# Patient Record
Sex: Female | Born: 1950
Health system: Southern US, Community
[De-identification: ages and names within clinical notes are randomized; demographics above are authoritative.]

## PROBLEM LIST (undated history)

## (undated) DIAGNOSIS — G5600 Carpal tunnel syndrome, unspecified upper limb: Secondary | ICD-10-CM

## (undated) DIAGNOSIS — M899 Disorder of bone, unspecified: Secondary | ICD-10-CM

## (undated) DIAGNOSIS — B009 Herpesviral infection, unspecified: Secondary | ICD-10-CM

## (undated) DIAGNOSIS — C880 Waldenstrom macroglobulinemia: Secondary | ICD-10-CM

## (undated) DIAGNOSIS — R05 Cough: Secondary | ICD-10-CM

## (undated) DIAGNOSIS — M949 Disorder of cartilage, unspecified: Secondary | ICD-10-CM

## (undated) DIAGNOSIS — L989 Disorder of the skin and subcutaneous tissue, unspecified: Secondary | ICD-10-CM

## (undated) DIAGNOSIS — R209 Unspecified disturbances of skin sensation: Secondary | ICD-10-CM

## (undated) HISTORY — DX: Waldenstrom macroglobulinemia: C88.0

## (undated) HISTORY — DX: Disorder of bone, unspecified: M89.9

## (undated) HISTORY — DX: Unspecified disturbances of skin sensation: R20.9

## (undated) HISTORY — DX: Carpal tunnel syndrome, unspecified upper limb: G56.00

## (undated) HISTORY — DX: Cough: R05

## (undated) HISTORY — DX: Disorder of cartilage, unspecified: M94.9

## (undated) HISTORY — DX: Herpesviral infection, unspecified: B00.9

## (undated) HISTORY — DX: Disorder of the skin and subcutaneous tissue, unspecified: L98.9

---
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
## 1986-12-30 HISTORY — PX: ORIF TIBIA & FIBULA FRACTURES: SHX2131

## 2001-10-07 ENCOUNTER — Encounter: Payer: Self-pay | Admitting: Internal Medicine

## 2003-03-10 ENCOUNTER — Other Ambulatory Visit: Admission: RE | Admit: 2003-03-10 | Discharge: 2003-03-10 | Payer: Self-pay | Admitting: *Deleted

## 2003-12-20 ENCOUNTER — Encounter: Payer: Self-pay | Admitting: Internal Medicine

## 2004-04-26 ENCOUNTER — Encounter: Payer: Self-pay | Admitting: Internal Medicine

## 2004-04-26 ENCOUNTER — Other Ambulatory Visit: Admission: RE | Admit: 2004-04-26 | Discharge: 2004-04-26 | Payer: Self-pay | Admitting: *Deleted

## 2005-06-03 ENCOUNTER — Other Ambulatory Visit: Admission: RE | Admit: 2005-06-03 | Discharge: 2005-06-03 | Payer: Self-pay | Admitting: Obstetrics and Gynecology

## 2006-02-18 ENCOUNTER — Ambulatory Visit: Payer: Self-pay | Admitting: Internal Medicine

## 2006-11-27 ENCOUNTER — Ambulatory Visit: Payer: Self-pay | Admitting: Internal Medicine

## 2006-12-16 ENCOUNTER — Encounter (INDEPENDENT_AMBULATORY_CARE_PROVIDER_SITE_OTHER): Payer: Self-pay | Admitting: *Deleted

## 2006-12-16 ENCOUNTER — Ambulatory Visit: Payer: Self-pay | Admitting: Gastroenterology

## 2006-12-16 ENCOUNTER — Encounter: Payer: Self-pay | Admitting: Internal Medicine

## 2007-01-06 ENCOUNTER — Emergency Department (HOSPITAL_COMMUNITY): Admission: EM | Admit: 2007-01-06 | Discharge: 2007-01-06 | Payer: Self-pay | Admitting: Emergency Medicine

## 2007-06-03 ENCOUNTER — Ambulatory Visit: Payer: Self-pay | Admitting: Internal Medicine

## 2008-10-27 ENCOUNTER — Encounter: Payer: Self-pay | Admitting: Internal Medicine

## 2008-11-03 ENCOUNTER — Encounter: Payer: Self-pay | Admitting: Internal Medicine

## 2008-11-09 ENCOUNTER — Encounter: Payer: Self-pay | Admitting: Internal Medicine

## 2008-12-05 ENCOUNTER — Ambulatory Visit: Payer: Self-pay | Admitting: Internal Medicine

## 2008-12-05 DIAGNOSIS — M899 Disorder of bone, unspecified: Secondary | ICD-10-CM

## 2008-12-05 DIAGNOSIS — G5600 Carpal tunnel syndrome, unspecified upper limb: Secondary | ICD-10-CM

## 2008-12-05 DIAGNOSIS — M949 Disorder of cartilage, unspecified: Secondary | ICD-10-CM

## 2008-12-05 HISTORY — DX: Carpal tunnel syndrome, unspecified upper limb: G56.00

## 2008-12-05 HISTORY — DX: Disorder of bone, unspecified: M89.9

## 2008-12-05 LAB — CONVERTED CEMR LAB
Albumin: 4.1 g/dL (ref 3.5–5.2)
Basophils Absolute: 0 10*3/uL (ref 0.0–0.1)
Bilirubin, Direct: 0.1 mg/dL (ref 0.0–0.3)
CO2: 28 meq/L (ref 19–32)
Calcium: 9.6 mg/dL (ref 8.4–10.5)
Cholesterol: 180 mg/dL (ref 0–200)
Eosinophils Absolute: 0 10*3/uL (ref 0.0–0.7)
Glucose, Bld: 80 mg/dL (ref 70–99)
HCT: 38 % (ref 36.0–46.0)
Hemoglobin: 12.9 g/dL (ref 12.0–15.0)
LDL Cholesterol: 102 mg/dL — ABNORMAL HIGH (ref 0–99)
Lymphocytes Relative: 36.5 % (ref 12.0–46.0)
Monocytes Relative: 10.2 % (ref 3.0–12.0)
Potassium: 4 meq/L (ref 3.5–5.1)
RDW: 13.2 % (ref 11.5–14.6)
Sodium: 142 meq/L (ref 135–145)
Total Bilirubin: 0.7 mg/dL (ref 0.3–1.2)
Total CHOL/HDL Ratio: 2.5
VLDL: 6 mg/dL (ref 0–40)
Vitamin B-12: 354 pg/mL (ref 211–911)

## 2009-02-24 ENCOUNTER — Telehealth: Payer: Self-pay | Admitting: Internal Medicine

## 2009-03-27 ENCOUNTER — Ambulatory Visit: Payer: Self-pay | Admitting: Internal Medicine

## 2009-08-16 ENCOUNTER — Ambulatory Visit: Payer: Self-pay | Admitting: Internal Medicine

## 2009-08-21 ENCOUNTER — Telehealth: Payer: Self-pay | Admitting: Internal Medicine

## 2009-08-24 LAB — CONVERTED CEMR LAB: Vit D, 25-Hydroxy: 26 ng/mL — ABNORMAL LOW (ref 30–89)

## 2009-09-01 ENCOUNTER — Ambulatory Visit: Payer: Self-pay | Admitting: Internal Medicine

## 2009-09-01 DIAGNOSIS — R209 Unspecified disturbances of skin sensation: Secondary | ICD-10-CM

## 2009-09-01 HISTORY — DX: Unspecified disturbances of skin sensation: R20.9

## 2009-09-01 LAB — CONVERTED CEMR LAB: Vit D, 25-Hydroxy: 36 ng/mL (ref 30–89)

## 2009-09-06 LAB — CONVERTED CEMR LAB
Basophils Absolute: 0 10*3/uL (ref 0.0–0.1)
Folate: 14 ng/mL
Lymphocytes Relative: 37.8 % (ref 12.0–46.0)
MCHC: 33.9 g/dL (ref 30.0–36.0)
Neutro Abs: 2.6 10*3/uL (ref 1.4–7.7)
Platelets: 280 10*3/uL (ref 150.0–400.0)
RDW: 13.5 % (ref 11.5–14.6)
TSH: 0.81 microintl units/mL (ref 0.35–5.50)

## 2009-12-01 ENCOUNTER — Ambulatory Visit: Payer: Self-pay | Admitting: Family Medicine

## 2009-12-01 DIAGNOSIS — J019 Acute sinusitis, unspecified: Secondary | ICD-10-CM | POA: Insufficient documentation

## 2009-12-01 LAB — CONVERTED CEMR LAB: Rapid Strep: NEGATIVE

## 2009-12-04 ENCOUNTER — Telehealth: Payer: Self-pay | Admitting: Family Medicine

## 2010-02-14 ENCOUNTER — Telehealth: Payer: Self-pay | Admitting: Internal Medicine

## 2010-03-01 ENCOUNTER — Ambulatory Visit: Payer: Self-pay | Admitting: Internal Medicine

## 2010-03-01 DIAGNOSIS — B009 Herpesviral infection, unspecified: Secondary | ICD-10-CM

## 2010-03-01 HISTORY — DX: Herpesviral infection, unspecified: B00.9

## 2010-04-11 ENCOUNTER — Ambulatory Visit: Payer: Self-pay | Admitting: Internal Medicine

## 2010-04-11 DIAGNOSIS — J309 Allergic rhinitis, unspecified: Secondary | ICD-10-CM | POA: Insufficient documentation

## 2010-11-09 ENCOUNTER — Telehealth: Payer: Self-pay | Admitting: Internal Medicine

## 2011-01-20 ENCOUNTER — Encounter: Payer: Self-pay | Admitting: Obstetrics and Gynecology

## 2011-01-29 NOTE — Progress Notes (Signed)
Summary: rash on buttocks- rx please  Phone Note Call from Patient   Caller: Patient Call For: Birdie Sons MD Reason for Call: Talk to Nurse Summary of Call: called taken from pt - has rash on buttocks that she has had before - recurrent . Dr. Cato Mulligan has rx'd cream or pill  in the past - ?fungal Thinks she got it when she was in a sauna.  [facility] call pt back at [phone] Initial call taken by: Duard Brady LPN,  November 09, 2010 3:17 PM  Follow-up for Phone Call        I don't know what cream she is talking about., with new medication. Otherwise office visit. Follow-up by: Birdie Sons MD,  November 11, 2010 7:01 PM  Additional Follow-up for Phone Call Additional follow up Details #1::        l/m on pts cell phone with directions Additional Follow-up by: Alfred Levins, CMA,  November 12, 2010 11:40 AM

## 2011-01-29 NOTE — Assessment & Plan Note (Signed)
Summary: ear block/pressure/cjr   Vital Signs:  Patient profile:   60 year old female Weight:      154 pounds Temp:     97.7 degrees F oral BP sitting:   122 / 80  (right arm) Cuff size:   regular  Vitals Entered By: Duard Brady LPN (April 11, 2010 10:46 AM) CC: c/o both ears 'blocked' , congestion , dry cough  Is Patient Diabetic? No   CC:  c/o both ears 'blocked' , congestion , and dry cough .  History of Present Illness:  60 year old patient with a one-week history fullness and pressure in the ears associated with some diminished auditory acuity.  There is a minimal rhinorrhea, chest congestion, and nonproductive cough.  There's been no fever.  She states that she often has this symptom complex.  After visiting grandchildren.  Symptoms are aggravated by business flights. she has a history of osteopenia, otherwise, her past medical history is fairly unremarkable  Allergies: 1)  ! Ciprofloxacin Hcl (Ciprofloxacin Hcl)  Past History:  Past Medical History: Reviewed history from 03/01/2010 and no changes required.  Osteopenia recurrent herpes simplex  Review of Systems       The patient complains of decreased hearing.  The patient denies anorexia, fever, weight loss, weight gain, vision loss, hoarseness, chest pain, syncope, dyspnea on exertion, peripheral edema, prolonged cough, headaches, hemoptysis, abdominal pain, melena, hematochezia, severe indigestion/heartburn, hematuria, incontinence, genital sores, muscle weakness, suspicious skin lesions, transient blindness, difficulty walking, depression, unusual weight change, abnormal bleeding, enlarged lymph nodes, angioedema, and breast masses.    Physical Exam  General:  Well-developed,well-nourished,in no acute distress; alert,appropriate and cooperative throughout examination Head:  Normocephalic and atraumatic without obvious abnormalities. No apparent alopecia or balding. Eyes:  No corneal or conjunctival  inflammation noted. EOMI. Perrla. Funduscopic exam benign, without hemorrhages, exudates or papilledema. Vision grossly normal. Ears:  minimal wax in the canals Nose:  External nasal examination shows no deformity or inflammation. Nasal mucosa are pink and moist without lesions or exudates. Mouth:  Oral mucosa and oropharynx without lesions or exudates.  Teeth in good repair. Neck:  No deformities, masses, or tenderness noted. Lungs:  Normal respiratory effort, chest expands symmetrically. Lungs are clear to auscultation, no crackles or wheezes.   Impression & Recommendations:  Problem # 1:  RHINITIS (ICD-477.9)  Her updated medication list for this problem includes:    Fluticasone Propionate 50 Mcg/act Susp (Fluticasone propionate) ..... Used intranasally daily  Her updated medication list for this problem includes:    Fluticasone Propionate 50 Mcg/act Susp (Fluticasone propionate) ..... Used intranasally daily  Problem # 2:  OSTEOPENIA (ICD-733.90)  Complete Medication List: 1)  Centrum Silver Tabs (Multiple vitamins-minerals) .... Once daily 2)  [facility] Cal[facility]-vitamin D 500-200 Mg-unit Tabs (Calcium carbonate-vitamin d) .... Once daily 3)  Smz-tmp Ds 800-160 Mg Tabs (Sulfamethoxazole-trimethoprim) .... One twice daily 4)  Prednisone 20 Mg Tabs (Prednisone) .... One twice daily 5)  Fluticasone Propionate 50 Mcg/act Susp (Fluticasone propionate) .... Used intranasally daily 6)  Fexofenadine-pseudoephedrine 60-120 Mg Xr12h-tab (Fexofenadine-pseudoephedrine) .... One twice daily 7)  Sulfacetamide Sodium 10 % Soln (Sulfacetamide sodium) .... 2 gtts four times daily  Patient Instructions: 1)  Please schedule a follow-up appointment as needed. 2)  Take calcium +Vitamin D daily. Prescriptions: SULFACETAMIDE SODIUM 10 % SOLN (SULFACETAMIDE SODIUM) 2 gtts four times daily  #10 cc x 0   Entered and Authorized by:   Gordy Savers  MD   Signed by:   Gordy Savers  MD on 04/11/2010    Method used:   Print then Give to Patient   RxID:   [id number] FEXOFENADINE-PSEUDOEPHEDRINE 60-120 MG XR12H-TAB (FEXOFENADINE-PSEUDOEPHEDRINE) one twice daily  #20 x 4   Entered and Authorized by:   Gordy Savers  MD   Signed by:   Gordy Savers  MD on 04/11/2010   Method used:   [phone]       [address]       [place], [place]  [address]       Ph: [phone] or [phone]       Fax: [phone]   RxID:   [id number] FLUTICASONE PROPIONATE 50 MCG/ACT SUSP (FLUTICASONE PROPIONATE) used intranasally daily  #1 x 4   Entered and Authorized by:   Gordy Savers  MD   Signed by:   Gordy Savers  MD on 04/11/2010   Method used:   [facility]        [facility] [phone] ([facility])       [address] [place], [place]  [address]       Ph: [phone] or [phone]       Fax: [phone]   RxID:   [id number] PREDNISONE 20 MG TABS (PREDNISONE) one twice daily  #14 x 0   Entered and Authorized by:   Gordy Savers  MD   Signed by:   Gordy Savers  MD on 04/11/2010   Method used:   [facility]        [phone][facility] ([facility]ail[facility]       [address] [place], [place]  [address]       Ph: [phone] or [phone]       Fax: [phone]   RxID:   [id number]  And

## 2011-01-29 NOTE — Progress Notes (Signed)
Summary: tetanus question  Phone Note Call from Patient Call back at [phone]   Caller: Patient---live call Summary of Call: wants the nurse to call her back with her tetanus info. has ? Initial call taken by: Warnell Forester,  February 14, 2010 1:08 PM  Follow-up for Phone Call        Left message on machine. Pt to call back. Gladis Riffle, RN  February 14, 2010 2:16 PM   Checked paper chart and EMR and she is due tetanus according to those records.  Unable to reach pt and had left message to call back earlier today. Follow-up by: Gladis Riffle, RN,  February 14, 2010 4:22 PM  Additional Follow-up for Phone Call Additional follow up Details #1::        Told pt she is due tetanus at next visit unless she needs it sooner due to injury via voice mail message. Additional Follow-up by: Gladis Riffle, RN,  February 15, 2010 2:31 PM

## 2011-01-29 NOTE — Assessment & Plan Note (Signed)
Summary: rash/njr   Vital Signs:  Patient profile:   60 year old female Weight:      158 pounds Temp:     98.2 degrees F oral BP sitting:   110 / 70  (right arm) Cuff size:   regular  Vitals Entered By: Duard Brady LPN (March 02, 2951 12:54 PM) CC: travelling  out of country - immunizations? , rash on buttocks , cold sore on lip , would like abx to travel with   CC:  travelling  out of country - immunizations? , rash on buttocks , cold sore on lip , and would like abx to travel with.  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  She requires an immunization update.  she is unclear of her immunization history, which may be in the hands of her gynecologist.  Her EMR, and paper chart.  Reviewed  Allergies: 1)  ! Ciprofloxacin Hcl (Ciprofloxacin Hcl)  Past History:  Past Medical History:  Osteopenia recurrent herpes simplex  Physical Exam  General:  Well-developed,well-nourished,in no acute distress; alert,appropriate and cooperative throughout examination; normal blood pressure Skin:  resolving herpetic lesion, left buttock area   Impression & Recommendations:  Problem # 1:  PREVENTIVE HEALTH CARE (ICD-V70.0)  Problem # 2:  HSV (ICD-054.9)  Complete Medication List: 1)  Centrum Silver Tabs (Multiple vitamins-minerals) .... Once daily 2)  [facility]vitamin [facility] 500-200 Mg-unit Tabs (Calcium carbonate-vitamin d) .... Once daily 3)  Smz-tmp Ds 800-160 Mg Tabs (Sulfamethoxazole-trimethoprim) .... One twice daily  Other Orders: Hepatitis B Vaccine >6yrs (84132) Admin 1st Vaccine (44010)  Patient Instructions: 1)  Please schedule a follow-up appointment as needed. Prescriptions: SMZ-TMP DS 800-160 MG TABS (SULFAMETHOXAZOLE-TRIMETHOPRIM) one twice daily  #20 x 0   Entered and Authorized by:   Gordy Savers  MD   Signed by:   Gordy Savers  MD on 03/01/2010   Method used:   Print  then Give to Patient   RxID:   [id number]   Prevention & Chronic Care Immunizations   Influenza vaccine: Not documented    Tetanus booster: Not documented    Pneumococcal vaccine: Not documented    H. zoster vaccine: Not documented  Colorectal Screening   Hemoccult: given  (12/20/2003)   Hemoccult due: 12/19/2004    Colonoscopy: polyp  (12/16/2006)   Colonoscopy due: 12/17/2011  Other Screening   Pap smear: normal-pt's report  (10/30/2008)   Pap smear due: 10/30/2009    Mammogram: normal  (11/09/2008)   Mammogram due: 11/09/2009    DXA bone density scan: osteopenia (-1.1)  (11/03/2008)   DXA scan due: 10/2010    Smoking status: never  (12/05/2008)  Lipids   Total Cholesterol: 180  (12/05/2008)   LDL: 102  (12/05/2008)   LDL Direct: Not documented   HDL: 72.1  (12/05/2008)   Triglycerides: 28  (12/05/2008)   Immunizations Administered:  Hepatitis B Vaccine # 3:    Vaccine Type: HepB Adult    Site: left deltoid    Mfr: GlaxoSmithKline    Dose: 1.0 ml    Route: IM    Given by: Duard Brady LPN    Exp. Date: 03/02/2011    Lot #: [id number]    VIS given: 07/16/06 version given March 01, 2010.    Physician counseled: yes

## 2011-10-04 ENCOUNTER — Ambulatory Visit (INDEPENDENT_AMBULATORY_CARE_PROVIDER_SITE_OTHER): Payer: Managed Care, Other (non HMO) | Admitting: Internal Medicine

## 2011-10-04 ENCOUNTER — Encounter: Payer: Self-pay | Admitting: Internal Medicine

## 2011-10-04 ENCOUNTER — Ambulatory Visit (INDEPENDENT_AMBULATORY_CARE_PROVIDER_SITE_OTHER)
Admission: RE | Admit: 2011-10-04 | Discharge: 2011-10-04 | Disposition: A | Discharge status: Home / Self Care | Payer: Managed Care, Other (non HMO) | Source: Ambulatory Visit | Attending: Internal Medicine | Admitting: Internal Medicine

## 2011-10-04 ENCOUNTER — Other Ambulatory Visit: Payer: Self-pay | Admitting: Internal Medicine

## 2011-10-04 VITALS — BP 118/80 | Temp 99.2°F | Wt 149.0 lb

## 2011-10-04 DIAGNOSIS — J069 Acute upper respiratory infection, unspecified: Secondary | ICD-10-CM

## 2011-10-04 IMAGING — CR DG CHEST 2V
2 series · 2 of 2 positions shown · non-contrast
Comparison: None.

CLINICAL DATA: Cough, congestion

CHEST - 2 VIEW

[view not recorded (1 of 2)]
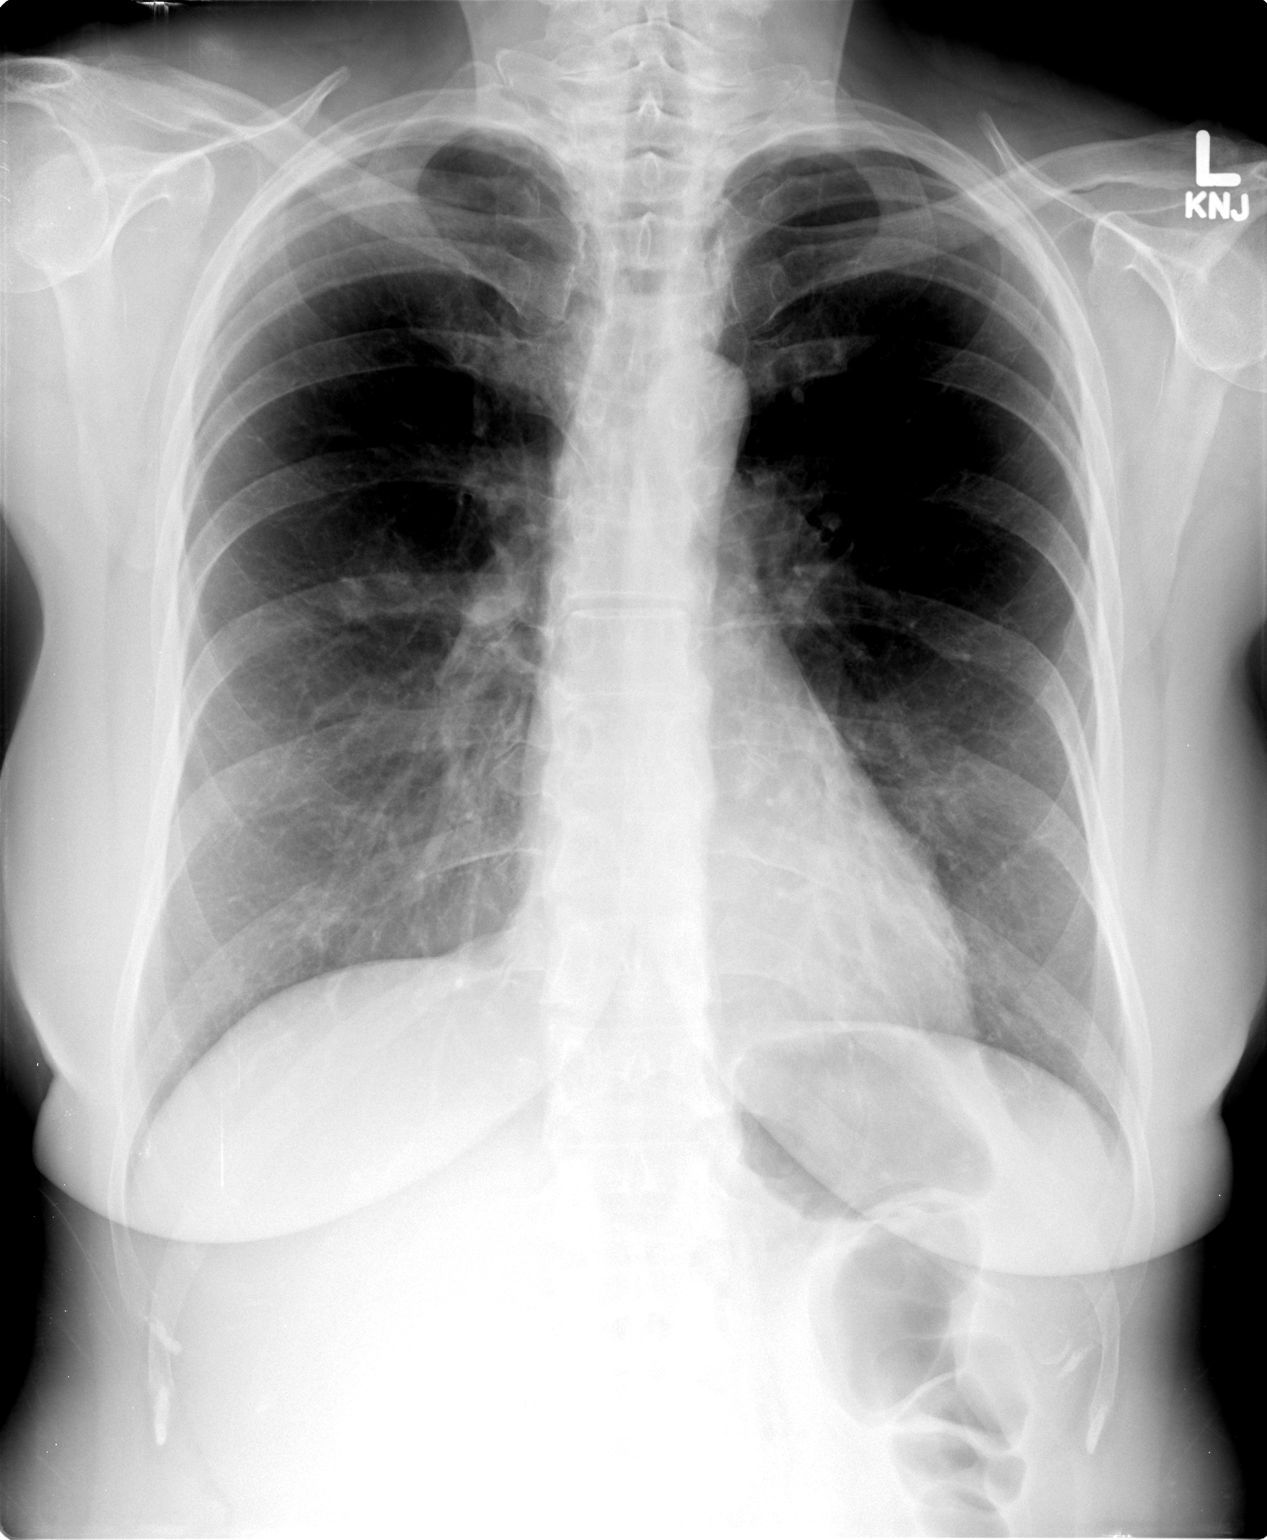

[view not recorded (2 of 2)]
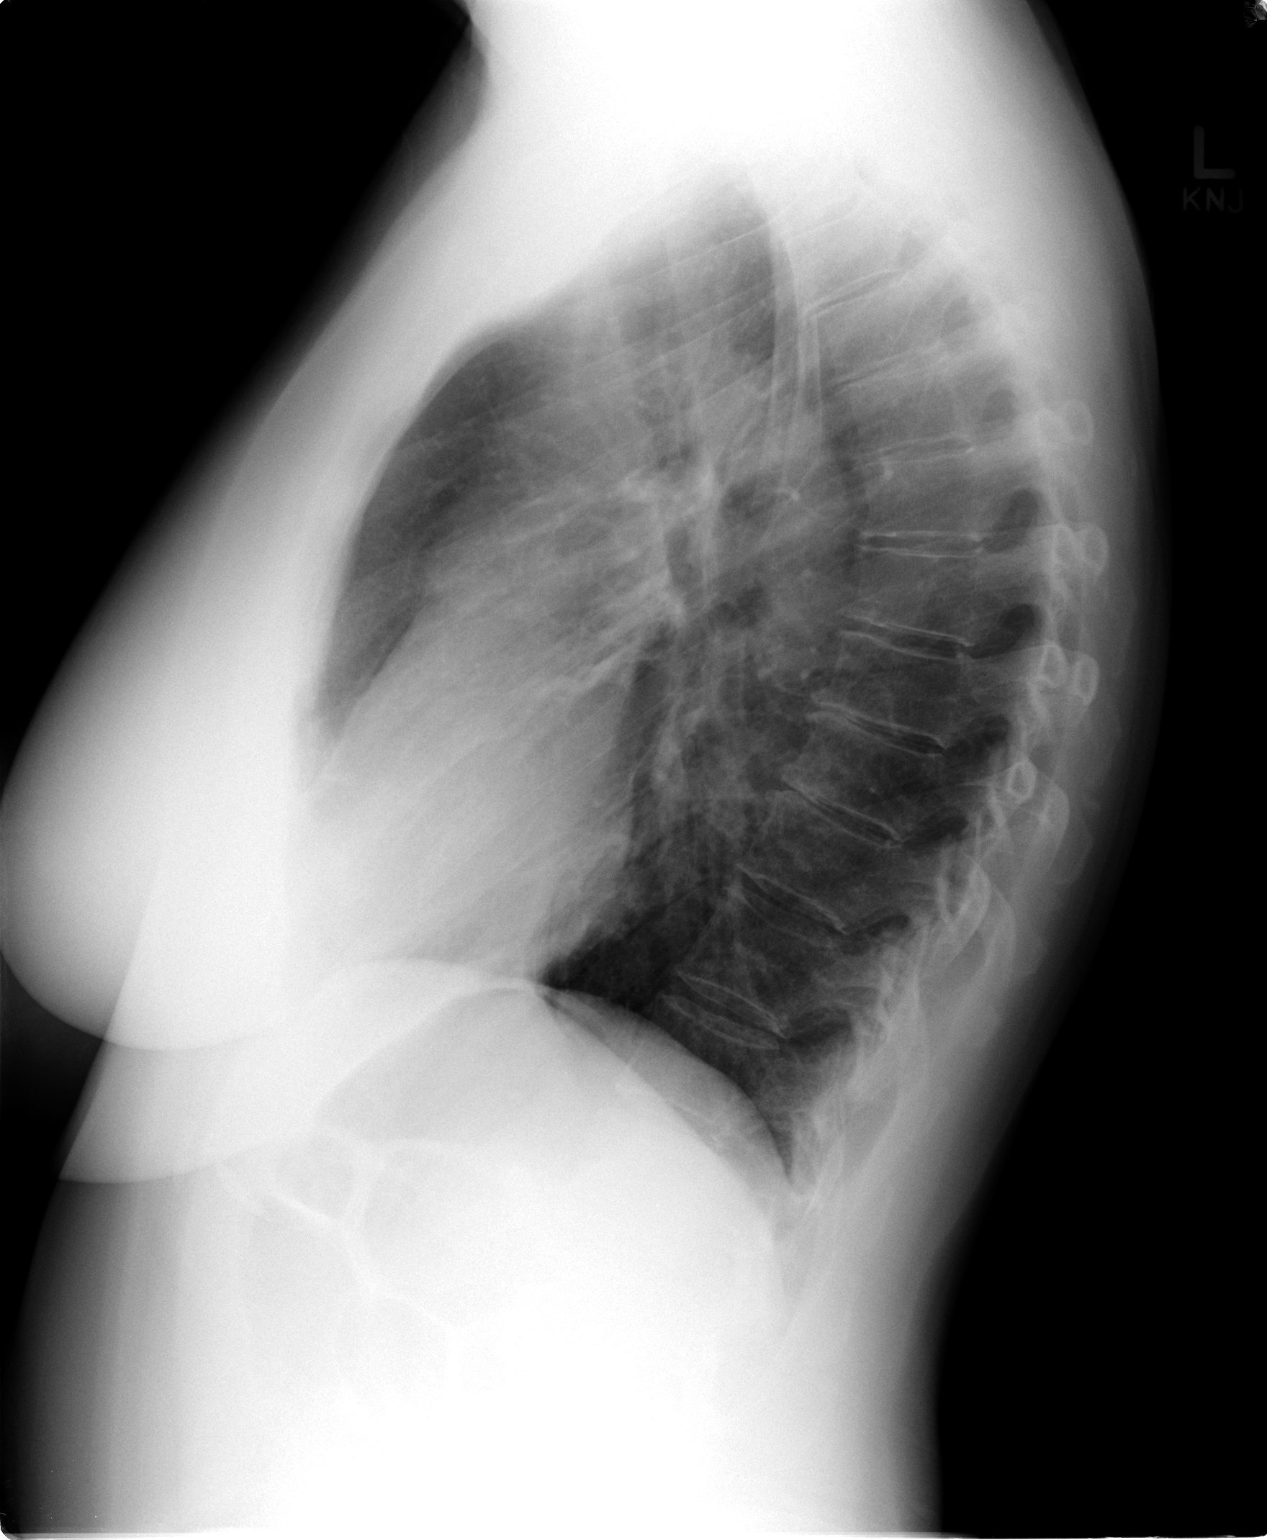

[2 of 2 positions shown; findings below may reference images not displayed]

FINDINGS: No definite pneumonia is seen, but there are minimally
prominent markings in the mid right lung field.  Patchy pneumonia
is difficult to exclude and follow up chest x-ray is recommended.
No effusion is seen.  Mediastinal contours are normal.  The heart
is within normal limits in size.  No acute bony abnormality is
seen.
IMPRESSION: Minimally prominent markings in the right mid lung field. Recommend
follow-up chest x-ray to exclude pneumonia.

## 2011-10-04 MED ORDER — HYDROCODONE-HOMATROPINE 5-1.5 MG/5ML PO SYRP: 5.0000 mL | ORAL_SOLUTION | Freq: Four times a day (QID) | ORAL | Status: AC | PRN

## 2011-10-04 NOTE — Progress Notes (Signed)
  Subjective:    Patient ID: Paula [name], female    DOB: 1951-07-29, 60 y.o.   MRN: [id number]  HPI  60 year old patient who is seen today with a six-day history of largely dry nonproductive cough she has had some fatigue and rare chills. She is very much concerned about recurrent upper spikes for tract infections. She states that she has episodes that occur once or twice per year. She does work and she often stays with several employees. At the present time her husband has a similar illness. Denies any chest pain shortness of breath wheezing or purulent sputum production. She is requesting a chest x-ray to rule out pneumonia. She is a lifelong nonsmoker   Review of Systems  Constitutional: Positive for fever, chills and fatigue.  HENT: Positive for congestion. Negative for hearing loss, sore throat, rhinorrhea, dental problem, sinus pressure and tinnitus.   Eyes: Negative for pain, discharge and visual disturbance.  Respiratory: Positive for cough. Negative for shortness of breath.   Cardiovascular: Negative for chest pain, palpitations and leg swelling.  Gastrointestinal: Negative for nausea, vomiting, abdominal pain, diarrhea, constipation, blood in stool and abdominal distention.  Genitourinary: Negative for dysuria, urgency, frequency, hematuria, flank pain, vaginal bleeding, vaginal discharge, difficulty urinating, vaginal pain and pelvic pain.  Musculoskeletal: Negative for joint swelling, arthralgias and gait problem.  Skin: Negative for rash.  Neurological: Negative for dizziness, syncope, speech difficulty, weakness, numbness and headaches.  Hematological: Negative for adenopathy.  Psychiatric/Behavioral: Negative for behavioral problems, dysphoric mood and agitation. The patient is not nervous/anxious.        Objective:   Physical Exam  Constitutional: She is oriented to person, place, and time. She appears well-developed and well-nourished. No distress.       Appears unwell  temperature 99.2. No acute distress  HENT:  Head: Normocephalic.  Right Ear: External ear normal.  Left Ear: External ear normal.  Mouth/Throat: Oropharynx is clear and moist.  Eyes: Conjunctivae and EOM are normal. Pupils are equal, round, and reactive to light.  Neck: Normal range of motion. Neck supple. No thyromegaly present.  Cardiovascular: Normal rate, regular rhythm, normal heart sounds and intact distal pulses.   Pulmonary/Chest: Effort normal and breath sounds normal.       O2 saturation 97% Pulse rate 78  Abdominal: Soft. Bowel sounds are normal. She exhibits no mass. There is no tenderness.  Musculoskeletal: Normal range of motion.  Lymphadenopathy:    She has no cervical adenopathy.  Neurological: She is alert and oriented to person, place, and time.  Skin: Skin is warm and dry. No rash noted.  Psychiatric: She has a normal mood and affect. Her behavior is normal.          Assessment & Plan:    Viral URI. Will treat symptomatically. We'll obtain a chest x-ray per patient request for reassurance and to rule out an unlikely pneumonia

## 2011-10-04 NOTE — Patient Instructions (Signed)
Get plenty of rest, Drink lots of  clear liquids, and use Tylenol or ibuprofen for fever and discomfort.    Call or return to clinic prn if these symptoms worsen or fail to improve as anticipated.  

## 2011-10-07 ENCOUNTER — Telehealth: Payer: Self-pay | Admitting: Internal Medicine

## 2011-10-07 NOTE — Telephone Encounter (Signed)
Pt  Requesting results of chest x-ray- pt request you to leave detailed message if she does not answer

## 2011-10-07 NOTE — Telephone Encounter (Signed)
Please advise 

## 2011-10-07 NOTE — Telephone Encounter (Signed)
i left message.

## 2011-10-07 NOTE — Telephone Encounter (Signed)
Pt called back to check on status of Chest Xray results. Pt was informed that nurse left a message, but pt said that no vm was lft on cell. Pt req that nurse call on work # [phone].

## 2011-10-08 ENCOUNTER — Ambulatory Visit (INDEPENDENT_AMBULATORY_CARE_PROVIDER_SITE_OTHER): Payer: Managed Care, Other (non HMO) | Admitting: Internal Medicine

## 2011-10-08 ENCOUNTER — Encounter: Payer: Self-pay | Admitting: Internal Medicine

## 2011-10-08 VITALS — BP 118/70 | Temp 98.0°F | Wt 150.0 lb

## 2011-10-08 DIAGNOSIS — J069 Acute upper respiratory infection, unspecified: Secondary | ICD-10-CM

## 2011-10-08 NOTE — Progress Notes (Signed)
  Subjective:    Patient ID: Paula [name], female    DOB: 1951/10/31, 60 y.o.   MRN: [id number]  HPI   60 year old patient who is seen today for followup. She was seen recently for a viral URI and has been treated symptomatically. At her request a chest x-ray was performed last week revealed some bronchitic changes only. She states that she still feels poorly she has some headache fatigue sore throat and mild nonproductive cough. She is a nonsmoker no fever or chills shortness of breath wheezing or chest pain    Review of Systems  Constitutional: Negative.   HENT: Positive for congestion and voice change. Negative for hearing loss, sore throat, rhinorrhea, dental problem, sinus pressure and tinnitus.   Eyes: Negative for pain, discharge and visual disturbance.  Respiratory: Positive for cough. Negative for shortness of breath.   Cardiovascular: Negative for chest pain, palpitations and leg swelling.  Gastrointestinal: Negative for nausea, vomiting, abdominal pain, diarrhea, constipation, blood in stool and abdominal distention.  Genitourinary: Negative for dysuria, urgency, frequency, hematuria, flank pain, vaginal bleeding, vaginal discharge, difficulty urinating, vaginal pain and pelvic pain.  Musculoskeletal: Negative for joint swelling, arthralgias and gait problem.  Skin: Negative for rash.  Neurological: Negative for dizziness, syncope, speech difficulty, weakness, numbness and headaches.  Hematological: Negative for adenopathy.  Psychiatric/Behavioral: Negative for behavioral problems, dysphoric mood and agitation. The patient is not nervous/anxious.        Objective:   Physical Exam  Constitutional: She is oriented to person, place, and time. She appears well-developed and well-nourished.  HENT:  Head: Normocephalic.  Right Ear: External ear normal.  Left Ear: External ear normal.  Mouth/Throat: Oropharynx is clear and moist.  Eyes: Conjunctivae and EOM are normal. Pupils are  equal, round, and reactive to light.  Neck: Normal range of motion. Neck supple. No thyromegaly present.  Cardiovascular: Normal rate, regular rhythm, normal heart sounds and intact distal pulses.   Pulmonary/Chest: Effort normal and breath sounds normal. No respiratory distress. She has no wheezes. She has no rales.       O2 saturation 90%. Pulse rate 63  Abdominal: Soft. Bowel sounds are normal. She exhibits no mass. There is no tenderness.  Musculoskeletal: Normal range of motion.  Lymphadenopathy:    She has no cervical adenopathy.  Neurological: She is alert and oriented to person, place, and time.  Skin: Skin is warm and dry. No rash noted.  Psychiatric: She has a normal mood and affect. Her behavior is normal.          Assessment & Plan:   Viral URI. Will continue symptomatic treatment;  the natural history and prognosis discussed at length. Her chest x-ray report was discussed. All questions were answered.

## 2011-10-08 NOTE — Patient Instructions (Signed)
Get plenty of rest, Drink lots of  clear liquids, and use Tylenol or ibuprofen for fever and discomfort.    Call or return to clinic prn if these symptoms worsen or fail to improve as anticipated.  

## 2011-12-17 ENCOUNTER — Encounter: Payer: Self-pay | Admitting: Gastroenterology

## 2012-02-03 ENCOUNTER — Telehealth: Payer: Self-pay | Admitting: *Deleted

## 2012-02-03 NOTE — Telephone Encounter (Signed)
Pt calls c/o rash on left buttock that she has had in the past- requesting antibioitc like she has had int he past- topical ointments dont help--[facility]

## 2012-02-04 NOTE — Telephone Encounter (Signed)
Scheduled appt.

## 2012-02-04 NOTE — Telephone Encounter (Signed)
Left message to call back for appt

## 2012-02-04 NOTE — Telephone Encounter (Signed)
OV with available provider

## 2012-02-05 ENCOUNTER — Encounter: Payer: Self-pay | Admitting: Family

## 2012-02-05 ENCOUNTER — Ambulatory Visit (INDEPENDENT_AMBULATORY_CARE_PROVIDER_SITE_OTHER): Payer: BC Managed Care – PPO | Admitting: Family Medicine

## 2012-02-05 ENCOUNTER — Ambulatory Visit (INDEPENDENT_AMBULATORY_CARE_PROVIDER_SITE_OTHER): Payer: BC Managed Care – PPO | Admitting: Family

## 2012-02-05 VITALS — BP 118/70 | Temp 98.2°F | Wt 153.0 lb

## 2012-02-05 DIAGNOSIS — L739 Follicular disorder, unspecified: Secondary | ICD-10-CM

## 2012-02-05 DIAGNOSIS — L738 Other specified follicular disorders: Secondary | ICD-10-CM

## 2012-02-05 DIAGNOSIS — L299 Pruritus, unspecified: Secondary | ICD-10-CM

## 2012-02-05 MED ORDER — DOXYCYCLINE HYCLATE 100 MG PO TABS: 100.0000 mg | ORAL_TABLET | Freq: Two times a day (BID) | ORAL | Status: AC

## 2012-02-05 NOTE — Patient Instructions (Addendum)
Folliculitis      Folliculitis is an infection and inflammation of the hair follicles. Hair follicles become red and irritated. This inflammation is usually caused by bacteria. The bacteria thrive in warm, moist environments. This condition can be seen anywhere on the body.   CAUSES  The most common cause of folliculitis is an infection by germs (bacteria). Fungal and viral infections can also cause the condition. Viral infections may be more common in people whose bodies are unable to fight disease well (weakened immune systems). Examples include people with:  · AIDS.   · An organ transplant.   · Cancer.   People with depressed immune systems, diabetes, or obesity, have a greater risk of getting folliculitis than the general population. Certain chemicals, especially oils and tars, also can cause folliculitis.  SYMPTOMS  · An early sign of folliculitis is a small, white or yellow pus-filled, itchy lesion (pustule). These lesions appear on a red, inflamed follicle. They are usually less than 5 mm (.20 inches).   · The most likely starting points are the scalp, thighs, legs, back and buttocks. Folliculitis is also frequently found in areas of repeated shaving.   · When an infection of the follicle goes deeper, it becomes a boil or furuncle. A group of closely packed boils create a larger lesion (a carbuncle). These sores (lesions) tend to occur in hairy, sweaty areas of the body.   TREATMENT   · A doctor who specializes in skin problems (dermatologists) treats mild cases of folliculitis with antiseptic washes.   · They also use a skin application which kills germs (topical antibiotics). Tea tree oil is a good topical antiseptic as well. It can be found at a health food store. A small percentage of individuals may develop an allergy to the tea tree oil.   · Mild to moderate boils respond well to warm water compresses applied three times daily.   · In some cases, oral antibiotics should be taken with the skin treatment.    · If lesions contain large quantities of pus or fluid, your caregiver may drain them. This allows the topical antibiotics to get to the affected areas better.   · Stubborn cases of folliculitis may respond to laser hair removal. This process uses a high intensity light beam (a laser) to destroy the follicle and reduces the scarring from folliculitis. After laser hair removal, hair will no longer grow in the laser treated area.   Patients with long-lasting folliculitis need to find out where the infection is coming from. Germs can live in the nostrils of the patient. This can trigger an outbreak now and then. Sometimes the bacteria live in the nostrils of a family member. This person does not develop the disorder but they repeatedly re-expose others to the germ. To break the cycle of recurrence in the patient, the family member must also undergo treatment.  PREVENTION   · Individuals who are predisposed to folliculitis should be extremely careful about personal hygiene.   · Application of antiseptic washes may help prevent recurrences.   · A topical antibiotic cream, mupirocin (Bactroban®), has been effective at reducing bacteria in the nostrils. It is applied inside the nose with your little finger. This is done twice daily for a week. Then it is repeated every 6 months.   · Because follicle disorders tend to come back, patients must receive follow-up care. Your caregiver may be able to recognize a recurrence before it becomes severe.   SEEK IMMEDIATE MEDICAL CARE   IF:   · You develop redness, swelling, or increasing pain in the area.   · You have a fever.   · You are not improving with treatment or are getting worse.   · You have any other questions or concerns.   Document Released: 02/24/2002 Document Revised: 08/28/2011 Document Reviewed: 12/21/2008  [employer]® Patient Information ©2012 [employer].

## 2012-02-05 NOTE — Progress Notes (Signed)
  Subjective:    Patient ID: Paula [name], female    DOB: 11/17/1951, 61 y.o.   MRN: [id number]  Rash This is a new (Patient reports having this rash about a year and a half ago from the tanning bed.) problem. The current episode started in the past 7 days. The problem is unchanged. The affected locations include the left buttock. The rash is characterized by itchiness and redness. Associated with: Tanning bed. Pertinent negatives include no fatigue, fever, joint pain or shortness of breath. Past treatments include topical steroids. The treatment provided no relief.      Review of Systems  Constitutional: Negative.  Negative for fever and fatigue.  Respiratory: Negative.  Negative for shortness of breath.   Cardiovascular: Negative.   Musculoskeletal: Negative for joint pain.  Skin: Positive for rash.       Rare rash on her left buttocks  Neurological: Negative.   Hematological: Negative.   Psychiatric/Behavioral: Negative.        Objective:   Physical Exam  Constitutional: She is oriented to person, place, and time. She appears well-developed and well-nourished.  Neck: Normal range of motion. Neck supple.  Cardiovascular: Normal rate, regular rhythm and normal heart sounds.   Pulmonary/Chest: Effort normal and breath sounds normal.  Neurological: She is alert and oriented to person, place, and time.  Skin: Skin is warm and dry. Rash noted.       Branch, excoriated rash noted to the left buttocks. No drainage or discharge noted. Minimally tender to touch.  Psychiatric: She has a normal mood and affect.          Assessment & Plan:  Assessment: Folliculitis, pruritus  Plan: Doxycycline 100 mg twice daily x10 days. Patient to call the office if symptoms worsen or persist, recheck as scheduled and when necessary.

## 2012-06-02 ENCOUNTER — Encounter: Payer: Self-pay | Admitting: Gastroenterology

## 2012-06-25 ENCOUNTER — Encounter: Payer: Self-pay | Admitting: Gastroenterology

## 2012-06-25 ENCOUNTER — Ambulatory Visit (AMBULATORY_SURGERY_CENTER): Payer: BC Managed Care – PPO | Admitting: *Deleted

## 2012-06-25 VITALS — Ht 65.0 in | Wt 153.3 lb

## 2012-06-25 DIAGNOSIS — Z1211 Encounter for screening for malignant neoplasm of colon: Secondary | ICD-10-CM

## 2012-06-25 MED ORDER — MOVIPREP 100 G PO SOLR: ORAL | Status: DC

## 2012-06-25 NOTE — Progress Notes (Signed)
No allergies to eggs or soybeans 

## 2012-07-09 ENCOUNTER — Encounter: Payer: Self-pay | Admitting: Gastroenterology

## 2012-07-09 ENCOUNTER — Ambulatory Visit (AMBULATORY_SURGERY_CENTER): Payer: BC Managed Care – PPO | Admitting: Gastroenterology

## 2012-07-09 VITALS — BP 133/66 | HR 78 | Temp 95.5°F | Resp 20 | Ht 65.0 in | Wt 153.0 lb

## 2012-07-09 DIAGNOSIS — Z860100 Personal history of colon polyps, unspecified: Secondary | ICD-10-CM

## 2012-07-09 DIAGNOSIS — Z1211 Encounter for screening for malignant neoplasm of colon: Secondary | ICD-10-CM

## 2012-07-09 MED ORDER — SODIUM CHLORIDE 0.9 % IV SOLN: 500.0000 mL | INTRAVENOUS | Status: DC

## 2012-07-09 NOTE — Progress Notes (Signed)
Patient did not experience any of the following events: a burn prior to discharge; a fall within the facility; wrong site/side/patient/procedure/implant event; or a hospital transfer or hospital admission upon discharge from the facility. (G8907) Patient did not have preoperative order for IV antibiotic SSI prophylaxis. (G8918)  

## 2012-07-09 NOTE — Patient Instructions (Addendum)
Normal Colonoscopy - Repeat colonoscopy in 10 years.  YOU HAD AN ENDOSCOPIC PROCEDURE TODAY AT THE [facility]: Refer to the procedure report that was given to you for any specific questions about what was found during the examination.  If the procedure report does not answer your questions, please call your gastroenterologist to clarify.  If you requested that your care partner not be given the details of your procedure findings, then the procedure report has been included in a sealed envelope for you to review at your convenience later.  YOU SHOULD EXPECT: Some feelings of bloating in the abdomen. Passage of more gas than usual.  Walking can help get rid of the air that was put into your GI tract during the procedure and reduce the bloating. If you had a lower endoscopy (such as a colonoscopy or flexible sigmoidoscopy) you may notice spotting of blood in your stool or on the toilet paper. If you underwent a bowel prep for your procedure, then you may not have a normal bowel movement for a few days.  DIET: Your first meal following the procedure should be a light meal and then it is ok to progress to your normal diet.  A half-sandwich or bowl of soup is an example of a good first meal.  Heavy or fried foods are harder to digest and may make you feel nauseous or bloated.  Likewise meals heavy in dairy and vegetables can cause extra gas to form and this can also increase the bloating.  Drink plenty of fluids but you should avoid alcoholic beverages for 24 hours.  ACTIVITY: Your care partner should take you home directly after the procedure.  You should plan to take it easy, moving slowly for the rest of the day.  You can resume normal activity the day after the procedure however you should NOT DRIVE or use heavy machinery for 24 hours (because of the sedation medicines used during the test).    SYMPTOMS TO REPORT IMMEDIATELY: A gastroenterologist can be reached at any hour.  During normal  business hours, 8:30 AM to 5:00 PM Monday through Friday, call [phone].  After hours and on weekends, please call the GI answering service at [phone] who will take a message and have the physician on call contact you.   Following lower endoscopy (colonoscopy or flexible sigmoidoscopy):  Excessive amounts of blood in the stool  Significant tenderness or worsening of abdominal pains  Swelling of the abdomen that is new, acute  Fever of 100F or higher  Following upper endoscopy (EGD)  Vomiting of blood or coffee ground material  New chest pain or pain under the shoulder blades  Painful or persistently difficult swallowing  New shortness of breath  Fever of 100F or higher  Black, tarry-looking stools  FOLLOW UP: If any biopsies were taken you will be contacted by phone or by letter within the next 1-3 weeks.  Call your gastroenterologist if you have not heard about the biopsies in 3 weeks.  Our staff will call the home number listed on your records the next business day following your procedure to check on you and address any questions or concerns that you may have at that time regarding the information given to you following your procedure. This is a courtesy call and so if there is no answer at the home number and we have not heard from you through the emergency physician on call, we will assume that you have returned to your regular daily  activities without incident.  SIGNATURES/CONFIDENTIALITY: You and/or your care partner have signed paperwork which will be entered into your electronic medical record.  These signatures attest to the fact that that the information above on your After Visit Summary has been reviewed and is understood.  Full responsibility of the confidentiality of this discharge information lies with you and/or your care-partner.   Please follow all discharge instructions given to you by the recovery room nurse. If you have any questions or problems after  discharge please call one of the numbers listed above. You will receive a phone call in the am to see how you are doing and answer any questions you may have. Thank you for choosing [facility] for your health care needs.

## 2012-07-09 NOTE — Op Note (Signed)
[facility] [address] [place], [place]  [address]  COLONOSCOPY PROCEDURE REPORT  PATIENT:  Paula, [name]  MR#:  [id number] BIRTHDATE:  26-Sep-1951, 60 yrs. old  GENDER:  female ENDOSCOPIST:  Barbette Hair. Arlyce Dice, MD REF. BY: PROCEDURE DATE:  07/09/2012 PROCEDURE:  Colonoscopy 62[address] ASA CLASS:  Class II INDICATIONS:  Screening, history of pre-cancerous (adenomatous) colon polyps Index polypectomy 2007 MEDICATIONS:   MAC sedation, administered by CRNA propofol 450mg IV  DESCRIPTION OF PROCEDURE:   After the risks benefits and alternatives of the procedure were thoroughly explained, informed consent was obtained.  Digital rectal exam was performed and revealed no abnormalities.   The LB [id number] endoscope was introduced through the anus and advanced to the cecum, which was identified by both the appendix and ileocecal valve, without limitations.  The quality of the prep was excellent, using MiraLax.  The instrument was then slowly withdrawn as the colon was fully examined. <<PROCEDUREIMAGES>>  FINDINGS:  A normal appearing cecum, ileocecal valve, and appendiceal orifice were identified. The ascending, hepatic flexure, transverse, splenic flexure, descending, sigmoid colon, and rectum appeared unremarkable (see image3, image4, and image5). Retroflexed views in the rectum revealed no abnormalities.    The time to cecum =  1) 7.25  minutes. The scope was then withdrawn in 1) 7.50  minutes from the cecum and the procedure completed. COMPLICATIONS:  None ENDOSCOPIC IMPRESSION:  RECOMMENDATIONS: 1) Colonoscopy 10 years REPEAT EXAM:  In 10 year(s) for Colonoscopy.  ______________________________ Barbette Hair. Arlyce Dice, MD  CC:  Lindley Magnus, MD  n. Rosalie DoctorBarbette Hair. Deundra Furber at 07/09/2012 11:19 AM  Tamera Stands, [phone]

## 2012-07-10 ENCOUNTER — Telehealth: Payer: Self-pay

## 2012-07-10 NOTE — Telephone Encounter (Signed)
  Follow up Call-  Call back number 07/09/2012  Post procedure Call Back phone  # [phone]  Permission to leave phone message Yes     Patient questions:  Do you have a fever, pain , or abdominal swelling? no Pain Score  0 *  Have you tolerated food without any problems? yes  Have you been able to return to your normal activities? yes  Do you have any questions about your discharge instructions: Diet   no Medications  no Follow up visit  no  Do you have questions or concerns about your Care? no  Actions: * If pain score is 4 or above: No action needed, pain <4.

## 2013-01-13 ENCOUNTER — Telehealth: Payer: Self-pay | Admitting: Internal Medicine

## 2013-01-13 NOTE — Telephone Encounter (Signed)
Patient Information:  Caller Name: Ericka  Phone: [phone]  Patient: Paula [name]  Gender: Female  DOB: 1951/12/17  Age: 62 Years  PCP: Birdie Sons (Adults only)  Office Follow Up:  Does the office need to follow up with this patient?: Yes  Instructions For The Office: would like rx for recurring area to left buttocks; last flareup 02/05/12  RN Note:  uses [facility]  Symptoms  Reason For Call & Symptoms: has been having a recurring rash to left buttocks that has occurred for many years; feels that it is related to a tanning bed; last outbreak 02/05/12 and seen in the office; says it is the size of a fifty cent piece; area is open and sticks to her underwear;  says topical ointments do not help  Reviewed Health History In EMR: Yes  Reviewed Medications In EMR: Yes  Reviewed Allergies In EMR: Yes  Reviewed Surgeries / Procedures: Yes  Date of Onset of Symptoms: 01/12/2013  Guideline(s) Used:  Rash or Redness - Localized  Disposition Per Guideline:   Home Care  Reason For Disposition Reached:   Mild localized rash  Advice Given:  Call Back If:  You become worse.

## 2013-01-14 NOTE — Telephone Encounter (Signed)
Patient states that she will call back to schedule.

## 2013-01-14 NOTE — Telephone Encounter (Signed)
Office visit with any provider if she desires.

## 2013-01-18 ENCOUNTER — Encounter: Payer: BC Managed Care – PPO | Admitting: Family Medicine

## 2013-01-18 NOTE — Progress Notes (Deleted)
No chief complaint on file.   HPI: Acute visit for Rash on buttocks: -ongoing for many years -pcp told her to see any provider ROS: See pertinent positives and negatives per HPI.  Past Medical History  Diagnosis Date  . CARPAL TUNNEL SYNDROME, MILD 12/05/2008  . HSV 03/01/2010  . OSTEOPENIA 12/05/2008  . PARESTHESIA 09/01/2009    Family History  Problem Relation Age of Onset  . Hypertension Mother   . Hypertension Brother   . Cancer Neg Hx     lung ca - mother's side  . Arthritis Neg Hx     osteo - mother's side  . Colon cancer Father 3  . Hypertension Son     History   Social History  . Marital Status: Single    Spouse Name: N/A    Number of Children: N/A  . Years of Education: N/A   Social History Main Topics  . Smoking status: Never Smoker   . Smokeless tobacco: Never Used  . Alcohol Use: 3.6 oz/week    6 Glasses of wine per week  . Drug Use: No  . Sexually Active: Not on file   Other Topics Concern  . Not on file   Social History Narrative  . No narrative on file    No current outpatient prescriptions on file.  EXAM:  There were no vitals filed for this visit.  There is no height or weight on file to calculate BMI.  GENERAL: vitals reviewed and listed above, alert, oriented, appears well hydrated and in no acute distress  HEENT: atraumatic, conjunttiva clear, no obvious abnormalities on inspection of external nose and ears  NECK: no obvious masses on inspection  LUNGS: clear to auscultation bilaterally, no wheezes, rales or rhonchi, good air movement  CV: HRRR, no peripheral edema  MS: moves all extremities without noticeable abnormality  PSYCH: pleasant and cooperative, no obvious depression or anxiety  ASSESSMENT AND PLAN:  Discussed the following assessment and plan:  No diagnosis found.  -Patient advised to return or notify a doctor immediately if symptoms worsen or persist or new concerns arise.  There are no Patient Instructions  on file for this visit.   Kriste Basque R.

## 2013-01-19 NOTE — Progress Notes (Signed)
This encounter was created in error - please disregard.

## 2013-01-22 ENCOUNTER — Ambulatory Visit: Payer: BC Managed Care – PPO | Admitting: Family Medicine

## 2013-01-22 ENCOUNTER — Encounter: Payer: BC Managed Care – PPO | Admitting: Family Medicine

## 2013-01-22 NOTE — Progress Notes (Signed)
NO SHOW. This encounter was created in error - please disregard.

## 2013-08-04 ENCOUNTER — Encounter: Payer: Self-pay | Admitting: Family Medicine

## 2013-08-04 ENCOUNTER — Ambulatory Visit (INDEPENDENT_AMBULATORY_CARE_PROVIDER_SITE_OTHER): Payer: Self-pay | Admitting: Family Medicine

## 2013-08-04 VITALS — BP 128/74 | HR 61 | Temp 97.8°F | Wt 151.0 lb

## 2013-08-04 DIAGNOSIS — M549 Dorsalgia, unspecified: Secondary | ICD-10-CM

## 2013-08-04 DIAGNOSIS — M546 Pain in thoracic spine: Secondary | ICD-10-CM

## 2013-08-04 MED ORDER — METAXALONE 800 MG PO TABS: 800.0000 mg | ORAL_TABLET | Freq: Three times a day (TID) | ORAL | Status: DC | PRN

## 2013-08-04 NOTE — Progress Notes (Signed)
  Subjective:    Patient ID: Paula [name], female    DOB: 09-07-1951, 62 y.o.   MRN: [id number]  HPI Left periscapular and upper back pain. Duration approximately one month. No injury. She does lots of typing and thinks this may be related to muscle tension. Denies any significant cervical neck pain. She does Pilates and also wonders if this might have been strained during those exercises. Taking ibuprofen without much improvement.  Patient had similar exacerbation about 5 months ago which eventually improved with combination of anti-inflammatory and muscle relaxer. Does not have any true shoulder pains. No headaches. No skin rash. No upper extremity weakness  Past Medical History  Diagnosis Date  . CARPAL TUNNEL SYNDROME, MILD 12/05/2008  . HSV 03/01/2010  . OSTEOPENIA 12/05/2008  . PARESTHESIA 09/01/2009   Past Surgical History  Procedure Laterality Date  . Orif tibia & fibula fractures    . Tonsillectomy and adenoidectomy      reports that she has never smoked. She has never used smokeless tobacco. She reports that she drinks about 3.6 ounces of alcohol per week. She reports that she does not use illicit drugs. family history includes Colon cancer (age of onset: 1) in her father and Hypertension in her brother, mother, and son.  There is no history of Cancer and Arthritis. Allergies  Allergen Reactions  . Ciprofloxacin Hcl     REACTION: nausea      Review of Systems  Constitutional: Negative for fever and chills.  Musculoskeletal: Positive for back pain.  Neurological: Negative for weakness, numbness and headaches.       Objective:   Physical Exam  Constitutional: She appears well-developed and well-nourished.  Cardiovascular: Normal rate and regular rhythm.   Pulmonary/Chest: Effort normal and breath sounds normal. No respiratory distress. She has no wheezes. She has no rales.  Musculoskeletal:  Full range of motion left shoulder. She has some muscular tenderness  medial to left scapular as well as minimal left trapezius and left side longissimus dorsi          Assessment & Plan:  Upper back pain. Left-sided muscular pain. We've recommended heat, muscle massage, continue anti-inflammatories, Skelaxin 800 mg 3 times a day when necessary. Set up physical therapy

## 2013-08-23 ENCOUNTER — Ambulatory Visit: Payer: BC Managed Care – PPO | Attending: Family Medicine

## 2013-08-23 DIAGNOSIS — IMO0001 Reserved for inherently not codable concepts without codable children: Secondary | ICD-10-CM | POA: Insufficient documentation

## 2013-08-23 DIAGNOSIS — M546 Pain in thoracic spine: Secondary | ICD-10-CM | POA: Insufficient documentation

## 2013-08-23 DIAGNOSIS — M542 Cervicalgia: Secondary | ICD-10-CM | POA: Insufficient documentation

## 2013-08-23 DIAGNOSIS — R5381 Other malaise: Secondary | ICD-10-CM | POA: Insufficient documentation

## 2013-09-07 ENCOUNTER — Ambulatory Visit: Payer: BC Managed Care – PPO | Attending: Family Medicine | Admitting: Physical Therapy

## 2013-09-07 DIAGNOSIS — IMO0001 Reserved for inherently not codable concepts without codable children: Secondary | ICD-10-CM | POA: Insufficient documentation

## 2013-09-07 DIAGNOSIS — R5381 Other malaise: Secondary | ICD-10-CM | POA: Insufficient documentation

## 2013-09-07 DIAGNOSIS — M546 Pain in thoracic spine: Secondary | ICD-10-CM | POA: Insufficient documentation

## 2013-09-07 DIAGNOSIS — M542 Cervicalgia: Secondary | ICD-10-CM | POA: Insufficient documentation

## 2013-09-08 ENCOUNTER — Encounter: Payer: BC Managed Care – PPO | Admitting: Physical Therapy

## 2013-09-10 ENCOUNTER — Ambulatory Visit: Payer: BC Managed Care – PPO | Admitting: Physical Therapy

## 2013-09-14 ENCOUNTER — Ambulatory Visit: Payer: BC Managed Care – PPO | Admitting: Physical Therapy

## 2013-09-16 ENCOUNTER — Ambulatory Visit: Payer: BC Managed Care – PPO | Admitting: Physical Therapy

## 2013-10-18 ENCOUNTER — Telehealth: Payer: Self-pay | Admitting: Internal Medicine

## 2013-10-18 NOTE — Telephone Encounter (Signed)
Patient called Nurse Triage line with concern for cough and congestion.  Attempted to call back at  [phone]. No answer/No contact. Left message to please call the office back for assistance.

## 2013-10-25 ENCOUNTER — Encounter: Payer: Self-pay | Admitting: Internal Medicine

## 2013-10-25 ENCOUNTER — Telehealth: Payer: Self-pay | Admitting: Internal Medicine

## 2013-10-25 ENCOUNTER — Ambulatory Visit (INDEPENDENT_AMBULATORY_CARE_PROVIDER_SITE_OTHER): Payer: BC Managed Care – PPO | Admitting: Internal Medicine

## 2013-10-25 VITALS — BP 130/80 | HR 76 | Temp 97.5°F | Resp 20 | Wt 154.0 lb

## 2013-10-25 DIAGNOSIS — Z23 Encounter for immunization: Secondary | ICD-10-CM

## 2013-10-25 DIAGNOSIS — M546 Pain in thoracic spine: Secondary | ICD-10-CM

## 2013-10-25 DIAGNOSIS — M549 Dorsalgia, unspecified: Secondary | ICD-10-CM

## 2013-10-25 NOTE — Telephone Encounter (Signed)
Pt would like to switch to dr kim. Can I sch? °

## 2013-10-25 NOTE — Patient Instructions (Signed)
You  may move around, but avoid painful motions and activities.  Apply heat to the sore area for 15 to 20 minutes 3 or 4 times daily for the next two to 3 days.  Physical therapy as discussed

## 2013-10-25 NOTE — Telephone Encounter (Signed)
Ok per Dr Swords 

## 2013-10-25 NOTE — Telephone Encounter (Signed)
Ok. Needs new patient visit.
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
## 2013-10-27 NOTE — Telephone Encounter (Signed)
lmom for pt to call back

## 2013-10-27 NOTE — Telephone Encounter (Signed)
Done

## 2013-10-29 ENCOUNTER — Telehealth: Payer: Self-pay | Admitting: Internal Medicine

## 2013-10-29 DIAGNOSIS — R05 Cough: Secondary | ICD-10-CM

## 2013-10-29 NOTE — Addendum Note (Signed)
Addended by: Kern Reap B on: 10/29/2013 04:49 PM   Modules accepted: Orders

## 2013-10-29 NOTE — Telephone Encounter (Signed)
Pt would like a referral to pulmonologist  DR Evaristo Bury [phone]. Pt saw DR K on 10-25-13. Pt has cough and unable to take deep breath with out coughing. Pt has an appt to see Dr Selena Batten on 11-23-13.

## 2013-10-29 NOTE — Telephone Encounter (Signed)
Referral order placed.

## 2013-10-29 NOTE — Telephone Encounter (Signed)
ok 

## 2013-11-22 ENCOUNTER — Ambulatory Visit: Payer: BC Managed Care – PPO | Attending: Family Medicine

## 2013-11-22 DIAGNOSIS — M542 Cervicalgia: Secondary | ICD-10-CM | POA: Insufficient documentation

## 2013-11-22 DIAGNOSIS — M546 Pain in thoracic spine: Secondary | ICD-10-CM | POA: Insufficient documentation

## 2013-11-22 DIAGNOSIS — IMO0001 Reserved for inherently not codable concepts without codable children: Secondary | ICD-10-CM | POA: Insufficient documentation

## 2013-11-22 DIAGNOSIS — R5381 Other malaise: Secondary | ICD-10-CM | POA: Insufficient documentation

## 2013-11-23 ENCOUNTER — Ambulatory Visit: Payer: BC Managed Care – PPO | Admitting: Family Medicine

## 2013-12-03 LAB — PULMONARY FUNCTION TEST

## 2013-12-09 ENCOUNTER — Ambulatory Visit: Payer: BC Managed Care – PPO | Admitting: Family Medicine

## 2013-12-09 ENCOUNTER — Ambulatory Visit: Payer: BC Managed Care – PPO | Attending: Family Medicine

## 2013-12-09 DIAGNOSIS — R5381 Other malaise: Secondary | ICD-10-CM | POA: Insufficient documentation

## 2013-12-09 DIAGNOSIS — IMO0001 Reserved for inherently not codable concepts without codable children: Secondary | ICD-10-CM | POA: Insufficient documentation

## 2013-12-09 DIAGNOSIS — M542 Cervicalgia: Secondary | ICD-10-CM | POA: Insufficient documentation

## 2013-12-09 DIAGNOSIS — M546 Pain in thoracic spine: Secondary | ICD-10-CM | POA: Insufficient documentation

## 2014-01-11 ENCOUNTER — Ambulatory Visit: Payer: BC Managed Care – PPO

## 2014-01-18 ENCOUNTER — Ambulatory Visit: Payer: BC Managed Care – PPO | Attending: Family Medicine

## 2014-01-18 DIAGNOSIS — IMO0001 Reserved for inherently not codable concepts without codable children: Secondary | ICD-10-CM | POA: Insufficient documentation

## 2014-01-18 DIAGNOSIS — M546 Pain in thoracic spine: Secondary | ICD-10-CM | POA: Insufficient documentation

## 2014-01-18 DIAGNOSIS — M542 Cervicalgia: Secondary | ICD-10-CM | POA: Insufficient documentation

## 2014-01-18 DIAGNOSIS — R5381 Other malaise: Secondary | ICD-10-CM | POA: Insufficient documentation

## 2014-01-24 ENCOUNTER — Ambulatory Visit: Payer: BC Managed Care – PPO | Admitting: Family Medicine

## 2014-02-01 ENCOUNTER — Ambulatory Visit: Payer: BC Managed Care – PPO

## 2014-02-02 ENCOUNTER — Ambulatory Visit: Payer: BC Managed Care – PPO | Attending: Family Medicine

## 2014-02-02 DIAGNOSIS — IMO0001 Reserved for inherently not codable concepts without codable children: Secondary | ICD-10-CM | POA: Insufficient documentation

## 2014-02-02 DIAGNOSIS — M546 Pain in thoracic spine: Secondary | ICD-10-CM | POA: Insufficient documentation

## 2014-02-02 DIAGNOSIS — M542 Cervicalgia: Secondary | ICD-10-CM | POA: Insufficient documentation

## 2014-02-02 DIAGNOSIS — R5381 Other malaise: Secondary | ICD-10-CM | POA: Insufficient documentation

## 2014-02-02 LAB — HM DEXA SCAN

## 2014-02-09 ENCOUNTER — Ambulatory Visit: Payer: BC Managed Care – PPO | Admitting: Physical Therapy

## 2014-02-15 ENCOUNTER — Ambulatory Visit: Payer: BC Managed Care – PPO | Admitting: Family Medicine

## 2014-02-17 ENCOUNTER — Ambulatory Visit: Payer: BC Managed Care – PPO

## 2014-02-28 ENCOUNTER — Ambulatory Visit: Payer: BC Managed Care – PPO | Admitting: Family Medicine

## 2014-02-28 ENCOUNTER — Encounter (HOSPITAL_BASED_OUTPATIENT_CLINIC_OR_DEPARTMENT_OTHER): Payer: Self-pay | Admitting: Emergency Medicine

## 2014-02-28 ENCOUNTER — Emergency Department (HOSPITAL_BASED_OUTPATIENT_CLINIC_OR_DEPARTMENT_OTHER)
Admission: EM | Admit: 2014-02-28 | Discharge: 2014-02-28 | Disposition: A | Discharge status: Home / Self Care | Payer: BC Managed Care – PPO | Attending: Emergency Medicine | Admitting: Emergency Medicine

## 2014-02-28 ENCOUNTER — Telehealth: Payer: Self-pay

## 2014-02-28 DIAGNOSIS — Y92410 Unspecified street and highway as the place of occurrence of the external cause: Secondary | ICD-10-CM | POA: Insufficient documentation

## 2014-02-28 DIAGNOSIS — S4980XA Other specified injuries of shoulder and upper arm, unspecified arm, initial encounter: Secondary | ICD-10-CM | POA: Insufficient documentation

## 2014-02-28 DIAGNOSIS — S161XXA Strain of muscle, fascia and tendon at neck level, initial encounter: Secondary | ICD-10-CM

## 2014-02-28 DIAGNOSIS — Z8619 Personal history of other infectious and parasitic diseases: Secondary | ICD-10-CM | POA: Insufficient documentation

## 2014-02-28 DIAGNOSIS — S139XXA Sprain of joints and ligaments of unspecified parts of neck, initial encounter: Secondary | ICD-10-CM | POA: Insufficient documentation

## 2014-02-28 DIAGNOSIS — S46909A Unspecified injury of unspecified muscle, fascia and tendon at shoulder and upper arm level, unspecified arm, initial encounter: Secondary | ICD-10-CM | POA: Insufficient documentation

## 2014-02-28 DIAGNOSIS — Z8739 Personal history of other diseases of the musculoskeletal system and connective tissue: Secondary | ICD-10-CM | POA: Insufficient documentation

## 2014-02-28 DIAGNOSIS — Z8669 Personal history of other diseases of the nervous system and sense organs: Secondary | ICD-10-CM | POA: Insufficient documentation

## 2014-02-28 DIAGNOSIS — R110 Nausea: Secondary | ICD-10-CM | POA: Insufficient documentation

## 2014-02-28 DIAGNOSIS — Y9389 Activity, other specified: Secondary | ICD-10-CM | POA: Insufficient documentation

## 2014-02-28 MED ORDER — CYCLOBENZAPRINE HCL 5 MG PO TABS: 5.0000 mg | ORAL_TABLET | Freq: Three times a day (TID) | ORAL | Status: DC | PRN

## 2014-02-28 MED ORDER — NAPROXEN 500 MG PO TABS: 500.0000 mg | ORAL_TABLET | Freq: Two times a day (BID) | ORAL | Status: DC
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
## 2014-02-28 NOTE — Telephone Encounter (Signed)
Per Dr. Julianne Rice request called and spoke with pt and pt is aware that she should go to ED.  Pt states she would rather go to [facility].
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
## 2014-02-28 NOTE — Discharge Instructions (Signed)
Take Naprosyn twice daily with food for pain  Continue with physical therapy Take flexeril as needed for muscle spasm - best used at night - Please be careful with this medication.  It can cause drowsiness.  Use caution while driving, operating machinery, drinking alcohol, or any other activities that may impair your physical or mental abilities.   Return to the emergency department if you develop any changing/worsening condition, severe headache, weakness, loss of sensation, repeated vomiting, vision changes, difficulty breathing/swallowing, or any other concerns (please read additional information regarding your condition below)    Cervical Sprain A cervical sprain is an injury in the neck in which the strong, fibrous tissues (ligaments) that connect your neck bones stretch or tear. Cervical sprains can range from mild to severe. Severe cervical sprains can cause the neck vertebrae to be unstable. This can lead to damage of the spinal cord and can result in serious nervous system problems. The amount of time it takes for a cervical sprain to get better depends on the cause and extent of the injury. Most cervical sprains heal in 1 to 3 weeks. CAUSES  Severe cervical sprains may be caused by:   Contact sport injuries (such as from football, rugby, wrestling, hockey, auto racing, gymnastics, diving, martial arts, or boxing).   Motor vehicle collisions.   Whiplash injuries. This is an injury from a sudden forward-and backward whipping movement of the head and neck.  Falls.  Mild cervical sprains may be caused by:   Being in an awkward position, such as while cradling a telephone between your ear and shoulder.   Sitting in a chair that does not offer proper support.   Working at a poorly Landscape [employer] station.   Looking up or down for long periods of time.  SYMPTOMS   Pain, soreness, stiffness, or a burning sensation in the front, back, or sides of the neck. This discomfort may  develop immediately after the injury or slowly, 24 hours or more after the injury.   Pain or tenderness directly in the middle of the back of the neck.   Shoulder or upper back pain.   Limited ability to move the neck.   Headache.   Dizziness.   Weakness, numbness, or tingling in the hands or arms.   Muscle spasms.   Difficulty swallowing or chewing.   Tenderness and swelling of the neck.  DIAGNOSIS  Most of the time your health care provider can diagnose a cervical sprain by taking your history and doing a physical exam. Your health care provider will ask about previous neck injuries and any known neck problems, such as arthritis in the neck. X-rays may be taken to find out if there are any other problems, such as with the bones of the neck. Other tests, such as a CT scan or MRI, may also be needed.  TREATMENT  Treatment depends on the severity of the cervical sprain. Mild sprains can be treated with rest, keeping the neck in place (immobilization), and pain medicines. Severe cervical sprains are immediately immobilized. Further treatment is done to help with pain, muscle spasms, and other symptoms and may include:  Medicines, such as pain relievers, numbing medicines, or muscle relaxants.   Physical therapy. This may involve stretching exercises, strengthening exercises, and posture training. Exercises and improved posture can help stabilize the neck, strengthen muscles, and help stop symptoms from returning.  HOME CARE INSTRUCTIONS   Put ice on the injured area.   Put ice in a plastic bag.  Place a towel between your skin and the bag.   Leave the ice on for 15 20 minutes, 3 4 times a day.   If your injury was severe, you may have been given a cervical collar to wear. A cervical collar is a two-piece collar designed to keep your neck from moving while it heals.  Do not remove the collar unless instructed by your health care provider.  If you have long hair,  keep it outside of the collar.  Ask your health care provider before making any adjustments to your collar. Minor adjustments may be required over time to improve comfort and reduce pressure on your chin or on the back of your head.  Ifyou are allowed to remove the collar for cleaning or bathing, follow your health care provider's instructions on how to do so safely.  Keep your collar clean by wiping it with mild soap and water and drying it completely. If the collar you have been given includes removable pads, remove them every 1 2 days and hand wash them with soap and water. Allow them to air dry. They should be completely dry before you wear them in the collar.  If you are allowed to remove the collar for cleaning and bathing, wash and dry the skin of your neck. Check your skin for irritation or sores. If you see any, tell your health care provider.  Do not drive while wearing the collar.   Only take over-the-counter or prescription medicines for pain, discomfort, or fever as directed by your health care provider.   Keep all follow-up appointments as directed by your health care provider.   Keep all physical therapy appointments as directed by your health care provider.   Make any needed adjustments to your workstation to promote good posture.   Avoid positions and activities that make your symptoms worse.   Warm up and stretch before being active to help prevent problems.  SEEK MEDICAL CARE IF:   Your pain is not controlled with medicine.   You are unable to decrease your pain medicine over time as planned.   Your activity level is not improving as expected.  SEEK IMMEDIATE MEDICAL CARE IF:   You develop any bleeding.  You develop stomach upset.  You have signs of an allergic reaction to your medicine.   Your symptoms get worse.   You develop new, unexplained symptoms.   You have numbness, tingling, weakness, or paralysis in any part of your body.  MAKE  SURE YOU:   Understand these instructions.  Will watch your condition.  Will get help right away if you are not doing well or get worse. Document Released: 10/13/2007 Document Revised: 10/06/2013 Document Reviewed: 06/23/2013 [facility] Patient Information 2014 [place].
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
## 2014-03-03 NOTE — ED Provider Notes (Signed)
Medical screening examination/treatment/procedure(s) were performed by non-physician practitioner and as supervising physician I was immediately available for consultation/collaboration.   EKG Interpretation None        Sakara Lehtinen, MD 03/03/14 1900 

## 2014-03-14 ENCOUNTER — Ambulatory Visit (INDEPENDENT_AMBULATORY_CARE_PROVIDER_SITE_OTHER): Payer: BC Managed Care – PPO | Admitting: Family Medicine

## 2014-03-14 ENCOUNTER — Encounter: Payer: Self-pay | Admitting: Family Medicine

## 2014-03-14 VITALS — BP 110/76 | Temp 97.5°F | Ht 65.0 in | Wt 155.0 lb

## 2014-03-14 DIAGNOSIS — E041 Nontoxic single thyroid nodule: Secondary | ICD-10-CM

## 2014-03-14 DIAGNOSIS — C8590 Non-Hodgkin lymphoma, unspecified, unspecified site: Secondary | ICD-10-CM

## 2014-03-14 DIAGNOSIS — R05 Cough: Secondary | ICD-10-CM | POA: Insufficient documentation

## 2014-03-14 DIAGNOSIS — M549 Dorsalgia, unspecified: Secondary | ICD-10-CM

## 2014-03-14 DIAGNOSIS — C8589 Other specified types of non-Hodgkin lymphoma, extranodal and solid organ sites: Secondary | ICD-10-CM

## 2014-03-14 DIAGNOSIS — R053 Chronic cough: Secondary | ICD-10-CM

## 2014-03-14 DIAGNOSIS — Z23 Encounter for immunization: Secondary | ICD-10-CM

## 2014-03-14 DIAGNOSIS — R059 Cough, unspecified: Secondary | ICD-10-CM

## 2014-03-14 HISTORY — DX: Chronic cough: R05.3

## 2014-03-14 LAB — TSH: TSH: 0.51 u[IU]/mL (ref 0.35–5.50)

## 2014-03-14 LAB — T4, FREE: Free T4: 0.84 ng/dL (ref 0.60–1.60)

## 2014-03-14 NOTE — Patient Instructions (Addendum)
-  We placed a referral for you as discussed for the ultrasound of your neck. It usually takes about 1-2 weeks to process and schedule this referral. If you have not heard from [place] regarding this appointment in 2 weeks please contact our office.  -We have ordered labs or studies at this visit. It can take up to 1-2 weeks for results and processing. We will contact you with instructions IF your results are abnormal. Normal results will be released to your [facility]. If you have not heard from [place] or can not find your results in [facility] in 2 weeks please contact our office.  -see your pulmonologist regarding your cough  -follow up as needed and yearly  -check on the cost of the shingles vaccine  -if your shoulder problem persists please call us
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
## 2014-03-15 ENCOUNTER — Encounter: Payer: Self-pay | Admitting: Family Medicine

## 2014-03-15 DIAGNOSIS — C8800 Waldenstrom macroglobulinemia not having achieved remission: Secondary | ICD-10-CM

## 2014-03-15 HISTORY — DX: Waldenstrom macroglobulinemia: C88.0

## 2014-03-15 HISTORY — DX: Waldenstrom macroglobulinemia not having achieved remission: C88.00

## 2014-03-22 ENCOUNTER — Other Ambulatory Visit: Payer: BC Managed Care – PPO

## 2014-04-01 ENCOUNTER — Ambulatory Visit
Admission: RE | Admit: 2014-04-01 | Discharge: 2014-04-01 | Disposition: A | Discharge status: Home / Self Care | Payer: BC Managed Care – PPO | Source: Ambulatory Visit | Attending: Family Medicine | Admitting: Family Medicine

## 2014-04-01 DIAGNOSIS — E041 Nontoxic single thyroid nodule: Secondary | ICD-10-CM

## 2014-04-01 IMAGING — US US SOFT TISSUE HEAD/NECK
1 series · 13 of 25 positions shown · non-contrast
Comparison: None.

CLINICAL DATA: Thyroid nodule.

EXAM:
THYROID ULTRASOUND
TECHNIQUE: Ultrasound examination of the thyroid gland and adjacent soft
tissues was performed.

[Series 1: us soft tissue head/neck · 0.05mm/px · 13 of 56 slices shown]
[im 1/56]
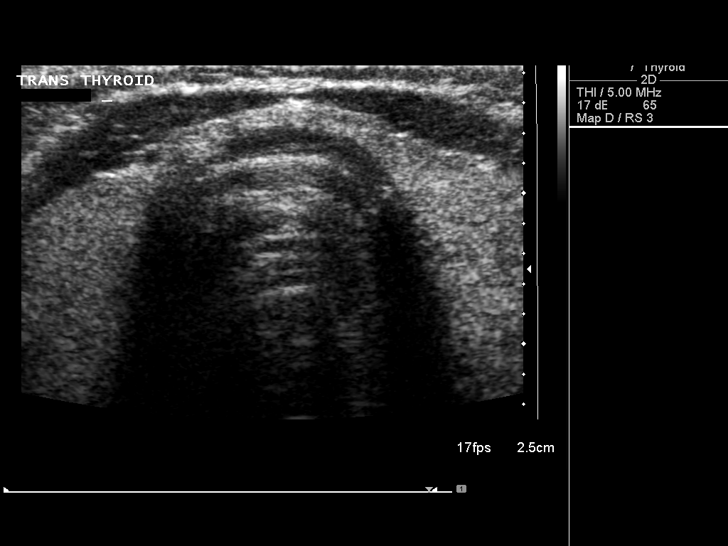
[im 5/56]
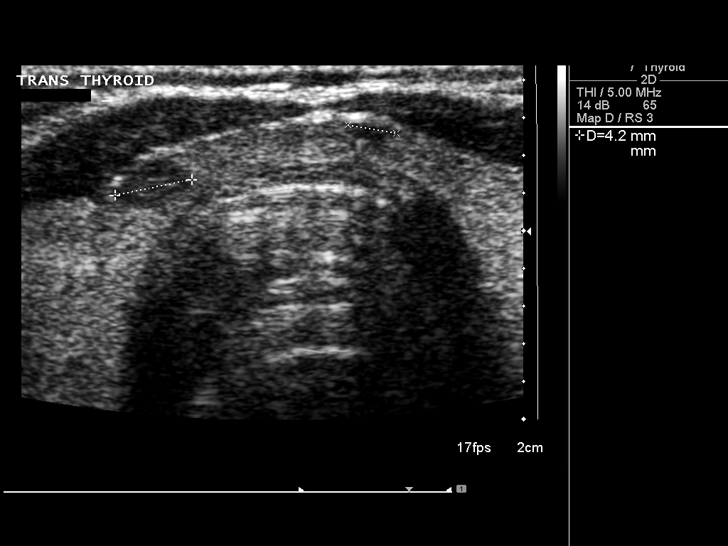
[im 10/56]
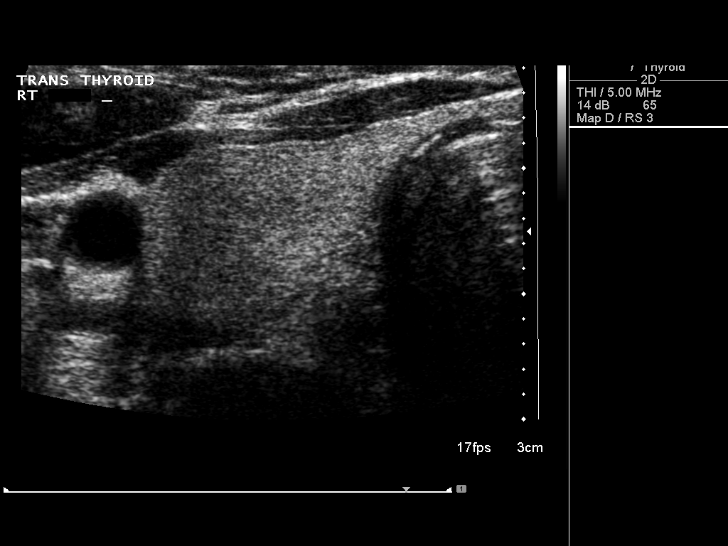
[im 14/56]
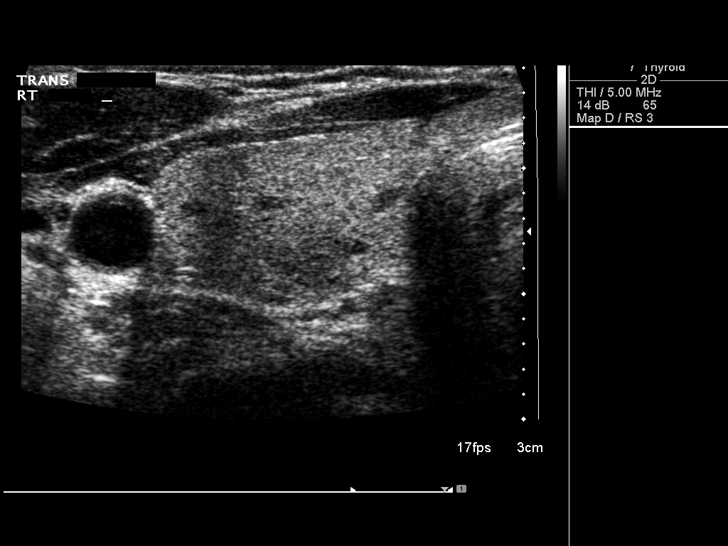
[im 19/56]
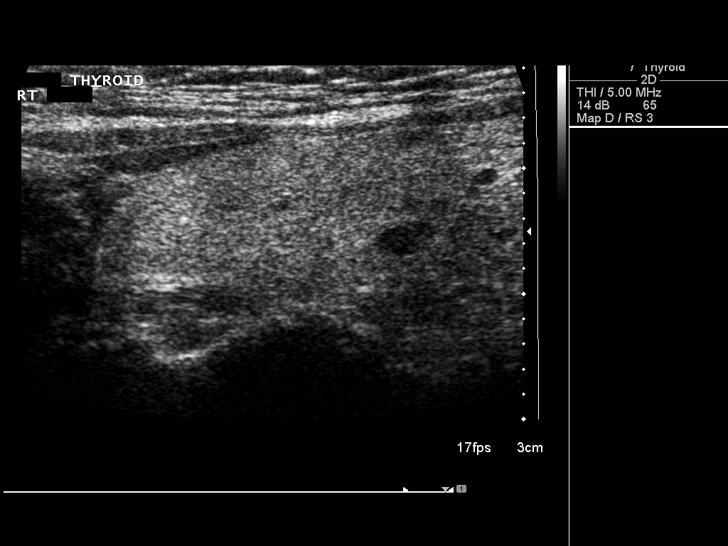
[im 23/56]
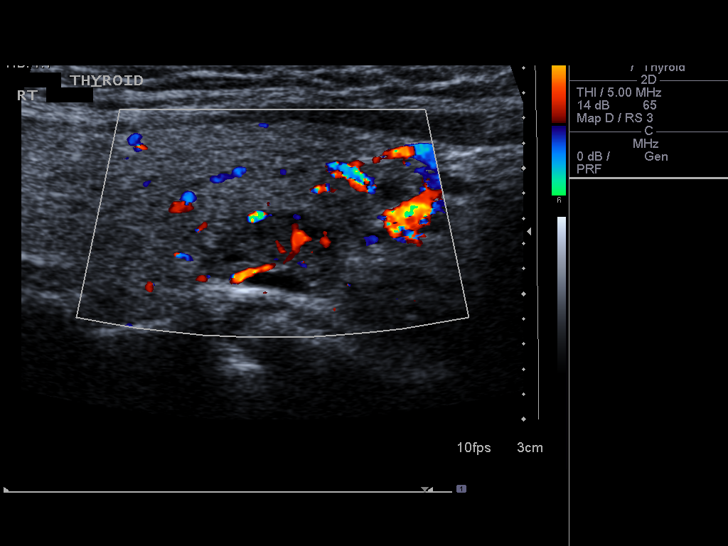
[im 28/56]
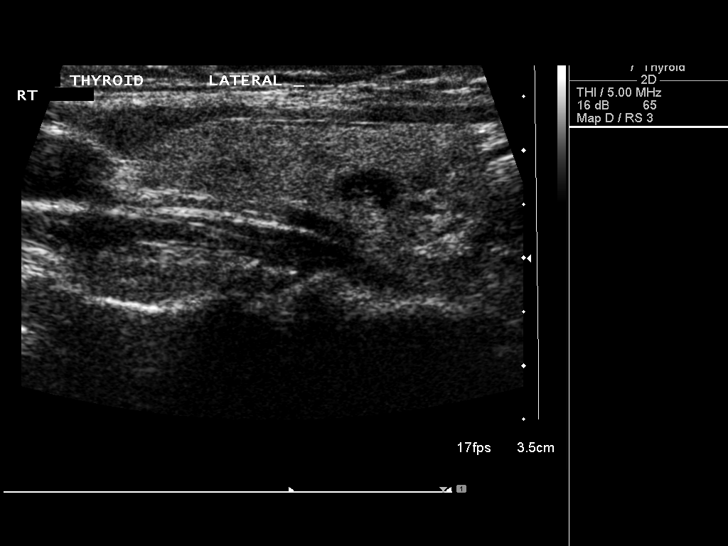
[im 33/56]
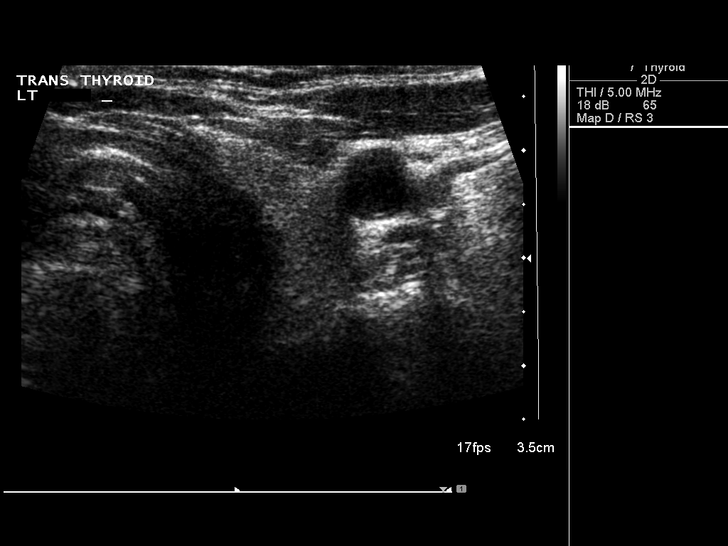
[im 37/56]
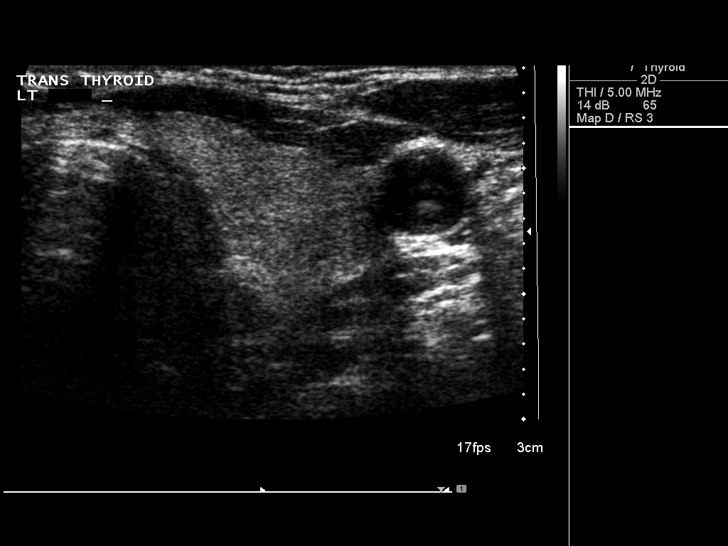
[im 42/56]
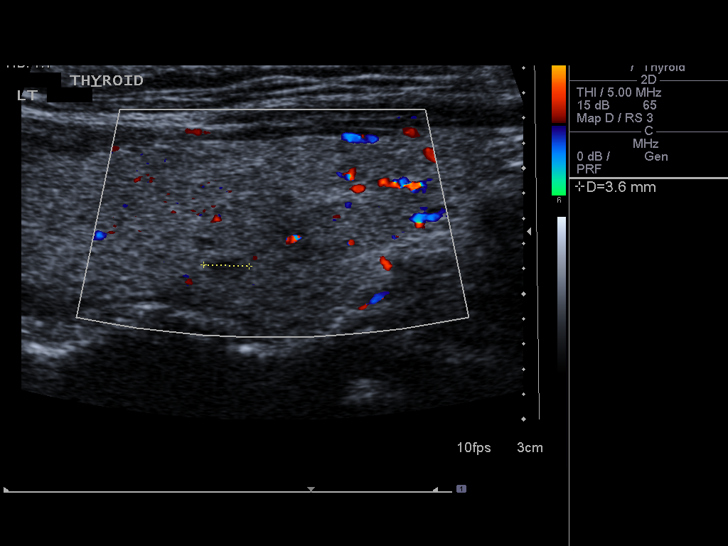
[im 46/56]
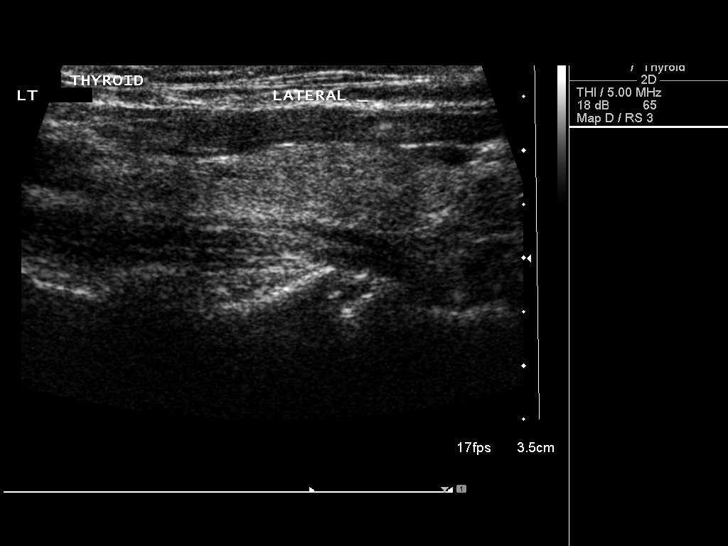
[im 51/56]
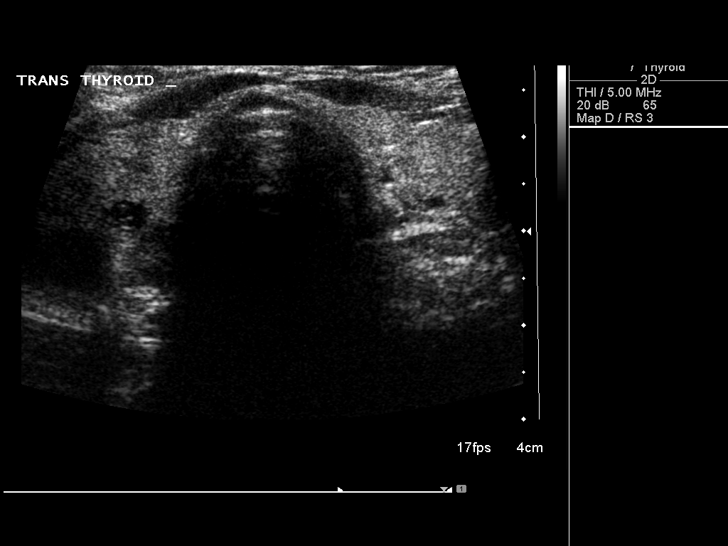
[im 56/56]
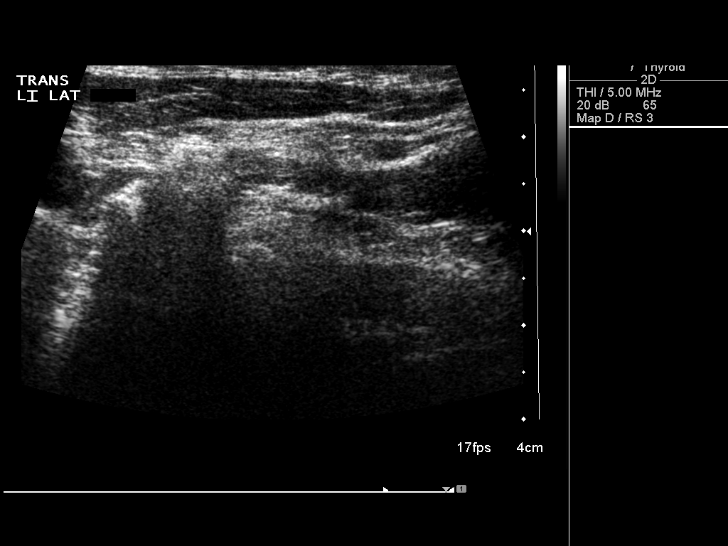

[13 of 25 positions shown; findings below may reference images not displayed]

FINDINGS: Right thyroid lobe

Measurements: 4.9 x 1.5 x 1.8 cm. Multiple small nodules were seen
throughout the right thyroid lobe, the majority of which were
subcentimeter and partially or completely cystic. A solid nodule in
the lower pole measured 12 x 7 x 9 mm with some internal
vascularity. No microcalcifications were identified in this nodule.

Left thyroid lobe

Measurements: 4.6 x 1.6 x 1.5 cm.  A single 4 mm nodule was present.

Isthmus

Thickness: 2.5 mm.  Two small nodules measured 4 and 3 mm.

Lymphadenopathy

None visualized.
IMPRESSION: Small bilateral thyroid nodules, the largest measuring 12 mm in the
right lobe. Findings do not meet current SRU consensus criteria for
biopsy. Follow-up by clinical exam is recommended. If patient has
known risk factors for thyroid carcinoma, consider follow-up
ultrasound in 12 months. If patient is clinically hyperthyroid,
consider nuclear medicine thyroid uptake and scan.Reference:
Management of Thyroid Nodules Detected at US: Society of
Radiologists in Ultrasound Consensus Conference Statement. Radiology

## 2014-04-08 ENCOUNTER — Other Ambulatory Visit: Payer: Self-pay

## 2014-04-15 ENCOUNTER — Encounter: Payer: BC Managed Care – PPO | Admitting: Family Medicine

## 2014-04-15 ENCOUNTER — Encounter: Payer: Self-pay | Admitting: Family Medicine

## 2014-04-15 ENCOUNTER — Telehealth: Payer: Self-pay | Admitting: Internal Medicine

## 2014-04-15 ENCOUNTER — Other Ambulatory Visit: Payer: BC Managed Care – PPO

## 2014-04-15 NOTE — Telephone Encounter (Signed)
We will waive this fee once for this time. But make sure she knows: A 30 minute appointment time was reserved for her and thus other patients could not be scheduled that may have needed an appointment. We charge for all late reschedules and no shows. Patients are asked to arrive 15 minutes prior to the appointment with the doctor for their check in and nurse evaluation. If you arrive late you will likely be asked to reschedule and a late fee will be charged. Please let Curt Bears know to waive the late fee. Thanks.

## 2014-04-15 NOTE — Telephone Encounter (Signed)
Left a message for return call.  

## 2014-04-15 NOTE — Progress Notes (Signed)
Error   This encounter was created in error - please disregard. 

## 2014-04-15 NOTE — Telephone Encounter (Signed)
Patient was late for today's appt and was told to reschedule per PCP. I rescheduled her appt, but was unsure of how many 30 minute appts Dr. Maudie Mercury would do in one day. I scheduled the appt for next Wednesday, 04/20/14 at 10:45 a.m. The patient is now concerned with today's appt being set as a NO SHOW and/or having a $50 charge attached to it. Derinda Late stated that it may not be, but to send a note back to you in reference to patient's concern. The patient's contact number has been verified and she awaits a call about a late fee if any. Thanks!!!

## 2014-04-18 NOTE — Telephone Encounter (Signed)
Left a message for return call.  

## 2014-04-19 NOTE — Telephone Encounter (Signed)
Called and spoke with pt and pt is aware. Pt states she had multiple appts and got confused.

## 2014-04-20 ENCOUNTER — Ambulatory Visit (INDEPENDENT_AMBULATORY_CARE_PROVIDER_SITE_OTHER): Payer: BC Managed Care – PPO | Admitting: Family Medicine

## 2014-04-20 ENCOUNTER — Encounter: Payer: Self-pay | Admitting: Family Medicine

## 2014-04-20 VITALS — BP 140/80 | HR 61 | Temp 98.0°F | Ht 65.0 in | Wt 159.0 lb

## 2014-04-20 DIAGNOSIS — Z2809 Immunization not carried out because of other contraindication: Secondary | ICD-10-CM | POA: Insufficient documentation

## 2014-04-20 DIAGNOSIS — Z2889 Immunization not carried out for other reason: Secondary | ICD-10-CM

## 2014-04-20 DIAGNOSIS — E042 Nontoxic multinodular goiter: Secondary | ICD-10-CM

## 2014-04-20 DIAGNOSIS — Z0000 Encounter for general adult medical examination without abnormal findings: Secondary | ICD-10-CM

## 2014-04-20 DIAGNOSIS — E559 Vitamin D deficiency, unspecified: Secondary | ICD-10-CM

## 2014-04-20 LAB — LIPID PANEL
CHOL/HDL RATIO: 3
Cholesterol: 177 mg/dL (ref 0–200)
HDL: 52.5 mg/dL (ref >39.00)
LDL CALC: 115 mg/dL — AB (ref 0–99)
Triglycerides: 50 mg/dL (ref 0.0–149.0)
VLDL: 10 mg/dL (ref 0.0–40.0)

## 2014-04-20 LAB — HEMOGLOBIN A1C: Hgb A1c MFr Bld: 5.3 % (ref 4.6–6.5)

## 2014-04-20 NOTE — Progress Notes (Signed)
No chief complaint on file.   HPI:  Here for CPE:  -Concerns today/Follow up:  Thyroid nodules: -reviewed labs/US/recs -opted for repeating US in 12 months as she is worried about this  Shoulder strain: -reports: doing better  Cerumen Impaction: -reports: doing better  Osteopenia: -followed by gyn  Chronic Cough: -followed by [facility] -she saw them recently and the advised inhaler -now cough resolved  Waldenstroms Macroglobulinemia: -followed by Dr. Carolynn Sayers  Hx of AK: -used to see dermatologist every year or so for skin exam -no hx of skin cancer or fh of melanoma -had bx and thinks precancer on L shin  -Diet: variety of foods, balance and well rounded, larger portion sizes  -Taking Vitamin D and Calcium: no  -Exercise: no ular exercise  -Diabetes and Dyslipidemia Screening: wants to check today  -Hx of HTN: no  -Vaccines: UTD - can't have live vaccines  -pap history: Sees Dr. Matthew Saras for female exams and UTD on this  -sexual activity: yes, female partner, no new partners  -wants STI testing: no  -FH breast, colon or ovarian ca: see FH  -Alcohol, Tobacco, drug use: see social history  Review of Systems -  Past Medical History  Diagnosis Date  . CARPAL TUNNEL SYNDROME, MILD 12/05/2008  . HSV 03/01/2010  . OSTEOPENIA 12/05/2008  . PARESTHESIA 09/01/2009  . Chronic cough - seeing pulmonologist in [place], [facility] 03/14/2014  . Waldenstroms macroglobulinemia - followed by Dr. Carolynn Sayers, [place], [place] 03/15/2014    Past Surgical History  Procedure Laterality Date  . Orif tibia & fibula fractures    . Tonsillectomy and adenoidectomy      Family History  Problem Relation Age of Onset  . Hypertension Mother   . Hypertension Brother   . Cancer Neg Hx     lung ca - mother's side  . Arthritis Neg Hx     osteo - mother's side  . Colon cancer Father 16  . Hypertension Son     History   Social History  .  Marital Status: Married    Spouse Name: N/A    Number of Children: N/A  . Years of Education: N/A   Social History Main Topics  . Smoking status: Never Smoker   . Smokeless tobacco: Never Used  . Alcohol Use: 3.6 oz/week    6 Glasses of wine per week  . Drug Use: No  . Sexual Activity: None   Other Topics Concern  . 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  EXAM:  Filed Vitals:   04/20/14 1048  BP: 140/80  Pulse: 61  Temp: 98 F (36.7 C)    GENERAL: vitals reviewed and listed below, alert, oriented, appears well hydrated and in no acute distress  HEENT: head atraumatic, PERRLA, normal appearance of eyes, ears, nose and mouth. moist mucus membranes.  NECK: supple, no masses or lymphadenopathy  LUNGS: clear to auscultation bilaterally, no rales, rhonchi or wheeze  CV: HRRR, no peripheral edema or cyanosis, normal pedal pulses  BREAST: declined  ABDOMEN: bowel sounds normal, soft, non tender to palpation, no masses, no rebound or guarding  GU: declined  RECTAL: deferred  SKIN: small 1.5 cm patchy erythematous region L shin  MS: normal  gait, moves all extremities normally  NEURO: gait normal  PSYCH: normal affect, pleasant and cooperative  ASSESSMENT AND PLAN:  Discussed the following assessment and plan:  Visit for preventive health examination - Plan: Lipid Panel, Hemoglobin A1c  Vaccine contraindicated - live vaccines contraindicated  Vitamin D deficiency - Plan: Vitamin D, 25-hydroxy  Multiple thyroid nodules - Plan: US Soft Tissue Head/Neck  -Discussed and advised all [place] preventive services health task force level A and B recommendations for age, sex and risks.  -Advised at least 150 minutes of exercise per week and a healthy diet low in saturated fats and sweets and consisting of fresh fruits and  vegetables, lean meats such as fish and white chicken and whole grains.  -advised to see her dermatologist promptly regarding the lesion on her leg  -[place] in 1 year for nodules  -she is to discuss shingles vaccine with her oncologist  -NON-FASTING labs, studies and vaccines per orders this encounter  Orders Placed This Encounter  Procedures  . US Soft Tissue Head/Neck    Standing Status: Future     Number of Occurrences:      Standing Expiration Date: 06/21/2015    Order Specific Question:  Reason for Exam (SYMPTOM  OR DIAGNOSIS REQUIRED)    Answer:  thyroid nodules    Order Specific Question:  Preferred imaging location?    Answer:  [facility]  . Lipid Panel  . Hemoglobin A1c  . Vitamin D, 25-hydroxy    Patient Instructions  -1000 IU D3 daily ([employer] brand); Calcium 1200mg  total from diet and supplement about half of that likely come from your diet  -ultrasound of thyroid in one year  -see dermatologist  -We recommend the following healthy lifestyle measures: - eat a healthy diet consisting of lots of vegetables, fruits, beans, nuts, seeds, healthy meats such as white chicken and fish and whole grains.  - avoid fried foods, fast food, processed foods, sodas, red meet and other fattening foods.  - get a least 150 minutes of aerobic exercise per week.   -We have ordered labs or studies at this visit. It can take up to 1-2 weeks for results and processing. We will contact you with instructions IF your results are abnormal. Normal results will be released to your [facility]. If you have not heard from [place] or can not find your results in [facility] in 2 weeks please contact our office.  Follow up yearly and as needed    Patient advised to return to clinic immediately if symptoms worsen or persist or new concerns.   Return in about 1 year (around 04/21/2015), or if symptoms worsen or fail to improve.  Paula [name]

## 2014-04-20 NOTE — Progress Notes (Signed)
Pre visit review using our clinic review tool, if applicable. No additional management support is needed unless otherwise documented below in the visit note. 

## 2014-04-20 NOTE — Patient Instructions (Addendum)
-  1000 IU D3 daily ([employer] brand); Calcium 1200mg  total from diet and supplement about half of that likely come from your diet  -ultrasound of thyroid in one year  -see dermatologist  -We recommend the following healthy lifestyle measures: - eat a healthy diet consisting of lots of vegetables, fruits, beans, nuts, seeds, healthy meats such as white chicken and fish and whole grains.  - avoid fried foods, fast food, processed foods, sodas, red meet and other fattening foods.  - get a least 150 minutes of aerobic exercise per week.   -We have ordered labs or studies at this visit. It can take up to 1-2 weeks for results and processing. We will contact you with instructions IF your results are abnormal. Normal results will be released to your [facility]. If you have not heard from [place] or can not find your results in [facility] in 2 weeks please contact our office.  Follow up yearly and as needed

## 2014-04-21 ENCOUNTER — Telehealth: Payer: Self-pay | Admitting: Family Medicine

## 2014-04-21 LAB — VITAMIN D 25 HYDROXY (VIT D DEFICIENCY, FRACTURES): VIT D 25 HYDROXY: 27 ng/mL — AB (ref 30–89)

## 2014-04-21 NOTE — Telephone Encounter (Signed)
Pt said someone called her about lab results but did not leave a name she was returning that call. Would like to be called back after 1pm

## 2014-04-21 NOTE — Telephone Encounter (Signed)
Pt.notified

## 2014-06-02 ENCOUNTER — Other Ambulatory Visit: Payer: Self-pay | Admitting: Dermatology

## 2014-07-12 ENCOUNTER — Ambulatory Visit (INDEPENDENT_AMBULATORY_CARE_PROVIDER_SITE_OTHER): Payer: BC Managed Care – PPO | Admitting: Family Medicine

## 2014-07-12 ENCOUNTER — Encounter: Payer: Self-pay | Admitting: Family Medicine

## 2014-07-12 VITALS — BP 112/80 | HR 66 | Temp 97.9°F | Ht 65.0 in | Wt 158.5 lb

## 2014-07-12 DIAGNOSIS — H6122 Impacted cerumen, left ear: Secondary | ICD-10-CM

## 2014-07-12 DIAGNOSIS — H6120 Impacted cerumen, unspecified ear: Secondary | ICD-10-CM

## 2014-07-12 NOTE — Progress Notes (Signed)
No chief complaint on file.   HPI:  Acute visit for:  1)Clogged ears: -reports: ear wax issues, tried debrox a few weeks ago, pressure in L ear -was going to get audiology exam at [facility] but has to have wax removed first -denies: fevers, nasal congestion, pain  ROS: See pertinent positives and negatives per HPI.  Past Medical History  Diagnosis Date  . CARPAL TUNNEL SYNDROME, MILD 12/05/2008  . HSV 03/01/2010  . OSTEOPENIA 12/05/2008  . PARESTHESIA 09/01/2009  . Chronic cough - seeing pulmonologist in [place], [facility] 03/14/2014  . Waldenstroms macroglobulinemia - followed by Dr. Carolynn Sayers, [place], [place] 03/15/2014    Past Surgical History  Procedure Laterality Date  . Orif tibia & fibula fractures    . Tonsillectomy and adenoidectomy      Family History  Problem Relation Age of Onset  . Hypertension Mother   . Hypertension Brother   . Cancer Neg Hx     lung ca - mother's side  . Arthritis Neg Hx     osteo - mother's side  . Colon cancer Father 21  . Hypertension Son     History   Social History  . Marital Status: Married    Spouse Name: N/A    Number of Children: N/A  . Years of Education: N/A   Social History Main Topics  . Smoking status: Never Smoker   . Smokeless tobacco: Never Used  . Alcohol Use: 3.6 oz/week    6 Glasses of wine per week  . Drug Use: No  . Sexual Activity: None   Other Topics Concern  . 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  EXAMDanley Danker Vitals:   07/12/14 0924  BP: 112/80  Pulse: 66  Temp: 97.9 F (36.6 C)    Body mass index is 26.38 kg/(m^2).  GENERAL: vitals reviewed and listed above, alert, oriented, appears well hydrated and in no acute distress  HEENT: atraumatic, conjunttiva clear, normal appearance of ear canals except for soft wax  in L ear, no obvious abnormalities on inspection of external nose and ears  NECK: no obvious masses on inspection  MS: moves all extremities without noticeable abnormality  PSYCH: pleasant and cooperative, no obvious depression or anxiety  ASSESSMENT AND PLAN:  Discussed the following assessment and plan:  Cerumen impaction, left  -discussed options and risks and she opted for L ear lavage by my assistant, tolerated well - and felt much better after -on recheck ear canal and TM normal -Patient advised to return or notify a doctor immediately if symptoms worsen or persist or new concerns arise.  There are no Patient Instructions on file for this visit.   Colin Benton R.

## 2014-07-12 NOTE — Progress Notes (Signed)
Pre visit review using our clinic review tool, if applicable. No additional management support is needed unless otherwise documented below in the visit note. 

## 2014-09-12 ENCOUNTER — Encounter: Payer: Self-pay | Admitting: Gastroenterology

## 2014-09-14 ENCOUNTER — Other Ambulatory Visit: Payer: Self-pay | Admitting: Obstetrics and Gynecology

## 2014-09-15 LAB — CYTOLOGY - PAP

## 2015-01-05 ENCOUNTER — Ambulatory Visit: Payer: Self-pay | Admitting: Family Medicine

## 2015-01-30 DIAGNOSIS — L989 Disorder of the skin and subcutaneous tissue, unspecified: Secondary | ICD-10-CM

## 2015-01-30 HISTORY — DX: Disorder of the skin and subcutaneous tissue, unspecified: L98.9

## 2015-01-30 HISTORY — PX: LEG SKIN LESION  BIOPSY / EXCISION: SUR473

## 2015-04-24 ENCOUNTER — Encounter: Payer: Self-pay | Admitting: Family Medicine

## 2015-05-19 ENCOUNTER — Telehealth: Payer: Self-pay | Admitting: *Deleted

## 2015-05-19 NOTE — Telephone Encounter (Signed)
Pt had a mammogram about 8 months ago at [facility].

## 2015-05-19 NOTE — Telephone Encounter (Signed)
Left message for pt to call back.  Need to know if pt had mammogram, when, and where

## 2015-08-10 ENCOUNTER — Ambulatory Visit (INDEPENDENT_AMBULATORY_CARE_PROVIDER_SITE_OTHER): Payer: BLUE CROSS/BLUE SHIELD | Admitting: Family Medicine

## 2015-08-10 ENCOUNTER — Encounter: Payer: Self-pay | Admitting: Family Medicine

## 2015-08-10 VITALS — BP 118/82 | HR 82 | Temp 97.7°F | Ht 64.75 in | Wt 160.4 lb

## 2015-08-10 DIAGNOSIS — E041 Nontoxic single thyroid nodule: Secondary | ICD-10-CM | POA: Diagnosis not present

## 2015-08-10 DIAGNOSIS — Z0000 Encounter for general adult medical examination without abnormal findings: Secondary | ICD-10-CM | POA: Diagnosis not present

## 2015-08-10 DIAGNOSIS — Z23 Encounter for immunization: Secondary | ICD-10-CM

## 2015-08-10 DIAGNOSIS — Z1159 Encounter for screening for other viral diseases: Secondary | ICD-10-CM

## 2015-08-10 NOTE — Addendum Note (Signed)
Addended by: Agnes Lawrence on: 08/10/2015 09:41 AM   Modules accepted: Orders

## 2015-08-10 NOTE — Progress Notes (Signed)
HPI:  Here for CPE:  -Concerns and/or follow up today:  Thyroid nodule: -she has not had any issues or noticed growing -she wants to repeat US, last US done in 03/2014  -Diet: variety of foods - but eats too many carbs  -Exercise: regular exercise - pilates 3-4 days per week  -Taking folic acid, vitamin D or calcium: no  -Diabetes and Dyslipidemia Screening: already done and normal at work last week per her report, ok last year  She did a bunch of labs with oncology last week  -Hx of HTN: no  -Vaccines: Prevnar 13 today  -pap history: sees Dr. Matthew Saras and does pap smears there  -FDLMP: n/a  -sexual activity: yes, female partner, no new partners  -wants STI testing (Hep C if born [birth year]): yes for hep C  -FH breast, colon or ovarian ca: see FH Last mammogram: does yearly, breast health is done with dr. Matthew Saras Last colon cancer screening:  done  Breast Ca Risk Assessment: -[employer]Apps[employer]it  DEXA (>/= 72): does with Dr. Toma Deiters   -Alcohol, Tobacco, drug use: see social history  Review of Systems - no fevers, unintentional weight loss, vision loss, hearing loss, chest pain, sob, hemoptysis, melena, hematochezia, hematuria, genital discharge, changing or concerning skin lesions, bleeding, bruising, loc, thoughts of self harm or SI  Past Medical History  Diagnosis Date  . CARPAL TUNNEL SYNDROME, MILD 12/05/2008  . HSV 03/01/2010  . OSTEOPENIA 12/05/2008  . PARESTHESIA 09/01/2009  . Chronic cough - seeing pulmonologist in [place], [facility] 03/14/2014  . Waldenstroms macroglobulinemia - followed by Dr. Carolynn Sayers, [place], [place] 03/15/2014  . Leg skin lesion, left 01/30/2015    precancerous cells per patient    Past Surgical History  Procedure Laterality Date  . Orif tibia & fibula fractures    . Tonsillectomy and adenoidectomy    . Leg skin lesion  biopsy / excision Left 01/30/2015    Dr Santiago Glad Gould-precancerous cells per  patient    Family History  Problem Relation Age of Onset  . Hypertension Mother   . Hypertension Brother   . Cancer Neg Hx     lung ca - mother's side  . Arthritis Neg Hx     osteo - mother's side  . Colon cancer Father 46  . Hypertension Son     Social History   Social History  . Marital Status: Married    Spouse Name: N/A  . Number of Children: N/A  . Years of Education: N/A   Social History Main Topics  . Smoking status: Never Smoker   . Smokeless tobacco: Never Used  . Alcohol Use: 3.6 oz/week    6 Glasses of wine per week  . Drug Use: No  . Sexual Activity: Not Asked   Other Topics Concern  . 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  EXAM:  Filed Vitals:   08/10/15 0817  BP: 118/82  Pulse: 82  Temp: 97.7 F (36.5 C)    GENERAL: vitals reviewed and listed below, alert, oriented, appears well hydrated and in no acute distress  HEENT: head atraumatic, PERRLA, normal appearance of eyes, ears, nose and mouth. moist mucus membranes.  NECK: supple, no masses or lymphadenopathy  LUNGS: clear to  auscultation bilaterally, no rales, rhonchi or wheeze  CV: HRRR, no peripheral edema or cyanosis, normal pedal pulses  BREAST: does with gyn  ABDOMEN: bowel sounds normal, soft, non tender to palpation, no masses, no rebound or guarding  QI:ONGE with gyn  RECTAL: refused  SKIN: no rash or abnormal lesions  MS: normal gait, moves all extremities normally  NEURO: CN II-XII grossly intact, normal muscle strength and sensation to light touch on extremities  PSYCH: normal affect, pleasant and cooperative  ASSESSMENT AND PLAN:  Discussed the following assessment and plan:  Visit for preventive health examination  Thyroid nodule - Plan: US Soft Tissue Head/Neck  Need for hepatitis C screening test - Plan:  Hep C Antibody   -Discussed and advised all [place] preventive services health task force level A and B recommendations for age, sex and risks.  -Advised at least 150 minutes of exercise per week and a healthy diet low in saturated fats and sweets and consisting of fresh fruits and vegetables, lean meats such as fish and white chicken and whole grains.  -labs, studies and vaccines per orders this encounter  Orders Placed This Encounter  Procedures  . US Soft Tissue Head/Neck    Standing Status: Future     Number of Occurrences:      Standing Expiration Date: 10/09/2016    Order Specific Question:  Reason for Exam (SYMPTOM  OR DIAGNOSIS REQUIRED)    Answer:  thyroid nodule follow up    Order Specific Question:  Preferred imaging location?    Answer:  [facility]  . Hep C Antibody    Patient advised to return to clinic immediately if symptoms worsen or persist or new concerns.  Patient Instructions  BEFORE YOU LEAVE: -prevnar 13 -lab  -We placed a referral for you as discussed for the ultrasound. It usually takes about 1-2 weeks to process and schedule this referral. If you have not heard from [place] regarding this appointment in 2 weeks please contact our office.  We recommend the following healthy lifestyle measures: - eat a healthy diet consisting of lots of vegetables, fruits, beans, nuts, seeds, healthy meats such as white chicken and fish and whole grains.  - avoid fried foods, fast food, processed foods, sodas, red meet and other fattening foods.  - get a least 150 minutes of aerobic exercise per week.      Return in about 1 year (around 08/09/2016), or if symptoms worsen or fail to improve.  Colin Benton R.

## 2015-08-10 NOTE — Progress Notes (Signed)
Pre visit review using our clinic review tool, if applicable. No additional management support is needed unless otherwise documented below in the visit note. 

## 2015-08-10 NOTE — Patient Instructions (Signed)
BEFORE YOU LEAVE: -prevnar 13 -lab  -We placed a referral for you as discussed for the ultrasound. It usually takes about 1-2 weeks to process and schedule this referral. If you have not heard from [place] regarding this appointment in 2 weeks please contact our office.  We recommend the following healthy lifestyle measures: - eat a healthy diet consisting of lots of vegetables, fruits, beans, nuts, seeds, healthy meats such as white chicken and fish and whole grains.  - avoid fried foods, fast food, processed foods, sodas, red meet and other fattening foods.  - get a least 150 minutes of aerobic exercise per week.

## 2015-08-11 LAB — HEPATITIS C ANTIBODY: HCV AB: NEGATIVE

## 2015-08-16 ENCOUNTER — Ambulatory Visit
Admission: RE | Admit: 2015-08-16 | Discharge: 2015-08-16 | Disposition: A | Discharge status: Home / Self Care | Payer: BLUE CROSS/BLUE SHIELD | Source: Ambulatory Visit | Attending: Family Medicine | Admitting: Family Medicine

## 2015-08-16 DIAGNOSIS — E041 Nontoxic single thyroid nodule: Secondary | ICD-10-CM

## 2015-08-16 IMAGING — US US SOFT TISSUE HEAD/NECK
1 series · 13 of 25 positions shown · non-contrast
Comparison: [DATE]

CLINICAL DATA: Thyroid nodules

EXAM:
THYROID ULTRASOUND
TECHNIQUE: Ultrasound examination of the thyroid gland and adjacent soft
tissues was performed.

[Series 1: us soft tissue head/neck · 0.09mm/px · 13 of 55 slices shown]
[im 1/55]
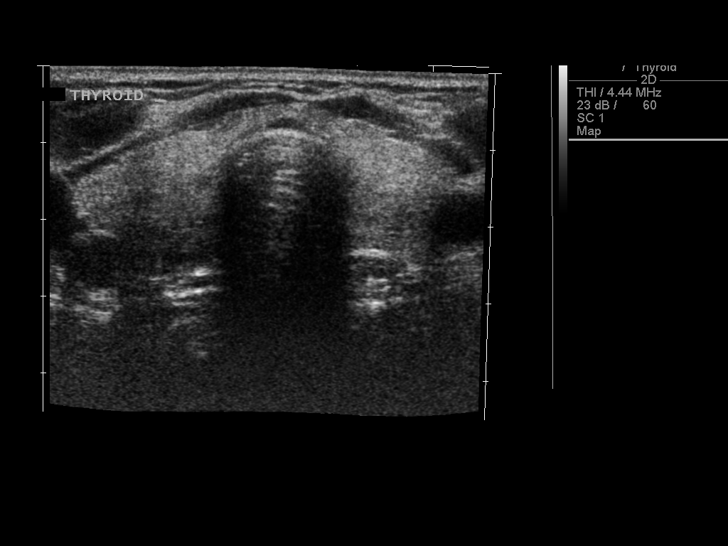
[im 5/55]
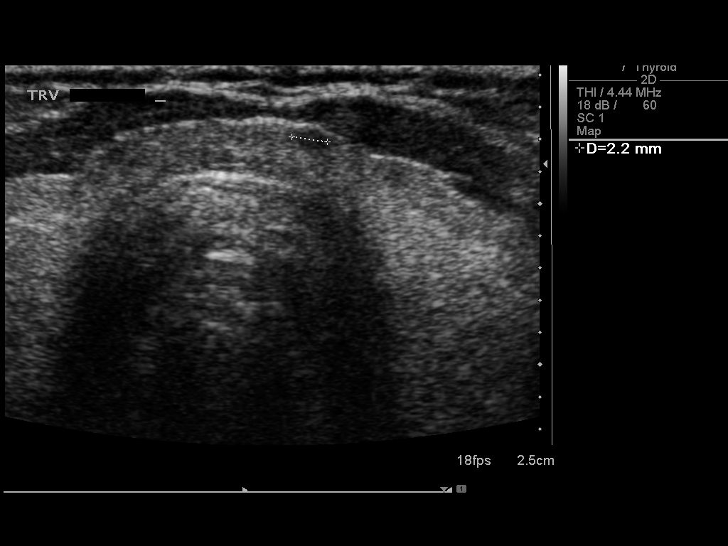
[im 10/55]
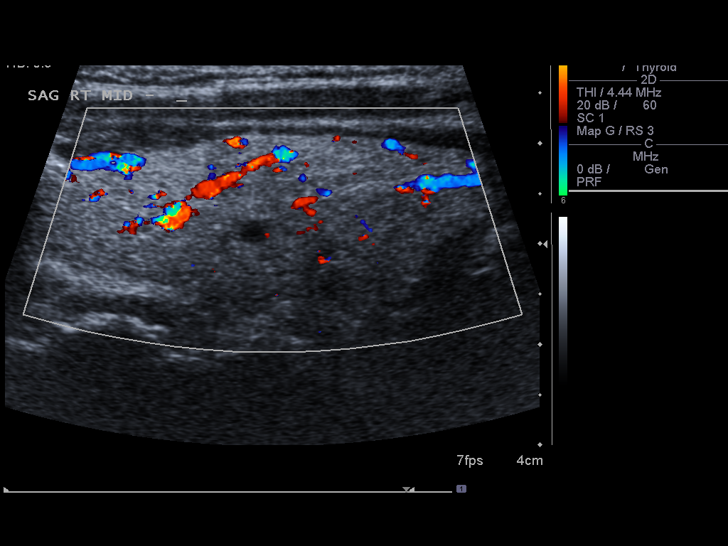
[im 14/55]
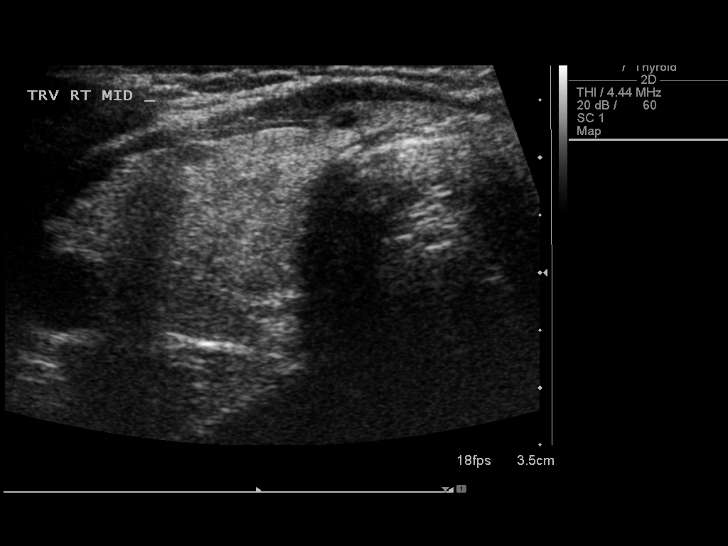
[im 19/55]
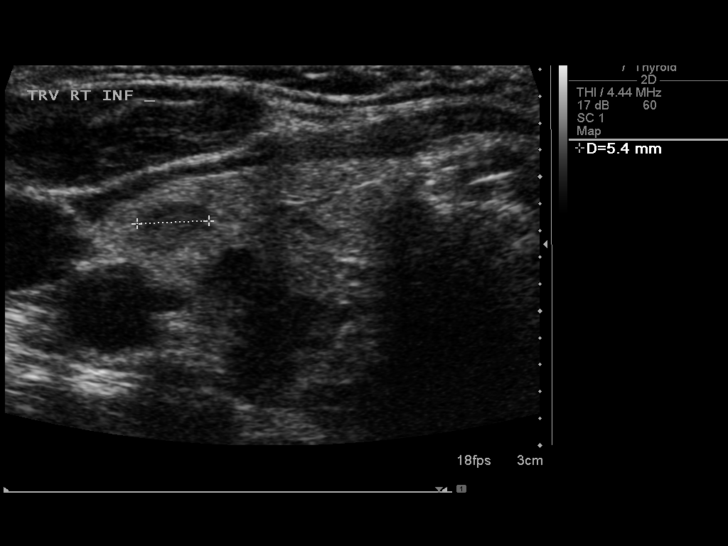
[im 23/55]
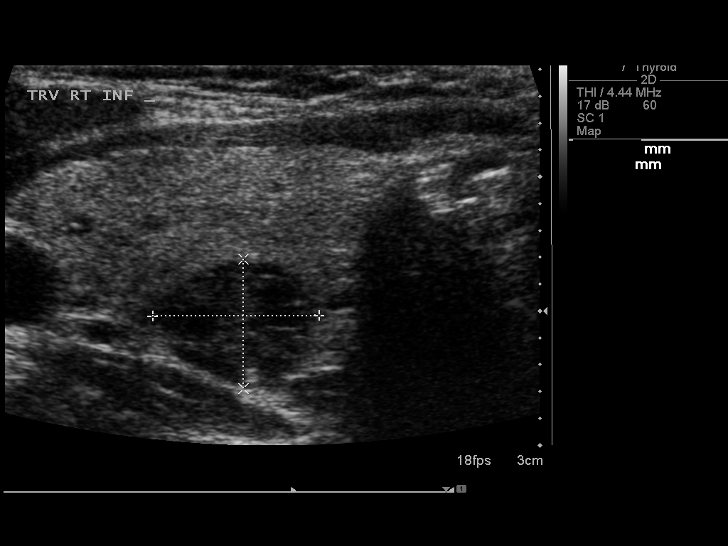
[im 28/55]
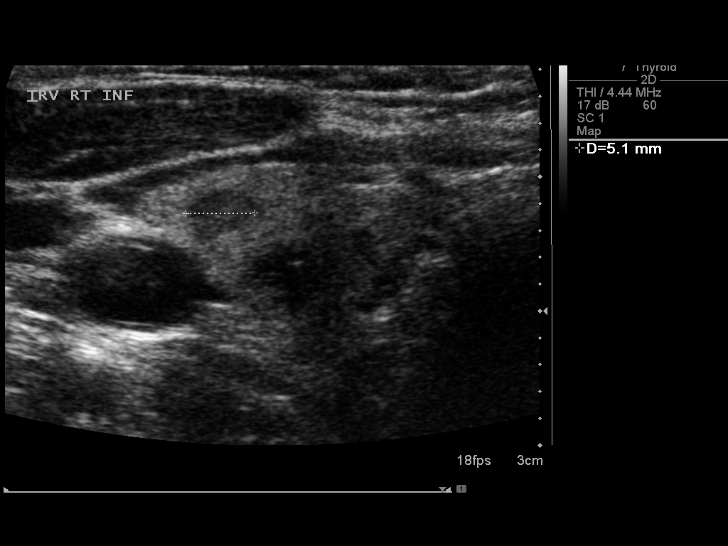
[im 32/55]
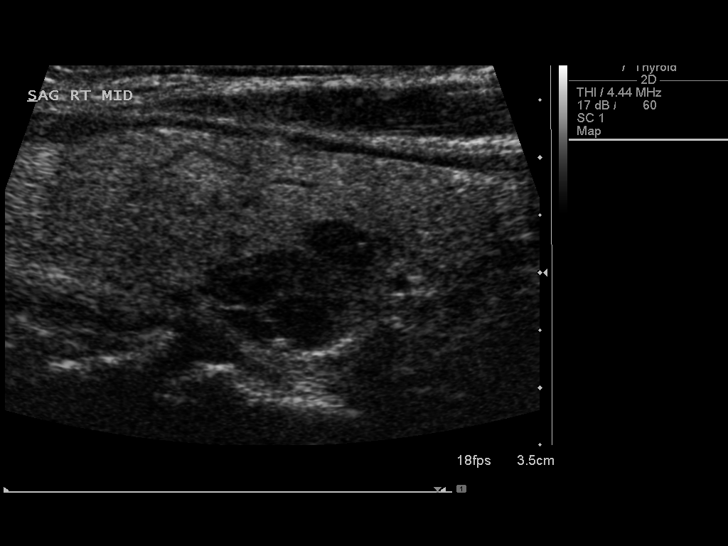
[im 37/55]
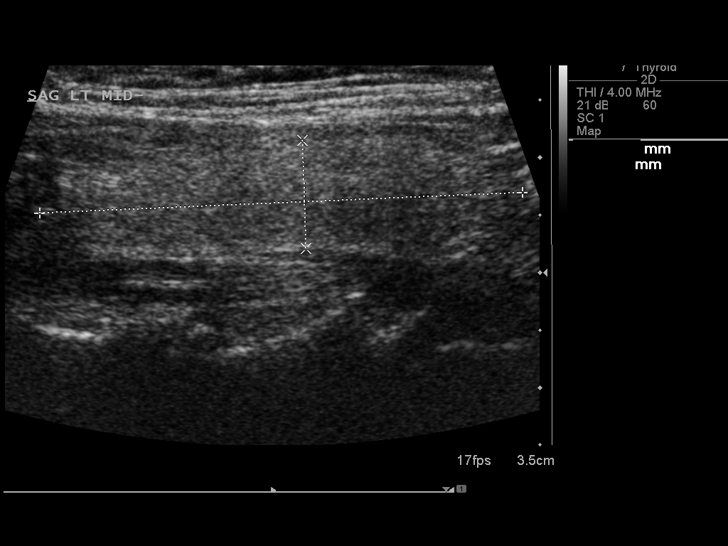
[im 41/55]
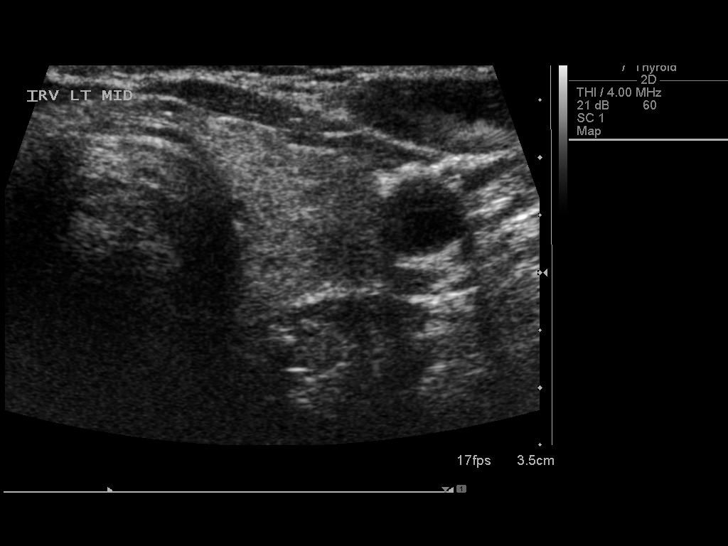
[im 46/55]
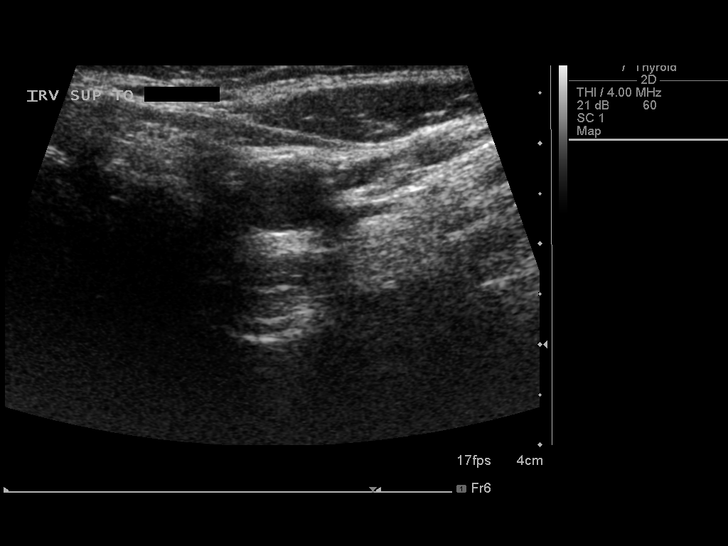
[im 50/55]
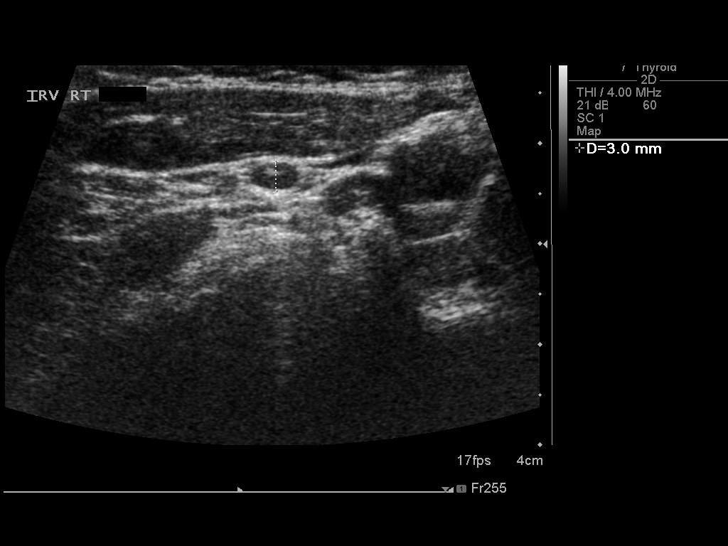
[im 55/55]
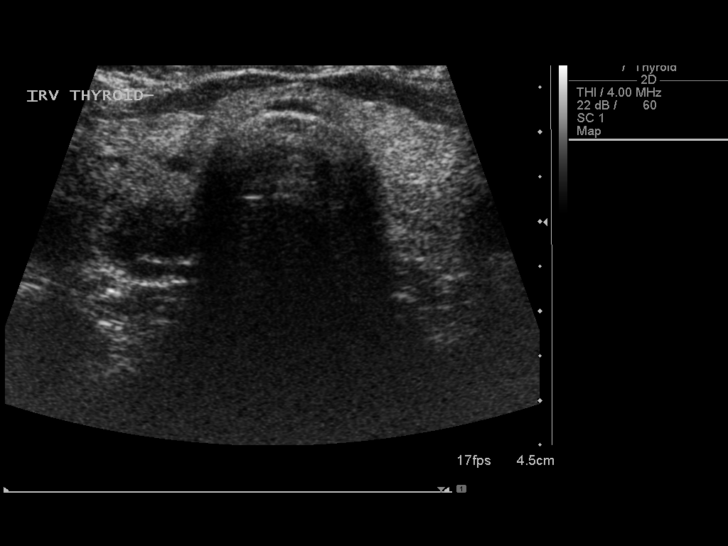

[13 of 25 positions shown; findings below may reference images not displayed]

FINDINGS: Right thyroid lobe

Measurements: 49 x 17 x 20 mm. Multiple small nodules. Largest 12 x
10 x 14 mm hypoechoic hypoechoic solid, and deep inferior pole
(previously 12 x 7 x 9). There is an adjacent 7 mm complex cyst.
Remainder of nodules less than 6 mm.

Left thyroid lobe

Measurements: 42 x 9 x 14 mm.  No nodules visualized.

Isthmus

Thickness: 2.4 mm. There are 2 small nodules, largest 5 mm to the
right of midline..

Lymphadenopathy

None visualized.
IMPRESSION: 1. Little increase in size of dominant right nodule. Findings do not
meet current consensus criteria for biopsy. Follow-up by clinical
exam is recommended. If patient has known risk factors for thyroid
carcinoma, consider follow-up ultrasound in 12 months. If patient is
clinically hyperthyroid, consider nuclear medicine thyroid uptake
and scan. This recommendation follows the consensus statement:
Management of Thyroid Nodules Detected as US: Society of
Radiologists in Ultrasound Consensus Conference Statement. Radiology

## 2015-08-18 ENCOUNTER — Telehealth: Payer: Self-pay | Admitting: Family Medicine

## 2015-08-18 NOTE — Telephone Encounter (Signed)
Pt would like a cb, thanks  [phone]

## 2015-08-18 NOTE — Telephone Encounter (Signed)
Patient informed. 

## 2016-02-22 ENCOUNTER — Ambulatory Visit: Payer: Self-pay

## 2016-02-22 ENCOUNTER — Encounter: Payer: Self-pay | Admitting: Podiatry

## 2016-02-22 ENCOUNTER — Ambulatory Visit (INDEPENDENT_AMBULATORY_CARE_PROVIDER_SITE_OTHER): Payer: BLUE CROSS/BLUE SHIELD | Admitting: Podiatry

## 2016-02-22 VITALS — BP 130/77 | HR 70 | Resp 16

## 2016-02-22 DIAGNOSIS — M722 Plantar fascial fibromatosis: Secondary | ICD-10-CM

## 2016-02-22 MED ORDER — TRIAMCINOLONE ACETONIDE 10 MG/ML IJ SUSP
10.0000 mg | Freq: Once | INTRAMUSCULAR | Status: AC
  Administered 2016-02-22: 10 mg

## 2016-02-22 NOTE — Progress Notes (Signed)
   Subjective:    Patient ID: Paula [name], female    DOB: 1951/05/13, 65 y.o.   MRN: [id number]  HPI  Pt presents with painful lump/cyst in her left foot arch area, lasting 2 weeks  Review of Systems  All other systems reviewed and are negative.      Objective:   Physical Exam        Assessment & Plan:

## 2016-02-25 NOTE — Progress Notes (Signed)
Subjective:     Patient ID: Paula [name], female   DOB: 1951-09-26, 65 y.o.   MRN: [id number]  HPI patient presents with a painful nodule in the left arch with no history of trauma. States she's only noticed it for the last few weeks and it is tender   Review of Systems  All other systems reviewed and are negative.      Objective:   Physical Exam  Constitutional: She is oriented to person, place, and time.  Cardiovascular: Intact distal pulses.   Musculoskeletal: Normal range of motion.  Neurological: She is oriented to person, place, and time.  Skin: Skin is warm.  Nursing note and vitals reviewed.  neurovascular status intact muscle strength adequate range of motion within normal limits with patient found to have small nodule in the plantar left arch measuring approximately 3 x 3 mm that is painful when pressed and makes walking difficult at times. It does appear to be within the plantar fascia itself     Assessment:     Probable plantar fibroma creating fasciitis-like symptomatology versus possible cyst    Plan:     H&P and x-rays and condition reviewed with patient. Today were to try to shrink it and I used cortisone 3 mg dexamethasone Kenalog 5 mg Xylocaine and advised on heat therapy. If it should grow in size change color or remained painful it we'll need to be taken out  X-ray report indicates no indication of calcification in the area of the small nodule

## 2016-04-04 ENCOUNTER — Telehealth: Payer: Self-pay | Admitting: Family Medicine

## 2016-04-04 NOTE — Telephone Encounter (Signed)
Pt call to ask for a call back would like to know what vaccinations she need to travel out of the country .Marland Kitchen

## 2016-04-04 NOTE — Telephone Encounter (Signed)
I called the pt and [facility] [phone] for information as to what she would need.  I gave her the dates of all injections she has noted in her chart here.

## 2016-04-11 ENCOUNTER — Ambulatory Visit (INDEPENDENT_AMBULATORY_CARE_PROVIDER_SITE_OTHER): Payer: BLUE CROSS/BLUE SHIELD | Admitting: Family Medicine

## 2016-04-11 ENCOUNTER — Encounter: Payer: Self-pay | Admitting: Family Medicine

## 2016-04-11 VITALS — BP 128/70 | HR 86 | Temp 98.3°F | Ht 64.75 in | Wt 158.2 lb

## 2016-04-11 DIAGNOSIS — R0982 Postnasal drip: Secondary | ICD-10-CM | POA: Diagnosis not present

## 2016-04-11 DIAGNOSIS — Z7189 Other specified counseling: Secondary | ICD-10-CM

## 2016-04-11 DIAGNOSIS — R05 Cough: Secondary | ICD-10-CM | POA: Diagnosis not present

## 2016-04-11 DIAGNOSIS — R059 Cough, unspecified: Secondary | ICD-10-CM

## 2016-04-11 DIAGNOSIS — Z7185 Encounter for immunization safety counseling: Secondary | ICD-10-CM

## 2016-04-11 MED ORDER — FLUTICASONE PROPIONATE 50 MCG/ACT NA SUSP: 2.0000 | Freq: Every day | NASAL | Status: DC

## 2016-04-11 MED ORDER — ALBUTEROL SULFATE HFA 108 (90 BASE) MCG/ACT IN AERS: 2.0000 | INHALATION_SPRAY | Freq: Four times a day (QID) | RESPIRATORY_TRACT | Status: DC | PRN

## 2016-04-11 NOTE — Patient Instructions (Signed)
Before you leave: -please schedule a follow-up visit in about 1-2 weeks  -Labs for hepatitis B immunity checked  Please start Afrin nasal spray twice daily for 5 days. This is available over-the-counter. Please do not use longer than 5 days.  Also, start Flonase nasal spray and use 2 sprays each nostril daily for 21 days.  Use the albuterol as needed for cough or wheezing.

## 2016-04-11 NOTE — Progress Notes (Signed)
Pre visit review using our clinic review tool, if applicable. No additional management support is needed unless otherwise documented below in the visit note. 

## 2016-04-11 NOTE — Progress Notes (Signed)
HPI:  Paula [name] Is a pleasant 65 year old here for an acute visit for several issues.  Cough and postnasal drip: -started 2-3 weeks ago, sister with the same at the same time -symptoms: cough, drainage, sore throat, fatigue - treated with some amox - but did not tolerate well so did not take full dose/course -hx chronic cough, saw pulm in the past for this and had PFT (she brings a copy of this), tried many treatments - has been better the last few years, albuterol helped in the past -denies: fevers, chills, body aches, sinus pain  Vaccine counseling: -she wonders if can get hep A here -also is not sure of immune status to hep b, thinks had one vaccine -also traveling to [place] and need typhoid vaccine  ROS: See pertinent positives and negatives per HPI.  Past Medical History  Diagnosis Date  . CARPAL TUNNEL SYNDROME, MILD 12/05/2008  . HSV 03/01/2010  . OSTEOPENIA 12/05/2008  . PARESTHESIA 09/01/2009  . Chronic cough - seeing pulmonologist in [place], [facility] 03/14/2014  . Waldenstroms macroglobulinemia - followed by Dr. Carolynn Sayers, [place], [place] 03/15/2014  . Leg skin lesion, left 01/30/2015    precancerous cells per patient    Past Surgical History  Procedure Laterality Date  . Orif tibia & fibula fractures    . Tonsillectomy and adenoidectomy    . Leg skin lesion  biopsy / excision Left 01/30/2015    Dr Santiago Glad Gould-precancerous cells per patient    Family History  Problem Relation Age of Onset  . Hypertension Mother   . Hypertension Brother   . Cancer Neg Hx     lung ca - mother's side  . Arthritis Neg Hx     osteo - mother's side  . Colon cancer Father 67  . Hypertension Son     Social History   Social History  . Marital Status: Married    Spouse Name: N/A  . Number of Children: N/A  . Years of Education: N/A   Social History Main Topics  . Smoking status: Never Smoker   . Smokeless tobacco: Never Used  . Alcohol Use: 3.6  oz/week    6 Glasses of wine per week  . Drug Use: No  . Sexual Activity: Not Asked   Other Topics Concern  . 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  albuterol (PROVENTIL HFA;VENTOLIN HFA) 108 (90 Base) MCG/ACT inhaler, Inhale 2 puffs into the lungs every 6 (six) hours as needed., Disp: 1 Inhaler, Rfl: 0 .  fluticasone (FLONASE) 50 MCG/ACT nasal spray, Place 2 sprays into both nostrils daily., Disp: 16 g, Rfl: 6  EXAM:  Filed Vitals:   04/11/16 1356  BP: 128/70  Pulse: 86  Temp: 98.3 F (36.8 C)    Body mass index is 26.52 kg/(m^2).  GENERAL: vitals reviewed and listed above, alert, oriented, appears well hydrated and in no acute distress  HEENT: atraumatic, conjunttiva clear, no obvious abnormalities on inspection of external nose and ears, normal appearance of ear canals and TMs, clear nasal congestion, mild post oropharyngeal erythema with PND, no tonsillar edema or exudate, no sinus TTP  NECK: no obvious masses on inspection  LUNGS: clear to auscultation bilaterally, no wheezes, rales or rhonchi,  good air movement  CV: HRRR, no peripheral edema  MS: moves all extremities without noticeable abnormality  PSYCH: pleasant and cooperative, no obvious depression or anxiety  ASSESSMENT AND PLAN:  Discussed the following assessment and plan:  Vaccine counseling - Plan: Hep B Surface Antibody  Cough - Plan: albuterol (PROVENTIL HFA;VENTOLIN HFA) 108 (90 Base) MCG/ACT inhaler  PND (post-nasal drip) - Plan: fluticasone (FLONASE) 50 MCG/ACT nasal spray  -Findings on exam suggest recovering from acute viral upper respiratory infection or seasonal allergies -Opted for trial of short course nasal decongestant,INS and prn albuterol -discussed vaccines and she opted to due hep b titer and see travel clinic per my advice -note:  mentions vertigo, chronic per her report as leaving and agrees to set up anther visit in the next few weeks to evaluation and discuss. -Patient advised to return or notify a doctor immediately if symptoms worsen or persist or new concerns arise.  Patient Instructions  Before you leave: -please schedule a follow-up visit in about 1-2 weeks  -Labs for hepatitis B immunity checked  Please start Afrin nasal spray twice daily for 5 days. This is available over-the-counter. Please do not use longer than 5 days.  Also, start Flonase nasal spray and use 2 sprays each nostril daily for 21 days.  Use the albuterol as needed for cough or wheezing.      Colin Benton R.

## 2016-04-12 LAB — HEPATITIS B SURFACE ANTIBODY,QUALITATIVE: HEP B S AB: POSITIVE — AB

## 2016-04-15 ENCOUNTER — Telehealth: Payer: Self-pay | Admitting: Family Medicine

## 2016-04-15 NOTE — Telephone Encounter (Signed)
Pt would like blood work result  °

## 2016-04-16 NOTE — Telephone Encounter (Signed)
Pt called needing blood work results b/c she is sch'd to have vac* on Thurs.

## 2016-04-16 NOTE — Telephone Encounter (Signed)
I left a message for the pt to return my call. 

## 2016-04-16 NOTE — Telephone Encounter (Signed)
I called the pt and informed her of the results. 

## 2016-04-22 IMAGING — US US ABDOMEN COMPLETE
1 series · 14 of 25 positions shown · non-contrast
Comparison: None.

CLINICAL DATA: Elevated liver enzymes, nausea and vomiting.

EXAM:
ABDOMEN ULTRASOUND COMPLETE

[Series 1: us abdomen complete · 0.20mm/px · 14 of 110 slices shown]
[im 1/110]
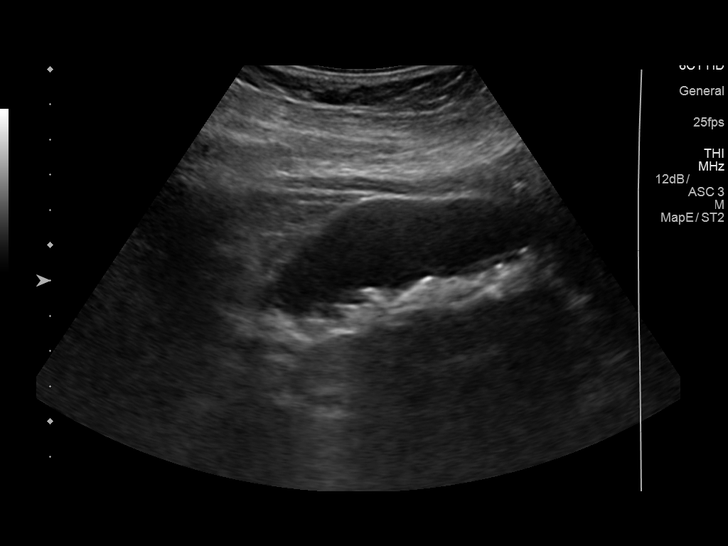
[im 10/110]
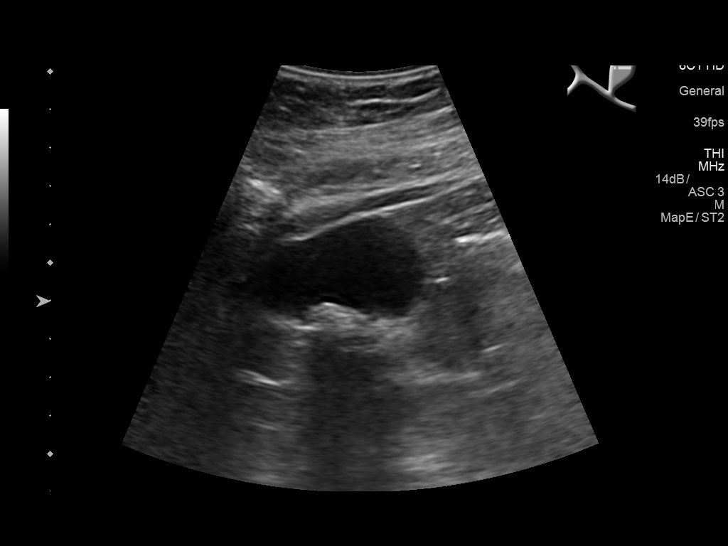
[im 19/110]
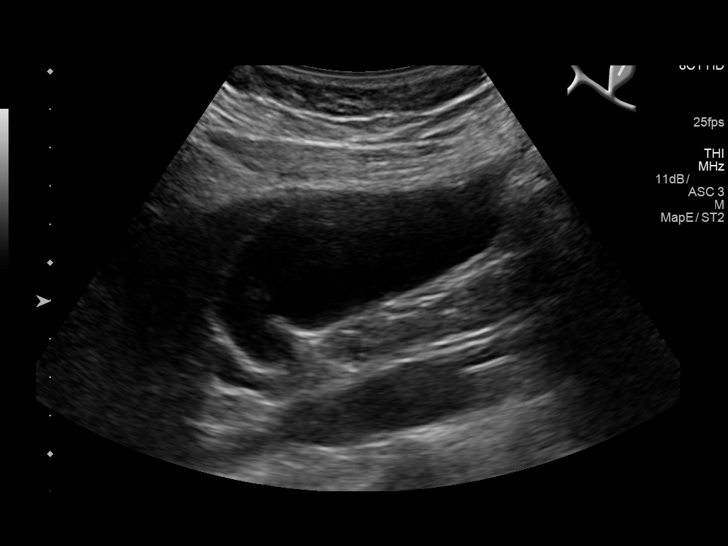
[im 28/110]
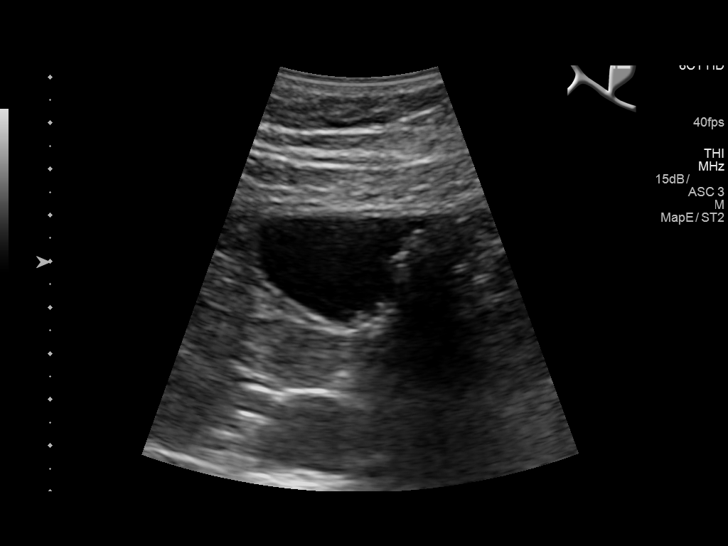
[im 37/110]
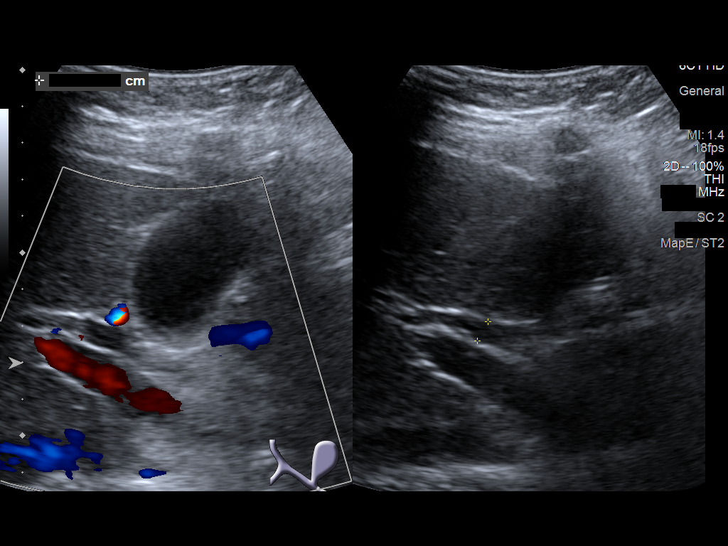
[im 41/110]
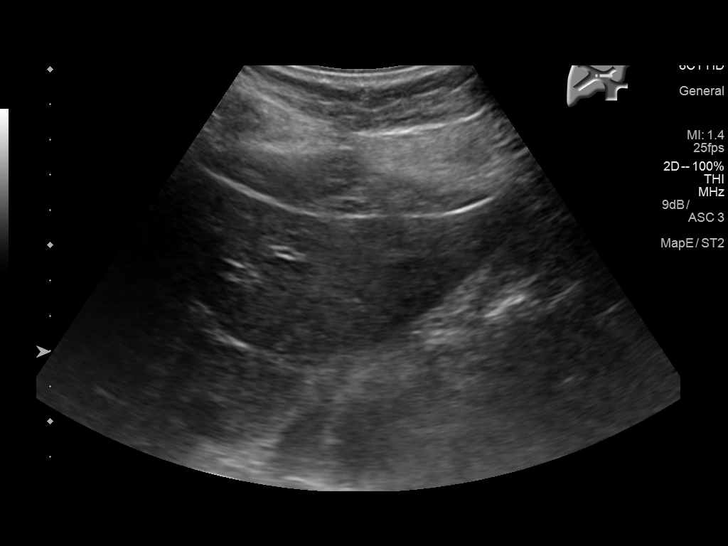
[im 50/110]
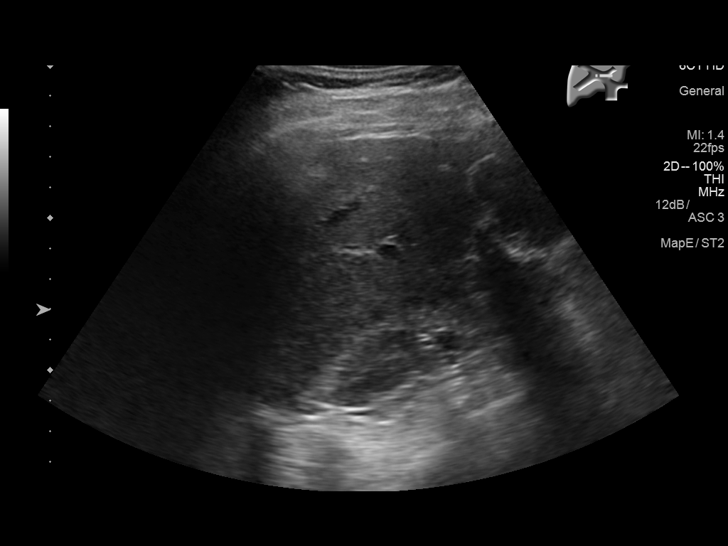
[im 60/110]
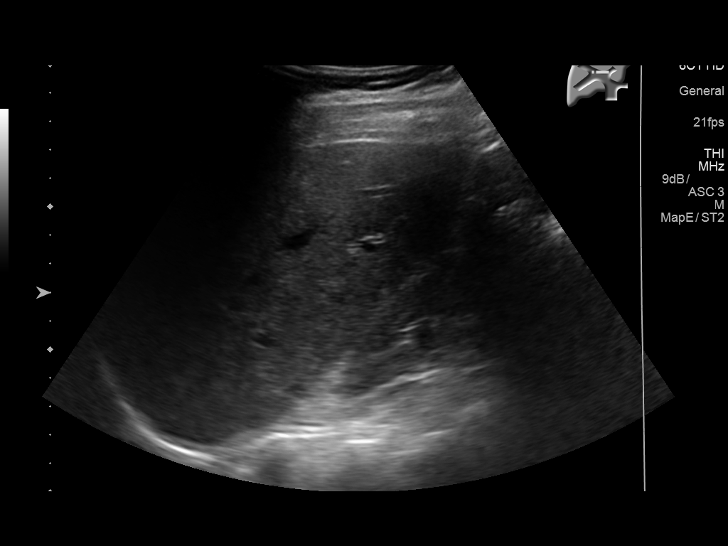
[im 69/110]
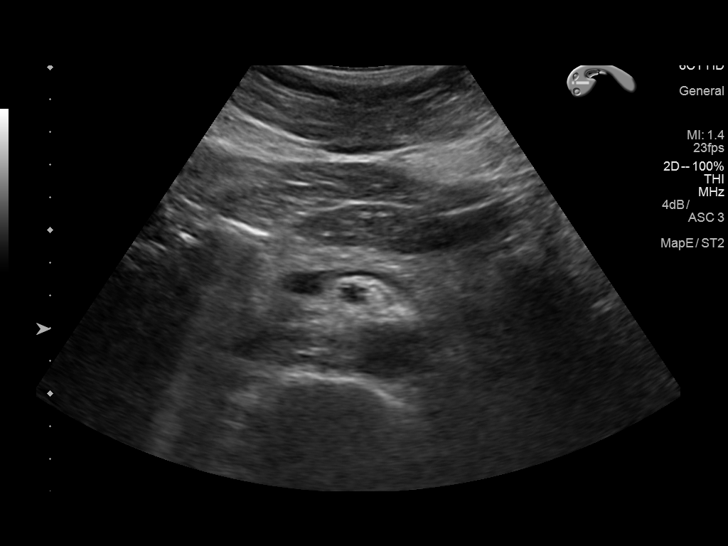
[im 73/110]
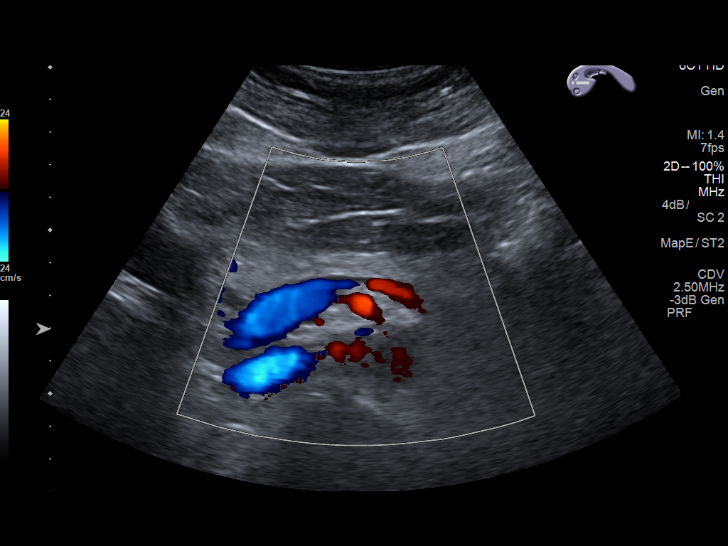
[im 82/110]
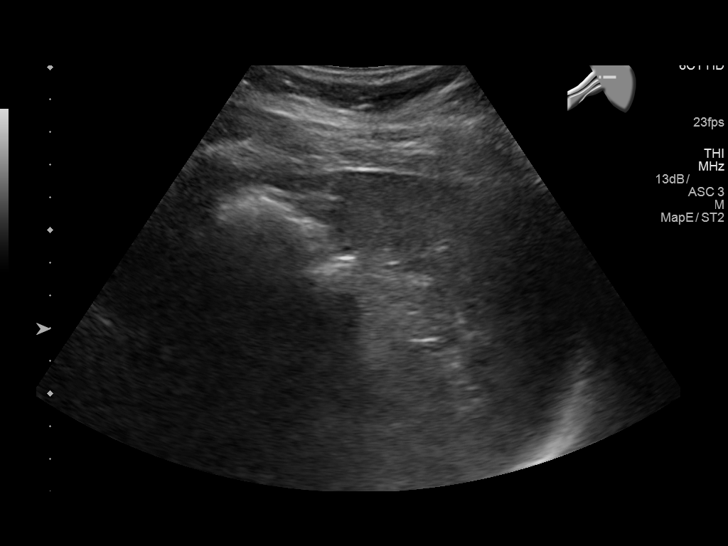
[im 91/110]
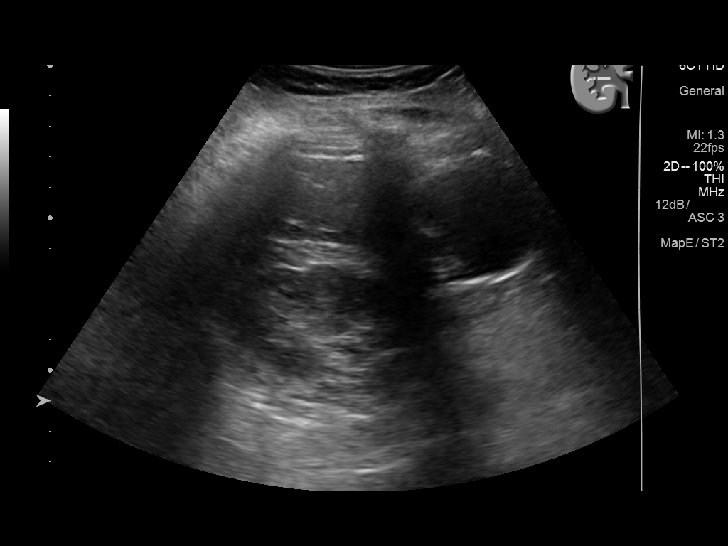
[im 100/110]
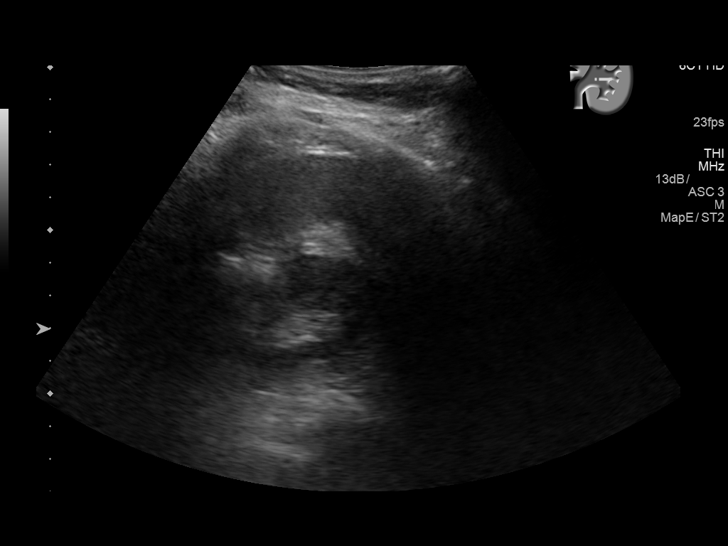
[im 110/110]
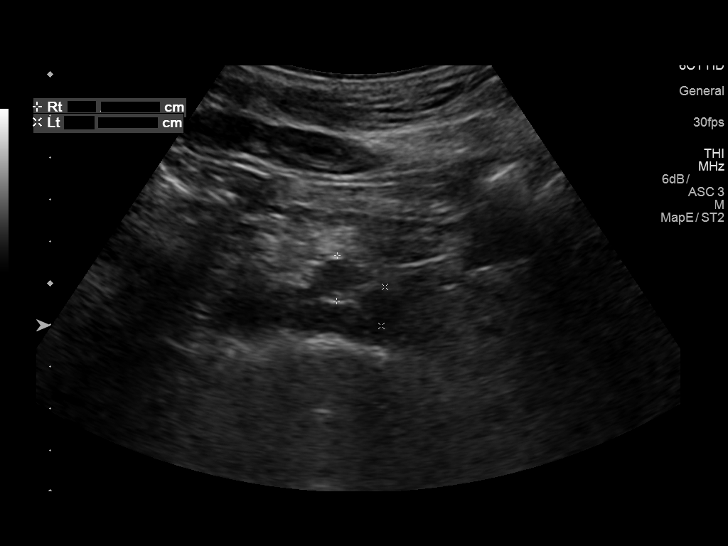

[14 of 25 positions shown; findings below may reference images not displayed]

FINDINGS: Gallbladder: Multiple intraluminal gallbladder calculi measuring up
to 11 mm. No gallbladder wall thickening or pericholecystic fluid.
The patient was not tender over the gallbladder.

Common bile duct: Diameter: 6.0 mm.

Liver: No focal lesion identified. Within normal limits in
parenchymal echogenicity.

IVC: No abnormality visualized.

Pancreas: Visualized portion unremarkable.

Spleen: Size and appearance within normal limits.

Right Kidney: Length: 9.9 cm. Echogenicity within normal limits. No
mass or hydronephrosis visualized.

Left Kidney: Length: 10.4 cm. Echogenicity within normal limits. No
mass or hydronephrosis visualized.

Abdominal aorta: No aneurysm visualized.

Other findings: None.
IMPRESSION: Cholelithiasis without sonographic evidence of cholecystitis. Normal
liver and bile ducts.

## 2016-04-25 ENCOUNTER — Ambulatory Visit (INDEPENDENT_AMBULATORY_CARE_PROVIDER_SITE_OTHER): Payer: BLUE CROSS/BLUE SHIELD | Admitting: Family Medicine

## 2016-04-25 ENCOUNTER — Encounter: Payer: Self-pay | Admitting: Family Medicine

## 2016-04-25 VITALS — BP 130/70 | HR 84 | Temp 97.7°F | Ht 64.75 in | Wt 159.7 lb

## 2016-04-25 DIAGNOSIS — J3489 Other specified disorders of nose and nasal sinuses: Secondary | ICD-10-CM

## 2016-04-25 DIAGNOSIS — R300 Dysuria: Secondary | ICD-10-CM

## 2016-04-25 DIAGNOSIS — J309 Allergic rhinitis, unspecified: Secondary | ICD-10-CM | POA: Diagnosis not present

## 2016-04-25 DIAGNOSIS — R42 Dizziness and giddiness: Secondary | ICD-10-CM

## 2016-04-25 LAB — POCT URINALYSIS DIPSTICK
Bilirubin, UA: NEGATIVE
Blood, UA: NEGATIVE
Glucose, UA: NEGATIVE
KETONES UA: NEGATIVE
LEUKOCYTES UA: NEGATIVE
NITRITE UA: NEGATIVE
PH UA: 5
PROTEIN UA: NEGATIVE
Spec Grav, UA: >=1.03
Urobilinogen, UA: 0.2

## 2016-04-25 MED ORDER — MECLIZINE HCL 12.5 MG PO TABS: 12.5000 mg | ORAL_TABLET | Freq: Three times a day (TID) | ORAL | Status: DC | PRN

## 2016-04-25 NOTE — Patient Instructions (Signed)
Before you leave: -udip with reflex culture if needed -Schedule follow-up in 4 weeks  For the vertigo: -Sent a referral for vestibular rehabilitation -Please do not drive when you're having vertigo -Can use the meclizine if needed per instructions -We will need further evaluation if symptoms despite treatment  For the allergies: -Start Claritin or Zyrtec daily -Can also start Flonase 2 sprays each nostril daily for 1 month -Follow up as needed  For the urinary incontinence: -Please see her gynecologist as planned and let us know if we can assist further -the Always Incontinence Pads are great

## 2016-04-25 NOTE — Progress Notes (Signed)
HPI: Paula [name] a pleasant 65 year old here for a number of issues today.  Vertigo: - started 6 weeks ago - now reports is much better -Symptoms include brief episodes of vertigo when turning her head to the left -Denies hearing loss, headaches, tinnitus, falls, weakness, numbness or vision changes -improving with exercises she found him line, but still having occasional spells with certain movements of the head  Pustule L nostril: -seeing derm, has rosacea, had LN2 to a different part of the nose recently -Her dermatologist put her on minocycline for this, she has not been using Flonase because of this - she has follow-up with her dermatologist for this  AllergicRhinitis: -not treating -Gets postnasal drip and globus sensation with coughing when she goes outside -Denies fevers, chills, sinus pain, shortness of breath  Dysuria: - started 6 weeks ago and reports she was seen in urgent care and was treated with an antibiotic for UTI -Reports the dysuria has resolved mostly, but her incontinence still seems slightly worse than baseline -she plans to see her gynecologist about her incontinence - however, she has had mixed incontinence  symptoms for 4 years and she is getting tired of it -She has tried Kegel exercises -Hematuria, burning with urination, flank pain, nausea, vomiting, fevers   ROS: See pertinent positives and negatives per HPI.  Past Medical History  Diagnosis Date  . CARPAL TUNNEL SYNDROME, MILD 12/05/2008  . HSV 03/01/2010  . OSTEOPENIA 12/05/2008  . PARESTHESIA 09/01/2009  . Chronic cough - seeing pulmonologist in [place], [facility] 03/14/2014  . Waldenstroms macroglobulinemia - followed by Dr. Carolynn Sayers, [place], [place] 03/15/2014  . Leg skin lesion, left 01/30/2015    precancerous cells per patient    Past Surgical History  Procedure Laterality Date  . Orif tibia & fibula fractures    . Tonsillectomy and adenoidectomy    . Leg skin  lesion  biopsy / excision Left 01/30/2015    Dr Santiago Glad Gould-precancerous cells per patient    Family History  Problem Relation Age of Onset  . Hypertension Mother   . Hypertension Brother   . Cancer Neg Hx     lung ca - mother's side  . Arthritis Neg Hx     osteo - mother's side  . Colon cancer Father 34  . Hypertension Son     Social History   Social History  . Marital Status: Married    Spouse Name: N/A  . Number of Children: N/A  . Years of Education: N/A   Social History Main Topics  . Smoking status: Never Smoker   . Smokeless tobacco: Never Used  . Alcohol Use: 3.6 oz/week    6 Glasses of wine per week  . Drug Use: No  . Sexual Activity: Not Asked   Other Topics Concern  . 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  albuterol (PROVENTIL HFA;VENTOLIN HFA) 108 (90 Base) MCG/ACT inhaler, Inhale 2 puffs into the lungs every 6 (six) hours as needed., Disp: 1 Inhaler, Rfl: 0 .  fluticasone (FLONASE) 50 MCG/ACT nasal spray, Place 2 sprays into both nostrils daily., Disp: 16 g, Rfl: 6 .  minocycline (DYNACIN) 50 MG tablet, , Disp: , Rfl:  .  meclizine (ANTIVERT) 12.5 MG  tablet, Take 1 tablet (12.5 mg total) by mouth 3 (three) times daily as needed for dizziness., Disp: 30 tablet, Rfl: 0  EXAM:  Filed Vitals:   04/25/16 1604  BP: 130/70  Pulse: 84  Temp: 97.7 F (36.5 C)    Body mass index is 26.77 kg/(m^2).  GENERAL: vitals reviewed and listed above, alert, oriented, appears well hydrated and in no acute distress  HEENT: atraumatic, conjunttiva clear, no obvious abnormalities on inspection of external nose and ears, normal appearance of ear canals and TMs except for clear effusions bilaterally, clear nasal congestion, boggy turbinates, mild post oropharyngeal erythema with PND with cobblestoning, no tonsillar edema or  exudate, no sinus TTP  NECK: no obvious masses on inspection  LUNGS: clear to auscultation bilaterally, no wheezes, rales or rhonchi, good air movement  CV: HRRR, no peripheral edema  MS: moves all extremities without noticeable abnormality  SKIN: Very small pinpoint papule just inside the left nostril  PSYCH/neuro : pleasant and cooperative, no obvious depression or anxiety, gait normal, cranial nerves II through XII grossly intact, finger to nose normal, [employer] is positive to the left with rotational nystagmus and reproduction of her symptoms   ASSESSMENT AND PLAN:  Discussed the following assessment and plan:  Vertigo - Plan: Ambulatory referral to Physical Therapy -we discussed possible serious and likely etiologies, workup and treatment, treatment risks and return precautions - likely BPPV -after this discussion, Zyann opted for vestibular rehabilitation, meclizine as needed, no driving with symptoms and follow-up if symptoms persist; advised if persistent symptoms would need MRI and ENT or neurology evaluation -of course, we advised Davene  to return or notify a doctor immediately if symptoms worsen or persist or new concerns arise.  Nasal lesion -Very very small papule just inside the left nostril, advised recheck with her dermatologist when she follows up soon  Allergic rhinitis, unspecified allergic rhinitis type -INS, Antihistamine -Follow up as needed  Dysuria -UA clear  -She plans to see her gynecologist about this   -Patient advised to return or notify a doctor immediately if symptoms worsen or persist or new concerns arise.  Patient Instructions  Before you leave: -udip with reflex culture if needed -Schedule follow-up in 4 weeks  For the vertigo: -Sent a referral for vestibular rehabilitation -Please do not drive when you're having vertigo -Can use the meclizine if needed per instructions -We will need further evaluation if symptoms despite  treatment  For the allergies: -Start Claritin or Zyrtec daily -Can also start Flonase 2 sprays each nostril daily for 1 month -Follow up as needed  For the urinary incontinence: -Please see her gynecologist as planned and let us know if we can assist further -the Always Incontinence Pads are great     Delina Kruczek R.

## 2016-04-25 NOTE — Progress Notes (Signed)
Pre visit review using our clinic review tool, if applicable. No additional management support is needed unless otherwise documented below in the visit note. 

## 2016-05-02 ENCOUNTER — Ambulatory Visit: Payer: BLUE CROSS/BLUE SHIELD | Attending: Family Medicine

## 2016-05-02 DIAGNOSIS — R42 Dizziness and giddiness: Secondary | ICD-10-CM | POA: Diagnosis present

## 2016-05-02 DIAGNOSIS — H8112 Benign paroxysmal vertigo, left ear: Secondary | ICD-10-CM | POA: Insufficient documentation

## 2016-05-02 DIAGNOSIS — R2689 Other abnormalities of gait and mobility: Secondary | ICD-10-CM | POA: Insufficient documentation
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
## 2016-05-17 ENCOUNTER — Ambulatory Visit: Payer: BLUE CROSS/BLUE SHIELD

## 2016-05-28 ENCOUNTER — Ambulatory Visit: Payer: BLUE CROSS/BLUE SHIELD | Admitting: Physical Therapy

## 2016-05-28 DIAGNOSIS — H8112 Benign paroxysmal vertigo, left ear: Secondary | ICD-10-CM

## 2016-05-28 NOTE — Patient Instructions (Addendum)
Benign Positional Vertigo Vertigo is the feeling that you or your surroundings are moving when they are not. Benign positional vertigo is the most common form of vertigo. The cause of this condition is not serious (is benign). This condition is triggered by certain movements and positions (is positional). This condition can be dangerous if it occurs while you are doing something that could endanger you or others, such as driving.  CAUSES In many cases, the cause of this condition is not known. It may be caused by a disturbance in an area of the inner ear that helps your brain to sense movement and balance. This disturbance can be caused by a viral infection (labyrinthitis), head injury, or repetitive motion. RISK FACTORS This condition is more likely to develop in:  Women.  People who are 50 years of age or older. SYMPTOMS Symptoms of this condition usually happen when you move your head or your eyes in different directions. Symptoms may start suddenly, and they usually last for less than a minute. Symptoms may include:  Loss of balance and falling.  Feeling like you are spinning or moving.  Feeling like your surroundings are spinning or moving.  Nausea and vomiting.  Blurred vision.  Dizziness.  Involuntary eye movement (nystagmus). Symptoms can be mild and cause only slight annoyance, or they can be severe and interfere with daily life. Episodes of benign positional vertigo may return (recur) over time, and they may be triggered by certain movements. Symptoms may improve over time. DIAGNOSIS This condition is usually diagnosed by medical history and a physical exam of the head, neck, and ears. You may be referred to a health care provider who specializes in ear, nose, and throat (ENT) problems (otolaryngologist) or a provider who specializes in disorders of the nervous system (neurologist). You may have additional testing, including:  MRI.  A CT scan.  Eye movement tests. Your  health care provider may ask you to change positions quickly while he or she watches you for symptoms of benign positional vertigo, such as nystagmus. Eye movement may be tested with an electronystagmogram (ENG), caloric stimulation, the Dix-Hallpike test, or the roll test.  An electroencephalogram (EEG). This records electrical activity in your brain.  Hearing tests. TREATMENT Usually, your health care provider will treat this by moving your head in specific positions to adjust your inner ear back to normal. Surgery may be needed in severe cases, but this is rare. In some cases, benign positional vertigo may resolve on its own in 2-4 weeks. HOME CARE INSTRUCTIONS Safety  Move slowly.Avoid sudden body or head movements.  Avoid driving.  Avoid operating heavy machinery.  Avoid doing any tasks that would be dangerous to you or others if a vertigo episode would occur.  If you have trouble walking or keeping your balance, try using a cane for stability. If you feel dizzy or unstable, sit down right away.  Return to your normal activities as told by your health care provider. Ask your health care provider what activities are safe for you. General Instructions  Take over-the-counter and prescription medicines only as told by your health care provider.  Avoid certain positions or movements as told by your health care provider.  Drink enough fluid to keep your urine clear or pale yellow.  Keep all follow-up visits as told by your health care provider. This is important. SEEK MEDICAL CARE IF:  You have a fever.  Your condition gets worse or you develop new symptoms.  Your family or friends   notice any behavioral changes.  Your nausea or vomiting gets worse.  You have numbness or a "pins and needles" sensation. SEEK IMMEDIATE MEDICAL CARE IF:  You have difficulty speaking or moving.  You are always dizzy.  You faint.  You develop severe headaches.  You have weakness in your  legs or arms.  You have changes in your hearing or vision.  You develop a stiff neck.  You develop sensitivity to light.   This information is not intended to replace advice given to you by your health care provider. Make sure you discuss any questions you have with your health care provider.   Document Released: 09/23/2006 Document Revised: 09/06/2015 Document Reviewed: 04/10/2015 Elsevier Interactive Patient Education 2016 [place] for Horizontal Canalithiasis - Left Side Affected    Self Treatment for Left Posterior / Anterior Canalithiasis    Sitting on bed: 1. Turn head 45 left. (a) Lie back slowly, shoulders on pillow, head on bed. (b) Hold _30___ seconds. 2. Keeping head on bed, turn head 90 right. Hold _30___ seconds. 3. Roll to right, head on 45 angle down toward bed. Hold _30___ seconds. 4. Sit up on right side of bed. Repeat __3_ times per session. Do _2___ sessions per day.  Copyright  VHI. All rights reserved.  Copyright  VHI. All rights reserved.

## 2016-05-28 NOTE — Therapy (Signed)
[facility] [address] [place], [place], [address] Phone: [phone]   Fax:  [phone]  Physical Therapy Treatment  Patient Details  Name: Paula [name] MRN: [id number] Date of Birth: Dec 03, 1951 Referring Provider: Dr. Maudie Mercury  Encounter Date: 05/28/2016      PT End of Session - 05/28/16 1111    Visit Number 2   Date for PT Re-Evaluation 06/01/16   Authorization Type BCBS   PT Start Time [phone]  pt arrived late due to traffic jam   PT Stop Time 0847   PT Time Calculation (min) 31 min      Past Medical History  Diagnosis Date  . CARPAL TUNNEL SYNDROME, MILD 12/05/2008  . HSV 03/01/2010  . OSTEOPENIA 12/05/2008  . PARESTHESIA 09/01/2009  . Chronic cough - seeing pulmonologist in [place], [facility] 03/14/2014  . Waldenstroms macroglobulinemia - followed by Dr. Carolynn Sayers, [place], [place] 03/15/2014  . Leg skin lesion, left 01/30/2015    precancerous cells per patient    Past Surgical History  Procedure Laterality Date  . Orif tibia & fibula fractures    . Tonsillectomy and adenoidectomy    . Leg skin lesion  biopsy / excision Left 01/30/2015    Dr Santiago Glad Gould-precancerous cells per patient    There were no vitals filed for this visit.      Subjective Assessment - 05/28/16 1106    Subjective Pt reports dizziness is much improved - had leg surgery 2 weeks ago so has not been doing Pilates or turning in bed very much due to keeping L leg elevated; wants another treatment due to still feeling some vertigo at times                                                                                                                                                                               Pertinent History Non-hodgkins lymphoma but pt reports not progressing and she gets blood tests frequently   Patient Stated Goals Address vertigo and ear issues.   Currently in Pain? No/denies       (+) L  Dix-Hallpike test with rotary upbeating nystagmus and c/o vertigo in test position  Epley maneuver performed 3 reps with continued nystagmus in initlal position of maneuver (supine) on 3rd rep  but of less intensity and duration compared with that on 1st rep of maneuver -- pt reported less intense vertigo on 3rd rep   Self Care;  Discussed etiology of BPPV - pt was given pamphlet for pt education Instructed in Brandt-Daroff exercises/Epley maneuver for habituation if BPPV not fully resolved  PT Education - 05/28/16 1107    Education provided Yes   Education Details BPPV information (brochure); instructed pt to do either Epley maneuver or Brandt-Daroff exercises at home if vertigo not fully resolved   Person(s) Educated Patient   Methods Explanation;Demonstration;Handout   Comprehension Verbalized understanding          PT Short Term Goals - 05/02/16 1215    PT SHORT TERM GOAL #1   Title same as LTGs.           PT Long Term Goals - 05/02/16 1215    PT LONG TERM GOAL #1   Title Pt will report 0/10 dizziness while performing ADLs, pilates and head turns to improve safety during functional mobility. Target date: 05/30/16   Status New   PT LONG TERM GOAL #2   Title Perform FGA and write goal if appropriate. Target date: 05/30/16   Status New   PT LONG TERM GOAL #3   Title Pt will amb. 1000' while performing head turns over even/uneven terrain IND to improve functional mobility. Target date: 05/30/16   Status New               Plan - 05/28/16 1112    Clinical Impression Statement Pt continued to have L upbeating rotary nystagmus on 3rd rep of Epley's - indicative that L BPPV did not fully resolve with today's treatment; pt only reported dizziness in 1st position of Epley's maneuver on all 3 reps - pt did report that vertigo was not as intense  on 3rd rep as it was initially today   Rehab Potential Excellent   PT Frequency 1x / week   PT  Duration 4 weeks   PT Treatment/Interventions ADLs/Self Care Home Management;Biofeedback;Canalith Repostioning;Therapeutic exercise;Manual techniques;Vestibular;Balance training;Therapeutic activities;Functional mobility training;Stair training;Gait training;DME Instruction;Patient/family education;Neuromuscular re-education   PT Next Visit Plan Re-assess for L pBPPV and perform FGA. D/c if balance is WNL and BPPV cleared.   PT Home Exercise Plan instructed in Epley/Brandt-Daroff exercises for habituation   Consulted and Agree with Plan of Care Patient      Patient will benefit from skilled therapeutic intervention in order to improve the following deficits and impairments:  Dizziness, Decreased balance  Visit Diagnosis: BPPV (benign paroxysmal positional vertigo), left     Problem List Patient Active Problem List   Diagnosis Date Noted  . Vaccine contraindicated - live vaccines contraindicated 04/20/2014  . Waldenstroms macroglobulinemia - followed by Dr. Carolynn Sayers, [place], [place] 03/15/2014  . Chronic cough - seeing pulmonologist in [place], [facility] 03/14/2014  . OSTEOPENIA 12/05/2008    Alda Lea, PT 05/28/2016, 11:16 AM  [facility] [address] [place], [place], [address] Phone: [phone]   Fax:  [phone]  Name: Paula [name] MRN: [id number] Date of Birth: 1951/10/02

## 2016-06-28 ENCOUNTER — Telehealth: Payer: Self-pay | Admitting: Family Medicine

## 2016-06-28 NOTE — Telephone Encounter (Signed)
Does she mean scopalamine? For motion sickeness? Will she be on a ship? Can use dramamine over the counter. Do you know what they advised zpack for? Ok to send rx for one zpack. Thanks.

## 2016-06-28 NOTE — Telephone Encounter (Signed)
Pt would like to know if Dr Maudie Mercury will fill Rx for azithromycin scop amine (several if comes in pack)  Pt states she discussed with Dr Maudie Mercury her upcoming trip to [place]. Pt went to the health dept and received her rx for   malaria, and yellow fever injection. However, they recommend the above scripts. [place]

## 2016-07-01 MED ORDER — AZITHROMYCIN 250 MG PO TABS: ORAL_TABLET | ORAL | Status: DC

## 2016-07-01 NOTE — Telephone Encounter (Signed)
I called the pt and informed her of the message below.  She is aware the Rx for the Z-pack was sent to her pharmacy which she stated she needed for a remote area she is traveling to in case of food poisioning.  She wanted to know if Dr Maudie Mercury would send in the Rx for the Scopalamine patches to take with the Dramamine?  Patient is aware this will be addressed next week when Dr Maudie Mercury returns to the office.

## 2016-07-01 NOTE — Addendum Note (Signed)
Addended by: Lahoma Crocker A on: 07/01/2016 11:15 AM   Modules accepted: Orders

## 2016-07-03 NOTE — Telephone Encounter (Signed)
occ rx scopalamine for a cruise. DO not use WITH dramamine. Can use one or the other. Does have risks and she can read about and discuss with pharmacist. Please find out how long the cruise is and rx patches enough for cruise length to use if needed, change every 3 days. Start 4 hours before cruise. Thanks.

## 2016-07-04 NOTE — Telephone Encounter (Signed)
I left a message for the pt to return my call. 

## 2016-07-18 NOTE — Telephone Encounter (Signed)
I left a message for the pt to return my call. 

## 2016-08-13 NOTE — Therapy (Signed)
[facility] [address] [place], [place], [address] Phone: [phone]   Fax:  [phone]  Patient Details  Name: Paula [name] MRN: [id number] Date of Birth: 03/27/1951 Referring Provider:  No ref. provider found  Encounter Date: 08-17-16  PHYSICAL THERAPY DISCHARGE SUMMARY  Visits from Start of Care: 2  Current functional level related to goals / functional outcomes:     PT Long Term Goals - 05/02/16 1215      PT LONG TERM GOAL #1   Title Pt will report 0/10 dizziness while performing ADLs, pilates and head turns to improve safety during functional mobility. Target date: 05/30/16   Status New     PT LONG TERM GOAL #2   Title Perform FGA and write goal if appropriate. Target date: 05/30/16   Status New     PT LONG TERM GOAL #3   Title Pt will amb. 1000' while performing head turns over even/uneven terrain IND to improve functional mobility. Target date: 05/30/16   Status New        Remaining deficits: Unknown, as pt did not return after second visit. She did call and state she was better and cancelled remaining appt's but unable to assess goals.   Education / Equipment: HEP  Plan: Patient agrees to discharge.  Patient goals were not met. Patient is being discharged due to not returning since the last visit.  ?????         G-Codes - 08/17/16 1351    Functional Assessment Tool Used 1/10 dizziness when looking up, turning head while amb. and performing Pilates (same as upon eval, as pt returned for one visit only and then cancelled)   Mobility: Walking and Moving Around Current Status ([id number]) At least 1 percent but less than 20 percent impaired, limited or restricted   Mobility: Walking and Moving Around Goal Status [phone]) At least 1 percent but less than 20 percent impaired, limited or restricted      Paula [name] L 17-Aug-2016, 1:52 PM  [facility] [address] [place], [place], [address] Phone: [phone]   Fax:  [phone]   Geoffry Paradise, PT,DPT 08/17/16 1:52 PM Phone: [phone] Fax: [phone]

## 2017-01-28 ENCOUNTER — Emergency Department (HOSPITAL_COMMUNITY): Payer: BLUE CROSS/BLUE SHIELD

## 2017-01-28 ENCOUNTER — Emergency Department (HOSPITAL_COMMUNITY)
Admission: EM | Admit: 2017-01-28 | Discharge: 2017-01-29 | Disposition: A | Discharge status: Home / Self Care | Payer: BLUE CROSS/BLUE SHIELD | Attending: Emergency Medicine | Admitting: Emergency Medicine

## 2017-01-28 ENCOUNTER — Encounter (HOSPITAL_COMMUNITY): Payer: Self-pay | Admitting: Emergency Medicine

## 2017-01-28 DIAGNOSIS — R1013 Epigastric pain: Secondary | ICD-10-CM

## 2017-01-28 DIAGNOSIS — R7401 Elevation of levels of liver transaminase levels: Secondary | ICD-10-CM

## 2017-01-28 DIAGNOSIS — K8020 Calculus of gallbladder without cholecystitis without obstruction: Secondary | ICD-10-CM | POA: Insufficient documentation

## 2017-01-28 DIAGNOSIS — R112 Nausea with vomiting, unspecified: Secondary | ICD-10-CM | POA: Insufficient documentation

## 2017-01-28 DIAGNOSIS — R072 Precordial pain: Secondary | ICD-10-CM | POA: Diagnosis present

## 2017-01-28 DIAGNOSIS — R740 Nonspecific elevation of levels of transaminase and lactic acid dehydrogenase [LDH]: Secondary | ICD-10-CM | POA: Insufficient documentation

## 2017-01-28 LAB — BASIC METABOLIC PANEL
ANION GAP: 9 (ref 5–15)
BUN: 21 mg/dL — AB (ref 6–20)
CO2: 26 mmol/L (ref 22–32)
Calcium: 9.3 mg/dL (ref 8.9–10.3)
Chloride: 103 mmol/L (ref 101–111)
Creatinine, Ser: 0.85 mg/dL (ref 0.44–1.00)
GFR calc Af Amer: >60 mL/min (ref >60)
GLUCOSE: 102 mg/dL — AB (ref 65–99)
POTASSIUM: 4.2 mmol/L (ref 3.5–5.1)
Sodium: 138 mmol/L (ref 135–145)

## 2017-01-28 LAB — CBC
HEMATOCRIT: 36.5 % (ref 36.0–46.0)
HEMOGLOBIN: 12.3 g/dL (ref 12.0–15.0)
MCH: 32.6 pg (ref 26.0–34.0)
MCHC: 33.7 g/dL (ref 30.0–36.0)
MCV: 96.8 fL (ref 78.0–100.0)
Platelets: 287 10*3/uL (ref 150–400)
RBC: 3.77 MIL/uL — ABNORMAL LOW (ref 3.87–5.11)
RDW: 13.6 % (ref 11.5–15.5)
WBC: 9 10*3/uL (ref 4.0–10.5)

## 2017-01-28 LAB — I-STAT TROPONIN, ED
Troponin i, poc: 0 ng/mL (ref 0.00–0.08)
Troponin i, poc: 0 ng/mL (ref 0.00–0.08)

## 2017-01-28 IMAGING — CR DG CHEST 2V
2 series · 2 of 2 positions shown · non-contrast
Comparison: [DATE]

CLINICAL DATA: Sharp chest pain and vomiting earlier today

EXAM:
CHEST  2 VIEW

[w chest pa]
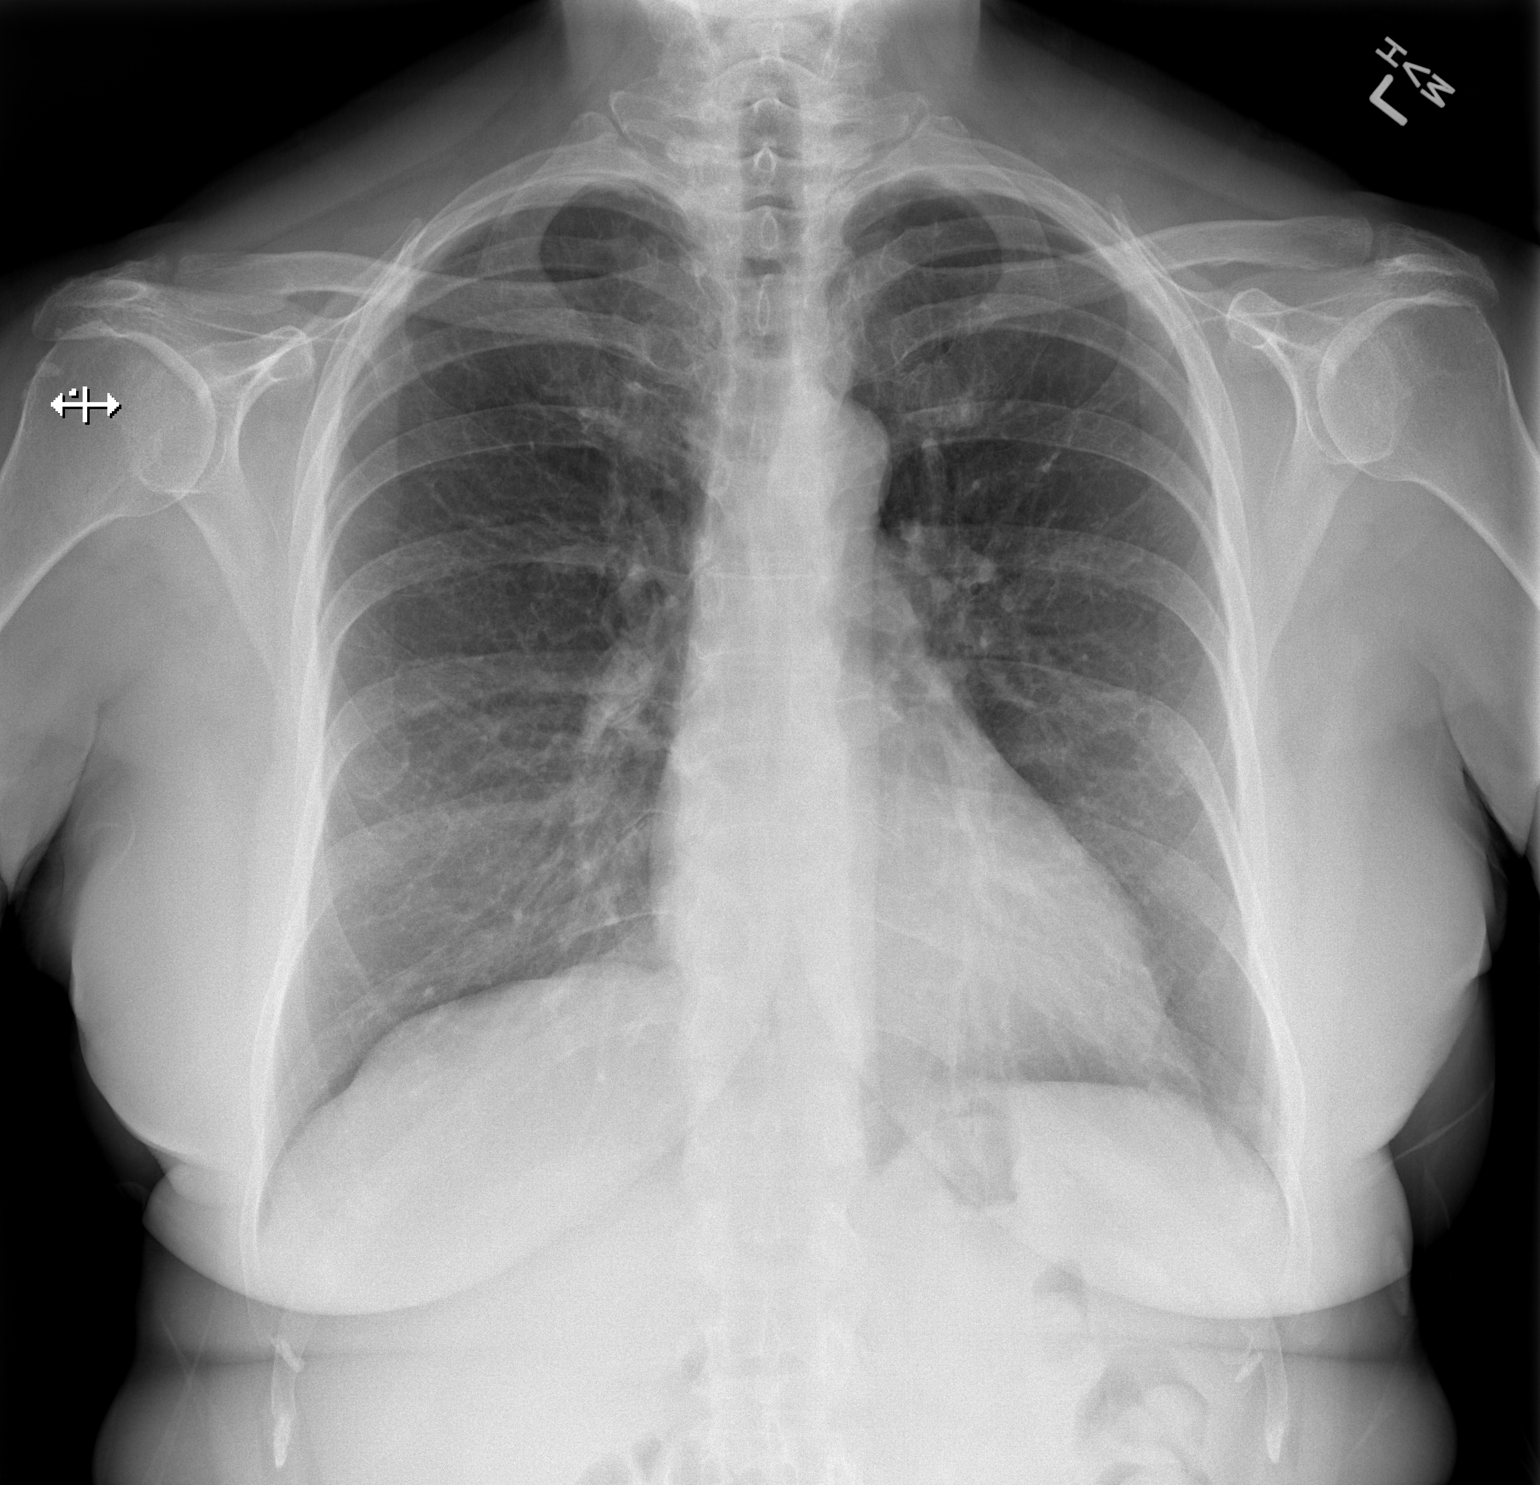

[w chest lat]
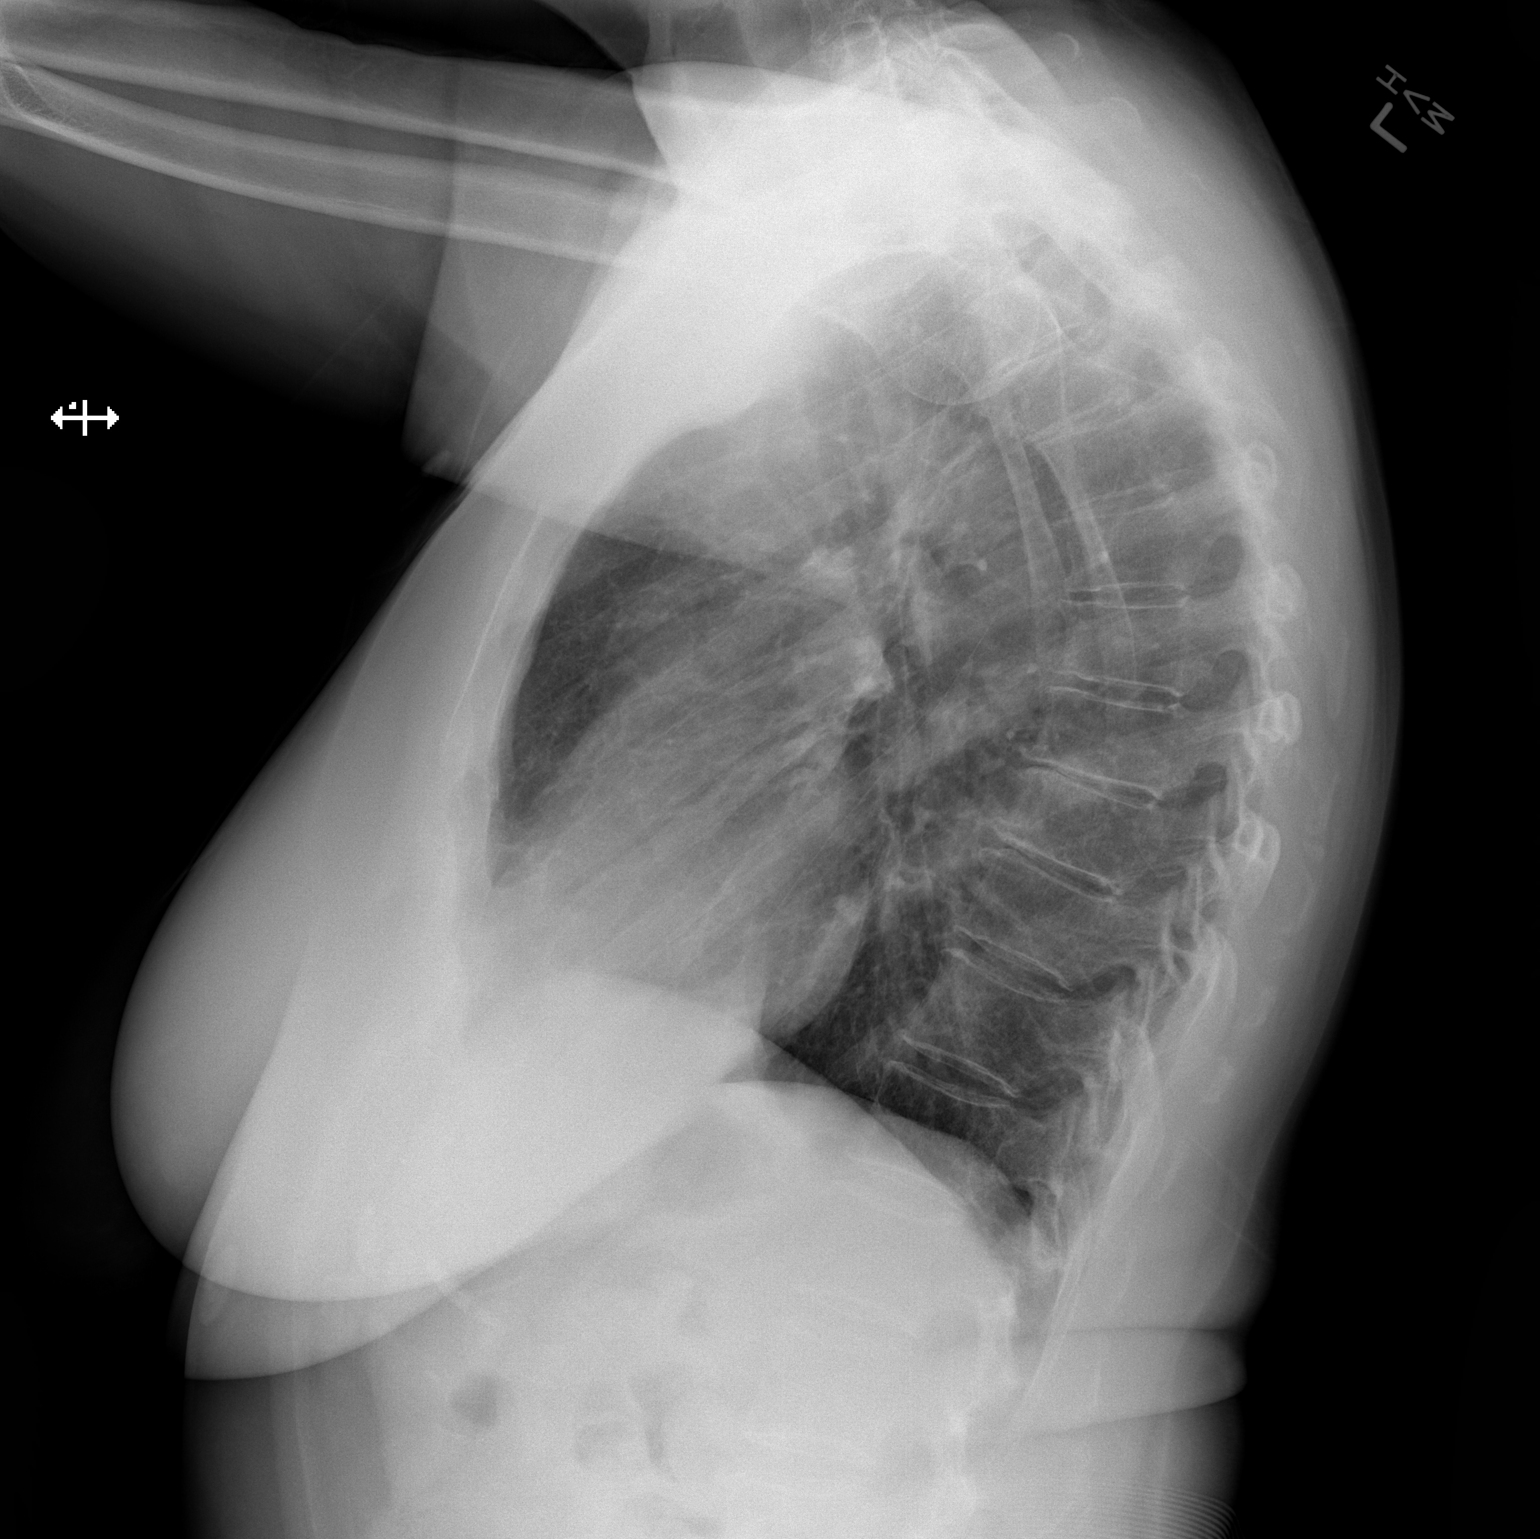

[2 of 2 positions shown; findings below may reference images not displayed]

FINDINGS: Normal heart size, mediastinal contours, and pulmonary vascularity.

Lungs clear.

No pleural effusion or pneumothorax.

Bones unremarkable.
IMPRESSION: Normal exam.

## 2017-01-28 NOTE — ED Provider Notes (Signed)
[facility] DEPT Provider Note   CSN: [id number] Arrival date & time: 01/28/17  1658  By signing my name below, I, Paula [name], attest that this documentation has been prepared under the direction and in the presence of Margarita Mail, PA-C. Electronically Signed: Ruta Hinds., ED Scribe. 01/28/17. 11:09 PM.    History   Chief Complaint Chief Complaint  Patient presents with  . Chest Pain  . Emesis    HPI  Paula [name] is a 66 y.o. female with hx of non-Hodgkin's lymphoma who presents to the Emergency Department complaining of improving, mild chest pain with sudden onset x8 hours. Pt states that she ate lunch at noon. At 3PM she started experiencing a sharp substernal chest pain that caused her to leave work. On the way home from work she reportedly had to pull over and had an episode of vomiting due to the chest pain. She reportedly called a nurse's hotline and was advised to be evaluated at the ED. Pt states that the chest pain has since resolved and that she feels normal. Pt reports hx of chronic shoulder and arm pain. She states that the chest pain was relieved mildly with vomiting. She denies jaw pain, dental pain, arm pain, SOB, numbness, tingling. Pt denies smoking, hx of HTN, hyperlipdemia, family hx of Mi. Of note, pt states that her non- hodgkin's lymphoma has ben asymptomatic for the past 2 years.  The history is provided by the patient. No language interpreter was used.    Past Medical History:  Diagnosis Date  . CARPAL TUNNEL SYNDROME, MILD 12/05/2008  . Chronic cough - seeing pulmonologist in [place], [facility] 03/14/2014  . HSV 03/01/2010  . Leg skin lesion, left 01/30/2015   precancerous cells per patient  . OSTEOPENIA 12/05/2008  . PARESTHESIA 09/01/2009  . Waldenstroms macroglobulinemia - followed by Dr. Carolynn Sayers, [place], [place] 03/15/2014    Patient Active Problem List   Diagnosis Date Noted  . Vaccine contraindicated -  live vaccines contraindicated 04/20/2014  . Waldenstroms macroglobulinemia - followed by Dr. Carolynn Sayers, [place], [place] 03/15/2014  . Chronic cough - seeing pulmonologist in [place], [facility] 03/14/2014  . OSTEOPENIA 12/05/2008    Past Surgical History:  Procedure Laterality Date  . LEG SKIN LESION  BIOPSY / EXCISION Left 01/30/2015   Dr Santiago Glad Gould-precancerous cells per patient  . ORIF TIBIA & FIBULA FRACTURES    . TONSILLECTOMY AND ADENOIDECTOMY      OB History    No data available       Home Medications    Prior to Admission medications   Medication Sig Start Date End Date Taking? Authorizing Provider  albuterol (PROVENTIL HFA;VENTOLIN HFA) 108 (90 Base) MCG/ACT inhaler Inhale 2 puffs into the lungs every 6 (six) hours as needed. Patient not taking: Reported on 05/02/2016 04/11/16   Lucretia Kern, DO  fluticasone [facility]) 50 MCG/ACT nasal spray Place 2 sprays into both nostrils daily. Patient not taking: Reported on 05/02/2016 04/11/16   Lucretia Kern, DO  meclizine (ANTIVERT) 12.5 MG tablet Take 1 tablet (12.5 mg total) by mouth 3 (three) times daily as needed for dizziness. Patient not taking: Reported on 05/02/2016 04/25/16   Lucretia Kern, DO    Family History Family History  Problem Relation Age of Onset  . Hypertension Mother   . Hypertension Brother   . Colon cancer Father 82  . Hypertension Son   . Cancer Neg Hx     lung ca -  mother's side  . Arthritis Neg Hx     osteo - mother's side    Social History Social History  Substance Use Topics  . Smoking status: Never Smoker  . Smokeless tobacco: Never Used  . Alcohol use 3.6 oz/week    6 Glasses of wine per week     Comment: once week      Allergies   Ciprofloxacin hcl   Review of Systems Review of Systems  HENT: Negative for dental problem.   Respiratory: Negative for shortness of breath.   Cardiovascular: Negative for chest pain.  Neurological: Negative for numbness.    A  complete 10 system review of systems was obtained and all systems are negative except as noted in the HPI and PMH.    Physical Exam Updated Vital Signs BP 134/71 (BP Location: Left Arm)   Pulse 62   Temp 97.8 F (36.6 C) (Oral)   Resp 12   Ht 5' 5.5" (1.664 m)   Wt 158 lb (71.7 kg)   SpO2 97%   BMI 25.89 kg/m   Physical Exam  Constitutional: She appears well-developed and well-nourished. No distress.  HENT:  Head: Normocephalic and atraumatic.  Eyes: Conjunctivae are normal.  Neck: Neck supple.  Cardiovascular: Normal rate and regular rhythm.   No murmur heard. Pulmonary/Chest: Effort normal and breath sounds normal. No respiratory distress.  Abdominal: Soft. There is no tenderness.  Musculoskeletal: She exhibits no edema.  Neurological: She is alert.  Skin: Skin is warm and dry.  Psychiatric: She has a normal mood and affect.  Nursing note and vitals reviewed.    ED Treatments / Results   DIAGNOSTIC STUDIES: Oxygen Saturation is 97% on RA, adequate by my interpretation.   COORDINATION OF CARE: 11:11 PM-Discussed next steps with pt. Pt verbalized understanding and is agreeable with the plan.    Labs (all labs ordered are listed, but only abnormal results are displayed) Labs Reviewed  BASIC METABOLIC PANEL - Abnormal; Notable for the following:       Result Value   Glucose, Bld 102 (*)    BUN 21 (*)    All other components within normal limits  CBC - Abnormal; Notable for the following:    RBC 3.77 (*)    All other components within normal limits  I-STAT TROPOININ, ED  Randolm Idol, ED    EKG  EKG Interpretation None       Radiology Dg Chest 2 View  Result Date: 01/28/2017 CLINICAL DATA:  Sharp chest pain and vomiting earlier today EXAM: CHEST  2 VIEW COMPARISON:  10/04/2011 FINDINGS: Normal heart size, mediastinal contours, and pulmonary vascularity. Lungs clear. No pleural effusion or pneumothorax. Bones unremarkable. IMPRESSION: Normal exam.  Electronically Signed   By: Lavonia Dana M.D.   On: 01/28/2017 17:52    Procedures Procedures (including critical care time)  Medications Ordered in ED Medications - No data to display   Initial Impression / Assessment and Plan / ED Course  I have reviewed the triage vital signs and the nursing notes.  Pertinent labs & imaging results that were available during my care of the patient were reviewed by me and considered in my medical decision making (see chart for details).   patient with elevation of her liver enzymes. Her ultrasound is positive for cholelithiasis. She said no repeat abdominal pain, nausea or vomiting here in the emergency department. Elevation in her enzymes may be secondary to biliary colic. I doubt ACS as the cause of her nausea,  vomiting, given her EKG without signs of ischemia, and 2 negative troponins here in the emergency department. She also has a normal risk factors. I have discussed the findings with the patient. I have asked the patient that she follow-up in the next 24-48 hours with her PCP to have her liver enzymes rechecked. Make sure that they're not trending upward. The patient also given referral to [facility] surgery for consultation on an elective cholecystectomy. The patient appears safe for discharge at this time. Discussed return precautions.  Final Clinical Impressions(s) / ED Diagnoses   Final diagnoses:  Epigastric pain  Non-intractable vomiting with nausea, unspecified vomiting type  Calculus of gallbladder without cholecystitis without obstruction  Transaminitis    New Prescriptions New Prescriptions   No medications on file   I personally performed the services described in this documentation, which was scribed in my presence. The recorded information has been reviewed and is accurate.       Margarita Mail, PA-C 01/29/17 [place], MD 01/29/17 2042

## 2017-01-29 ENCOUNTER — Emergency Department (HOSPITAL_COMMUNITY): Payer: BLUE CROSS/BLUE SHIELD

## 2017-01-29 LAB — HEPATIC FUNCTION PANEL
ALBUMIN: 3.9 g/dL (ref 3.5–5.0)
ALT: 167 U/L — ABNORMAL HIGH (ref 14–54)
AST: 179 U/L — AB (ref 15–41)
Alkaline Phosphatase: 72 U/L (ref 38–126)
Bilirubin, Direct: 0.2 mg/dL (ref 0.1–0.5)
Indirect Bilirubin: 0.4 mg/dL (ref 0.3–0.9)
TOTAL PROTEIN: 7.3 g/dL (ref 6.5–8.1)
Total Bilirubin: 0.6 mg/dL (ref 0.3–1.2)

## 2017-01-29 LAB — LIPASE, BLOOD: Lipase: 23 U/L (ref 11–51)

## 2017-01-29 NOTE — Progress Notes (Signed)
  HPI:  ROS: See pertinent positives and negatives per HPI.  Past Medical History:  Diagnosis Date  . CARPAL TUNNEL SYNDROME, MILD 12/05/2008  . Chronic cough - seeing pulmonologist in [place], [facility] 03/14/2014  . HSV 03/01/2010  . Leg skin lesion, left 01/30/2015   precancerous cells per patient  . OSTEOPENIA 12/05/2008  . PARESTHESIA 09/01/2009  . Waldenstroms macroglobulinemia - followed by Dr. Carolynn Sayers, [place], [place] 03/15/2014    Past Surgical History:  Procedure Laterality Date  . LEG SKIN LESION  BIOPSY / EXCISION Left 01/30/2015   Dr Santiago Glad Gould-precancerous cells per patient  . ORIF TIBIA & FIBULA FRACTURES    . TONSILLECTOMY AND ADENOIDECTOMY      Family History  Problem Relation Age of Onset  . Hypertension Mother   . Hypertension Brother   . Colon cancer Father 54  . Hypertension Son   . Cancer Neg Hx     lung ca - mother's side  . Arthritis Neg Hx     osteo - mother's side    Social History   Social History  . Marital status: Married    Spouse name: N/A  . Number of children: N/A  . Years of education: N/A   Social History Main Topics  . Smoking status: Never Smoker  . Smokeless tobacco: Never Used  . Alcohol use 3.6 oz/week    6 Glasses of wine per week     Comment: once week   . Drug use: No  . Sexual activity: Not on file   Other Topics Concern  . 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  albuterol (PROVENTIL HFA;VENTOLIN HFA) 108 (90 Base) MCG/ACT inhaler, Inhale 2 puffs into the lungs every 6 (six) hours as needed. (Patient not taking: Reported on 05/02/2016), Disp: 1 Inhaler, Rfl: 0 .  fluticasone (FLONASE) 50 MCG/ACT nasal spray, Place 2 sprays into both nostrils daily. (Patient not taking: Reported on 05/02/2016), Disp: 16 g, Rfl: 6 .  meclizine (ANTIVERT) 12.5 MG  tablet, Take 1 tablet (12.5 mg total) by mouth 3 (three) times daily as needed for dizziness. (Patient not taking: Reported on 05/02/2016), Disp: 30 tablet, Rfl: 0  EXAM:  There were no vitals filed for this visit.  There is no height or weight on file to calculate BMI.  GENERAL: vitals reviewed and listed above, alert, oriented, appears well hydrated and in no acute distress  HEENT: atraumatic, conjunttiva clear, no obvious abnormalities on inspection of external nose and ears  NECK: no obvious masses on inspection  LUNGS: clear to auscultation bilaterally, no wheezes, rales or rhonchi, good air movement  CV: HRRR, no peripheral edema  MS: moves all extremities without noticeable abnormality  PSYCH: pleasant and cooperative, no obvious depression or anxiety  ASSESSMENT AND PLAN:  Discussed the following assessment and plan:  No diagnosis found.  -Patient advised to return or notify a doctor immediately if symptoms worsen or persist or new concerns arise.  There are no Patient Instructions on file for this visit.  Colin Benton R., DO

## 2017-01-29 NOTE — Progress Notes (Signed)
HPI:  Paula [name] is a pleasant 66 yo with a PMH significant for Waldenstroms macroglobulinemia, here for acute visit for follow up gallstones. She went to the emergency room 01/28/17 for evaluation of epigastric pain and emesis x1 after a meal and was found to have gallstones and mildly elevated LFTs. She was referred to surgery for elective chole and was advised to follow up here today to recheck LFTs. Reports has done ok since without any further NV or severe abd pain. Has had some mild tenderness in the RUQ. Reports hx of some RUQ pain a few times in the past prior to this episode. Also reports occ elevated LFTs on labs with oncologist in the past. She has the number to schedule a consultation with surgery.  ROS: See pertinent positives and negatives per HPI.  Past Medical History:  Diagnosis Date  . CARPAL TUNNEL SYNDROME, MILD 12/05/2008  . Chronic cough - seeing pulmonologist in [place], [facility] 03/14/2014  . HSV 03/01/2010  . Leg skin lesion, left 01/30/2015   precancerous cells per patient  . OSTEOPENIA 12/05/2008  . PARESTHESIA 09/01/2009  . Waldenstroms macroglobulinemia - followed by Dr. Carolynn Sayers, [place], [place] 03/15/2014    Past Surgical History:  Procedure Laterality Date  . LEG SKIN LESION  BIOPSY / EXCISION Left 01/30/2015   Dr Santiago Glad Gould-precancerous cells per patient  . ORIF TIBIA & FIBULA FRACTURES    . TONSILLECTOMY AND ADENOIDECTOMY      Family History  Problem Relation Age of Onset  . Hypertension Mother   . Hypertension Brother   . Colon cancer Father 30  . Hypertension Son   . Cancer Neg Hx     lung ca - mother's side  . Arthritis Neg Hx     osteo - mother's side    Social History   Social History  . Marital status: Married    Spouse name: N/A  . Number of children: N/A  . Years of education: N/A   Social History Main Topics  . Smoking status: Never Smoker  . Smokeless tobacco: Never Used  . Alcohol use 3.6 oz/week   6 Glasses of wine per week     Comment: once week   . Drug use: No  . Sexual activity: Not Asked   Other Topics Concern  . 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  PRESCRIPTION MEDICATION, Face wash given by dermatologist (cannot recall name), Disp: , Rfl:   EXAM:  Vitals:   01/30/17 1423  BP: 100/70  Pulse: 73  Temp: 97.4 F (36.3 C)    Body mass index is 26.2 kg/m.  GENERAL: vitals reviewed and listed above, alert, oriented, appears well hydrated and in no acute distress  HEENT: atraumatic, conjunttiva clear, no obvious abnormalities on inspection of external nose and ears  NECK: no obvious masses on inspection  LUNGS: clear to auscultation bilaterally, no wheezes, rales or rhonchi, good air movement  CV: HRRR, no peripheral edema  ABD: BS+, soft, Mildly tender RUQ  MS: moves all extremities without noticeable abnormality  PSYCH: pleasant and cooperative, no obvious depression or anxiety  ASSESSMENT AND PLAN:  Discussed the following assessment and plan:  Biliary colic  Elevated liver enzymes - Plan: Hepatic  Function Panel  Gallstones  -repeat LFTs -surgery consultation -healthy diet -Patient advised to return or notify a doctor immediately if symptoms worsen or persist or new concerns arise.  Patient Instructions  BEFORE YOU LEAVE: Labs  Please schedule an appointment with the surgeon.   Biliary Colic, Adult Biliary colic is severe pain caused by a problem with a small organ in the upper right part of your belly (gallbladder). The gallbladder stores a digestive fluid produced in the liver (bile) that helps the body break down fat. Bile and other digestive enzymes are carried from the liver to the small intestine though tube-like structures (bile ducts). The gallbladder and the bile ducts form the biliary  tract. Sometimes hard deposits of digestive fluids form in the gallbladder (gallstones) and block the flow of bile from the gallbladder, causing biliary colic. This condition is also called a gallbladder attack. Gallstones can be as small as a grain of sand or as big as a golf ball. There could be just one gallstone in the gallbladder, or there could be many. What are the causes? Biliary colic is usually caused by gallstones. Less often, a tumor could block the flow of bile from the gallbladder and trigger biliary colic. What increases the risk? This condition is more likely to develop in:  Women.  People of Hispanic descent.  People with a family history of gallstones.  People who are obese.  People who suddenly or quickly lose weight.  People who eat a high-calorie, low-fiber diet that is rich in refined carbs (carbohydrates), such as white bread and white rice.  People who have an intestinal disease that affects nutrient absorption, such as Crohn disease.  People who have a metabolic condition, such as metabolic syndrome or diabetes. What are the signs or symptoms? Severe pain in the upper right side of the belly is the main symptom of biliary colic. You may feel this pain below the chest but above the hip. This pain often occurs at night or after eating a very fatty meal. This pain may get worse for up to an hour and last as long as 12 hours. In most cases, the pain fades (subsides) within a couple hours. Other symptoms of this condition include:  Nausea and vomiting.  Pain under the right shoulder. How is this diagnosed? This condition is diagnosed based on your medical history, your symptoms, and a physical exam. You may have tests, including:  Blood tests to rule out infection or inflammation of the bile ducts, gallbladder, pancreas, or liver.  Imaging studies such as:  Ultrasound.  CT scan.  MRI. In some cases, you may need to have an imaging study done using a small  amount of radioactive material (nuclear medicine) to confirm the diagnosis. How is this treated? Treatment for this condition may include medicine to relieve your pain or nausea. If you have gallstones that are causing biliary colic, you may need surgery to remove the gallbladder (cholecystectomy). It may take months or years before the gallstones are completely gone. Follow these instructions at home:  Take over-the-counter and prescription medicines only as told by your health care provider.  Drink enough fluid to keep your urine clear or pale yellow.  Follow instructions from your health care provider about eating or drinking restrictions. These may include avoiding:  Fatty, greasy, and fried foods.  Any foods that make the pain worse.  Overeating.  Having a large meal after not eating for a while.  Keep all follow-up visits as told  by your health care provider. This is important. How is this prevented? Steps to prevent this condition include:  Maintaining a healthy body weight.  Getting regular exercise.  Eating a healthy, high-fiber, low-fat diet.  Limiting how much sugar and refined carbs you eat, such as sweets, white flour, and white rice. Contact a health care provider if:  Your pain lasts more than 5 hours.  You vomit.  You have a fever and chills.  Your pain gets worse. Get help right away if:  Your skin or the whites of your eyes look yellow (jaundice).  Your have tea-colored urine and light-colored stools.  You are dizzy or you faint. This information is not intended to replace advice given to you by your health care provider. Make sure you discuss any questions you have with your health care provider. Document Released: 05/19/2006 Document Revised: 08/13/2016 Document Reviewed: 07/01/2016 Elsevier Interactive Patient Education  2017 [place]., DO

## 2017-01-30 ENCOUNTER — Ambulatory Visit (INDEPENDENT_AMBULATORY_CARE_PROVIDER_SITE_OTHER): Payer: BLUE CROSS/BLUE SHIELD | Admitting: Family Medicine

## 2017-01-30 ENCOUNTER — Encounter: Payer: Self-pay | Admitting: Family Medicine

## 2017-01-30 VITALS — BP 100/70 | HR 73 | Temp 97.4°F | Ht 65.5 in | Wt 159.9 lb

## 2017-01-30 DIAGNOSIS — R748 Abnormal levels of other serum enzymes: Secondary | ICD-10-CM | POA: Diagnosis not present

## 2017-01-30 DIAGNOSIS — K8020 Calculus of gallbladder without cholecystitis without obstruction: Secondary | ICD-10-CM | POA: Diagnosis not present

## 2017-01-30 DIAGNOSIS — K8050 Calculus of bile duct without cholangitis or cholecystitis without obstruction: Secondary | ICD-10-CM | POA: Diagnosis not present

## 2017-01-30 LAB — HEPATIC FUNCTION PANEL
ALBUMIN: 4.3 g/dL (ref 3.5–5.2)
ALT: 108 U/L — ABNORMAL HIGH (ref 0–35)
AST: 56 U/L — AB (ref 0–37)
Alkaline Phosphatase: 69 U/L (ref 39–117)
BILIRUBIN TOTAL: 0.6 mg/dL (ref 0.2–1.2)
Bilirubin, Direct: 0.2 mg/dL (ref 0.0–0.3)
Total Protein: 7.3 g/dL (ref 6.0–8.3)

## 2017-01-30 NOTE — Patient Instructions (Addendum)
BEFORE YOU LEAVE: Labs  Please schedule an appointment with the surgeon.   Biliary Colic, Adult Biliary colic is severe pain caused by a problem with a small organ in the upper right part of your belly (gallbladder). The gallbladder stores a digestive fluid produced in the liver (bile) that helps the body break down fat. Bile and other digestive enzymes are carried from the liver to the small intestine though tube-like structures (bile ducts). The gallbladder and the bile ducts form the biliary tract. Sometimes hard deposits of digestive fluids form in the gallbladder (gallstones) and block the flow of bile from the gallbladder, causing biliary colic. This condition is also called a gallbladder attack. Gallstones can be as small as a grain of sand or as big as a golf ball. There could be just one gallstone in the gallbladder, or there could be many. What are the causes? Biliary colic is usually caused by gallstones. Less often, a tumor could block the flow of bile from the gallbladder and trigger biliary colic. What increases the risk? This condition is more likely to develop in:  Women.  People of Hispanic descent.  People with a family history of gallstones.  People who are obese.  People who suddenly or quickly lose weight.  People who eat a high-calorie, low-fiber diet that is rich in refined carbs (carbohydrates), such as white bread and white rice.  People who have an intestinal disease that affects nutrient absorption, such as Crohn disease.  People who have a metabolic condition, such as metabolic syndrome or diabetes. What are the signs or symptoms? Severe pain in the upper right side of the belly is the main symptom of biliary colic. You may feel this pain below the chest but above the hip. This pain often occurs at night or after eating a very fatty meal. This pain may get worse for up to an hour and last as long as 12 hours. In most cases, the pain fades (subsides) within a  couple hours. Other symptoms of this condition include:  Nausea and vomiting.  Pain under the right shoulder. How is this diagnosed? This condition is diagnosed based on your medical history, your symptoms, and a physical exam. You may have tests, including:  Blood tests to rule out infection or inflammation of the bile ducts, gallbladder, pancreas, or liver.  Imaging studies such as:  Ultrasound.  CT scan.  MRI. In some cases, you may need to have an imaging study done using a small amount of radioactive material (nuclear medicine) to confirm the diagnosis. How is this treated? Treatment for this condition may include medicine to relieve your pain or nausea. If you have gallstones that are causing biliary colic, you may need surgery to remove the gallbladder (cholecystectomy). It may take months or years before the gallstones are completely gone. Follow these instructions at home:  Take over-the-counter and prescription medicines only as told by your health care provider.  Drink enough fluid to keep your urine clear or pale yellow.  Follow instructions from your health care provider about eating or drinking restrictions. These may include avoiding:  Fatty, greasy, and fried foods.  Any foods that make the pain worse.  Overeating.  Having a large meal after not eating for a while.  Keep all follow-up visits as told by your health care provider. This is important. How is this prevented? Steps to prevent this condition include:  Maintaining a healthy body weight.  Getting regular exercise.  Eating a healthy, high-fiber, low-fat  diet.  Limiting how much sugar and refined carbs you eat, such as sweets, white flour, and white rice. Contact a health care provider if:  Your pain lasts more than 5 hours.  You vomit.  You have a fever and chills.  Your pain gets worse. Get help right away if:  Your skin or the whites of your eyes look yellow (jaundice).  Your have  tea-colored urine and light-colored stools.  You are dizzy or you faint. This information is not intended to replace advice given to you by your health care provider. Make sure you discuss any questions you have with your health care provider. Document Released: 05/19/2006 Document Revised: 08/13/2016 Document Reviewed: 07/01/2016 Elsevier Interactive Patient Education  2017 [employer].

## 2017-01-30 NOTE — Progress Notes (Signed)
Pre visit review using our clinic review tool, if applicable. No additional management support is needed unless otherwise documented below in the visit note. 

## 2017-02-27 HISTORY — PX: CHOLECYSTECTOMY: SHX55

## 2017-03-04 ENCOUNTER — Other Ambulatory Visit: Payer: Self-pay | Admitting: General Surgery

## 2017-09-15 ENCOUNTER — Telehealth: Payer: Self-pay | Admitting: Gastroenterology

## 2017-09-15 NOTE — Telephone Encounter (Signed)
Routed to Dr. Loletha Carrow, DOD.

## 2017-09-16 ENCOUNTER — Other Ambulatory Visit: Payer: Self-pay

## 2017-09-16 DIAGNOSIS — H16143 Punctate keratitis, bilateral: Secondary | ICD-10-CM | POA: Diagnosis not present

## 2017-09-16 DIAGNOSIS — H2513 Age-related nuclear cataract, bilateral: Secondary | ICD-10-CM | POA: Diagnosis not present

## 2017-09-16 DIAGNOSIS — Z1211 Encounter for screening for malignant neoplasm of colon: Secondary | ICD-10-CM

## 2017-09-16 DIAGNOSIS — H539 Unspecified visual disturbance: Secondary | ICD-10-CM | POA: Diagnosis not present

## 2017-09-16 DIAGNOSIS — H43393 Other vitreous opacities, bilateral: Secondary | ICD-10-CM | POA: Diagnosis not present

## 2017-09-16 NOTE — Telephone Encounter (Signed)
Thanks very much for checking.  I am glad the patient thought to call us. I reviewed her last colonoscopy report from 06/2012, no polyps seen.  With a parent who had colon cancer, Paula [name] is in fact due for a colonoscopy this year (5 yr interval from the last).  If she is ready to proceed, please set her up with an [place] nurse appointment to get it on my schedule.

## 2017-09-16 NOTE — Telephone Encounter (Signed)
Spoke to patient, she is now scheduled for pre-visit and colonoscopy with Dr. Loletha Carrow due to family history of colon cancer (father). I have placed Amb referral now so that our staff has proper time to verify with insurance, as patient was anxious about getting approval for this.

## 2017-09-16 NOTE — Telephone Encounter (Signed)
Left message for patient to call back  

## 2017-09-17 NOTE — Telephone Encounter (Signed)
Patient returned my call from yesterday.  Informed her that her insurance will cover procedure 323% and precert is not required.

## 2017-09-23 DIAGNOSIS — C880 Waldenstrom macroglobulinemia: Secondary | ICD-10-CM | POA: Diagnosis not present

## 2017-09-24 ENCOUNTER — Ambulatory Visit (AMBULATORY_SURGERY_CENTER): Payer: Self-pay | Admitting: *Deleted

## 2017-09-24 VITALS — Ht 65.5 in | Wt 164.8 lb

## 2017-09-24 DIAGNOSIS — Z86010 Personal history of colonic polyps: Secondary | ICD-10-CM

## 2017-09-24 DIAGNOSIS — Z800 Family history of malignant neoplasm of digestive organs: Secondary | ICD-10-CM

## 2017-09-24 MED ORDER — NA SULFATE-K SULFATE-MG SULF 17.5-3.13-1.6 GM/177ML PO SOLN: ORAL | 0 refills | Status: DC

## 2017-09-24 NOTE — Progress Notes (Signed)
No allergies to eggs or soy. No problems with anesthesia.  Pt given Emmi instructions for colonoscopy  No oxygen use  No diet drug use  

## 2017-09-29 ENCOUNTER — Encounter: Payer: Self-pay | Admitting: Gastroenterology

## 2017-09-29 ENCOUNTER — Ambulatory Visit (AMBULATORY_SURGERY_CENTER): Payer: BLUE CROSS/BLUE SHIELD | Admitting: Gastroenterology

## 2017-09-29 VITALS — BP 120/52 | HR 61 | Temp 98.0°F | Resp 14 | Ht 65.0 in | Wt 164.0 lb

## 2017-09-29 DIAGNOSIS — Z800 Family history of malignant neoplasm of digestive organs: Secondary | ICD-10-CM

## 2017-09-29 DIAGNOSIS — Z1211 Encounter for screening for malignant neoplasm of colon: Secondary | ICD-10-CM | POA: Diagnosis not present

## 2017-09-29 DIAGNOSIS — Z1212 Encounter for screening for malignant neoplasm of rectum: Secondary | ICD-10-CM | POA: Diagnosis not present

## 2017-09-29 MED ORDER — SODIUM CHLORIDE 0.9 % IV SOLN
500.0000 mL | INTRAVENOUS | Status: DC
Start: 2017-09-29 — End: 2017-09-29

## 2017-09-29 NOTE — Patient Instructions (Signed)
Discharge instructions given. Normal exam. Resume previous medications. YOU HAD AN ENDOSCOPIC PROCEDURE TODAY AT THE [facility]:   Refer to the procedure report that was given to you for any specific questions about what was found during the examination.  If the procedure report does not answer your questions, please call your gastroenterologist to clarify.  If you requested that your care partner not be given the details of your procedure findings, then the procedure report has been included in a sealed envelope for you to review at your convenience later.  YOU SHOULD EXPECT: Some feelings of bloating in the abdomen. Passage of more gas than usual.  Walking can help get rid of the air that was put into your GI tract during the procedure and reduce the bloating. If you had a lower endoscopy (such as a colonoscopy or flexible sigmoidoscopy) you may notice spotting of blood in your stool or on the toilet paper. If you underwent a bowel prep for your procedure, you may not have a normal bowel movement for a few days.  Please Note:  You might notice some irritation and congestion in your nose or some drainage.  This is from the oxygen used during your procedure.  There is no need for concern and it should clear up in a day or so.  SYMPTOMS TO REPORT IMMEDIATELY:   Following lower endoscopy (colonoscopy or flexible sigmoidoscopy):  Excessive amounts of blood in the stool  Significant tenderness or worsening of abdominal pains  Swelling of the abdomen that is new, acute  Fever of 100F or higher   For urgent or emergent issues, a gastroenterologist can be reached at any hour by calling [phone].   DIET:  We do recommend a small meal at first, but then you may proceed to your regular diet.  Drink plenty of fluids but you should avoid alcoholic beverages for 24 hours.  ACTIVITY:  You should plan to take it easy for the rest of today and you should NOT DRIVE or use heavy machinery  until tomorrow (because of the sedation medicines used during the test).    FOLLOW UP: Our staff will call the number listed on your records the next business day following your procedure to check on you and address any questions or concerns that you may have regarding the information given to you following your procedure. If we do not reach you, we will leave a message.  However, if you are feeling well and you are not experiencing any problems, there is no need to return our call.  We will assume that you have returned to your regular daily activities without incident.  If any biopsies were taken you will be contacted by phone or by letter within the next 1-3 weeks.  Please call us at [phone] if you have not heard about the biopsies in 3 weeks.    SIGNATURES/CONFIDENTIALITY: You and/or your care partner have signed paperwork which will be entered into your electronic medical record.  These signatures attest to the fact that that the information above on your After Visit Summary has been reviewed and is understood.  Full responsibility of the confidentiality of this discharge information lies with you and/or your care-partner. 

## 2017-09-29 NOTE — Op Note (Addendum)
[place] Patient Name: Paula [name] Procedure Date: 09/29/2017 10:54 AM MRN: [id number] Endoscopist: Mallie Mussel L. Loletha Carrow , MD Age: 66 Referring MD:  Date of Birth: 1951/11/30 Gender: Female Account #: [id number] Procedure:                Colonoscopy Indications:              Screening in patient at increased risk: Colorectal                            cancer in father 51 or older (Dx approximately age                            55)                           This patient had a 57mm tubular adenoma in 2007 and                            no polyps on colonoscopy 06/2012 Medicines:                Monitored Anesthesia Care Procedure:                Pre-Anesthesia Assessment:                           - Prior to the procedure, a History and Physical                            was performed, and patient medications and                            allergies were reviewed. The patient's tolerance of                            previous anesthesia was also reviewed. The risks                            and benefits of the procedure and the sedation                            options and risks were discussed with the patient.                            All questions were answered, and informed consent                            was obtained. Prior Anticoagulants: The patient has                            taken no previous anticoagulant or antiplatelet                            agents. ASA Grade Assessment: I - A normal, healthy  patient. After reviewing the risks and benefits,                            the patient was deemed in satisfactory condition to                            undergo the procedure.                           After obtaining informed consent, the colonoscope                            was passed under direct vision. Throughout the                            procedure, the patient's blood pressure, pulse, and                            oxygen  saturations were monitored continuously. The                            Model PCF-H190DL [id number]) scope was introduced                            through the anus and advanced to the the cecum,                            identified by appendiceal orifice and ileocecal                            valve. The colonoscopy was performed without                            difficulty. The patient tolerated the procedure                            well. The quality of the bowel preparation was                            excellent. The ileocecal valve, appendiceal                            orifice, and rectum were photographed. The quality                            of the bowel preparation was evaluated using the                            BBPS [facility] Bowel Preparation Scale) with scores                            of: Right Colon = 3, Transverse Colon = 3 and Left  Colon = 3 (entire mucosa seen well with no residual                            staining, small fragments of stool or opaque                            liquid). The total BBPS score equals 9. The bowel                            preparation used was SUPREP. Scope In: 10:55:31 AM Scope Out: 11:08:14 AM Scope Withdrawal Time: 0 hours 9 minutes 8 seconds  Total Procedure Duration: 0 hours 12 minutes 43 seconds  Findings:                 The perianal and digital rectal examinations were                            normal.                           The entire examined colon appeared normal on direct                            and retroflexion views. Complications:            No immediate complications. Estimated Blood Loss:     Estimated blood loss: none. Impression:               - The entire examined colon is normal on direct and                            retroflexion views.                           - No specimens collected. Recommendation:           - Patient has a contact number available for                             emergencies. The signs and symptoms of potential                            delayed complications were discussed with the                            patient. Return to normal activities tomorrow.                            Written discharge instructions were provided to the                            patient.                           - Resume previous diet.                           -  Continue present medications.                           - Repeat colonoscopy in 10 years for screening                            purposes, based on this patient's personal and                            family history and current guidelines. Aoi Kouns L. Loletha Carrow, MD 09/29/2017 11:15:05 AM This report has been signed electronically.

## 2017-09-29 NOTE — Progress Notes (Signed)
Report given to PACU, vss 

## 2017-09-29 NOTE — Progress Notes (Signed)
Pt's states no medical or surgical changes since previsit or office visit. 

## 2017-09-30 ENCOUNTER — Telehealth: Payer: Self-pay | Admitting: *Deleted

## 2017-09-30 NOTE — Telephone Encounter (Signed)
  Follow up Call-  Call back number 09/29/2017  Post procedure Call Back phone  # [phone]  Permission to leave phone message Yes  Some recent data might be hidden     Patient questions:  Do you have a fever, pain , or abdominal swelling? No. Pain Score  0 *  Have you tolerated food without any problems? Yes.    Have you been able to return to your normal activities? Yes.    Do you have any questions about your discharge instructions: Diet   No. Medications  No. Follow up visit  No.  Do you have questions or concerns about your Care? No.  Actions: * If pain score is 4 or above: No action needed, pain <4.

## 2017-12-01 DIAGNOSIS — Z01419 Encounter for gynecological examination (general) (routine) without abnormal findings: Secondary | ICD-10-CM | POA: Diagnosis not present

## 2017-12-01 DIAGNOSIS — Z6827 Body mass index (BMI) 27.0-27.9, adult: Secondary | ICD-10-CM | POA: Diagnosis not present

## 2017-12-01 DIAGNOSIS — Z1231 Encounter for screening mammogram for malignant neoplasm of breast: Secondary | ICD-10-CM | POA: Diagnosis not present

## 2017-12-04 ENCOUNTER — Other Ambulatory Visit: Payer: Self-pay | Admitting: Obstetrics and Gynecology

## 2017-12-04 DIAGNOSIS — R928 Other abnormal and inconclusive findings on diagnostic imaging of breast: Secondary | ICD-10-CM

## 2017-12-08 ENCOUNTER — Other Ambulatory Visit: Payer: BLUE CROSS/BLUE SHIELD

## 2017-12-09 ENCOUNTER — Other Ambulatory Visit: Payer: BLUE CROSS/BLUE SHIELD

## 2017-12-09 ENCOUNTER — Other Ambulatory Visit: Payer: Self-pay | Admitting: Obstetrics and Gynecology

## 2017-12-09 ENCOUNTER — Ambulatory Visit
Admission: RE | Admit: 2017-12-09 | Discharge: 2017-12-09 | Disposition: A | Discharge status: Home / Self Care | Payer: BLUE CROSS/BLUE SHIELD | Source: Ambulatory Visit | Attending: Obstetrics and Gynecology | Admitting: Obstetrics and Gynecology

## 2017-12-09 DIAGNOSIS — N6311 Unspecified lump in the right breast, upper outer quadrant: Secondary | ICD-10-CM | POA: Diagnosis not present

## 2017-12-09 DIAGNOSIS — R928 Other abnormal and inconclusive findings on diagnostic imaging of breast: Secondary | ICD-10-CM

## 2017-12-09 DIAGNOSIS — R922 Inconclusive mammogram: Secondary | ICD-10-CM | POA: Diagnosis not present

## 2017-12-09 DIAGNOSIS — N6313 Unspecified lump in the right breast, lower outer quadrant: Secondary | ICD-10-CM | POA: Diagnosis not present

## 2017-12-09 DIAGNOSIS — N6489 Other specified disorders of breast: Secondary | ICD-10-CM

## 2017-12-09 IMAGING — MG 2D DIGITAL DIAGNOSTIC UNILATERAL RIGHT MAMMOGRAM WITH CAD AND AD
8 of 15 series · 8 of 35 positions shown · non-contrast
Comparison: Previous exam(s).

CLINICAL DATA: Patient recalled from screening for right breast
asymmetry.

EXAM:
2D DIGITAL DIAGNOSTIC RIGHT MAMMOGRAM WITH CAD AND ADJUNCT TOMO
ULTRASOUND RIGHT BREAST

[R MLO synth-2D]
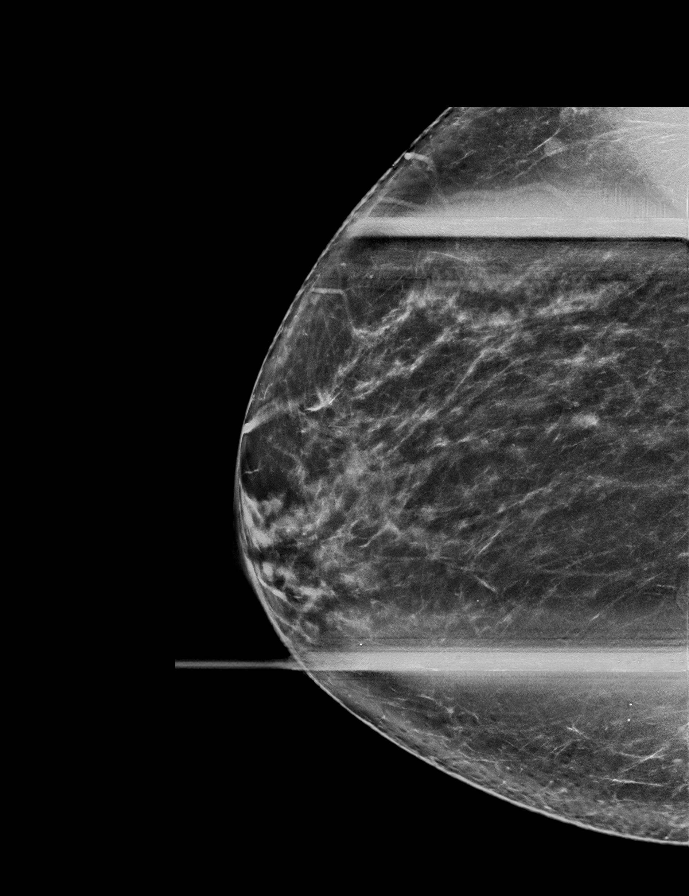

[R ML synth-2D]
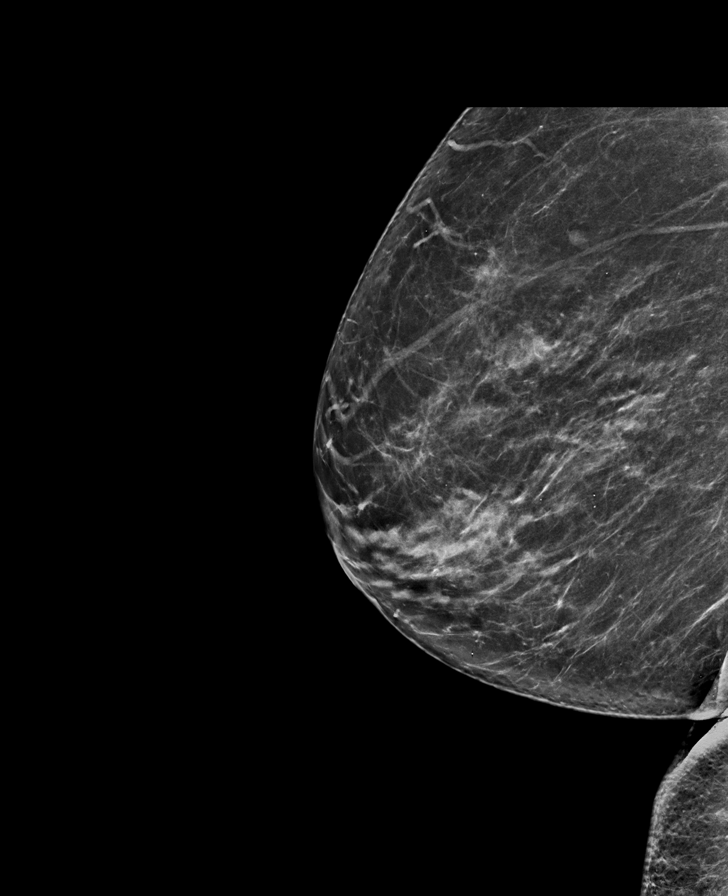

[R CC synth-2D (1 of 2)]
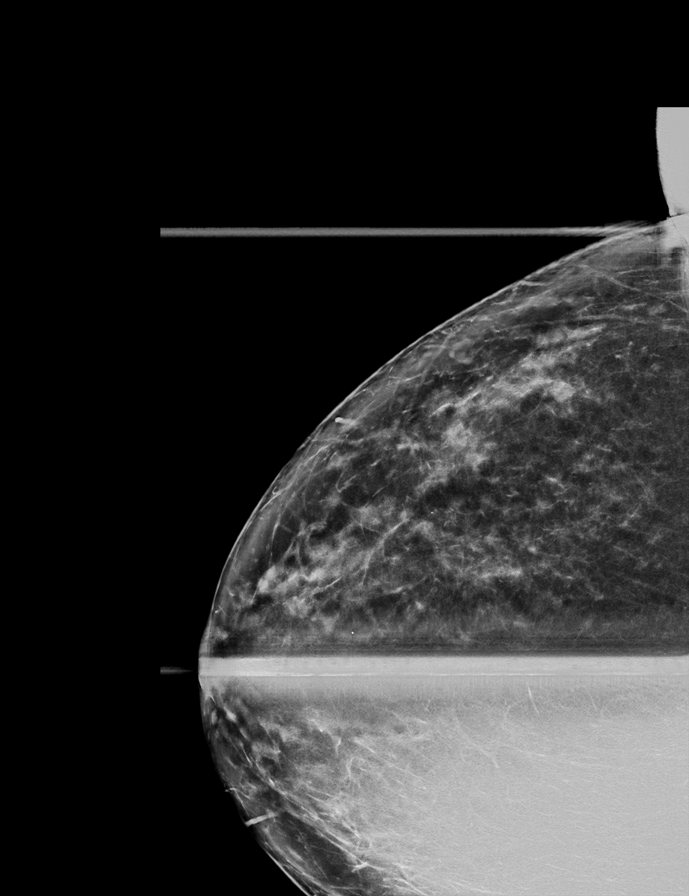

[R XCCL synth-2D]
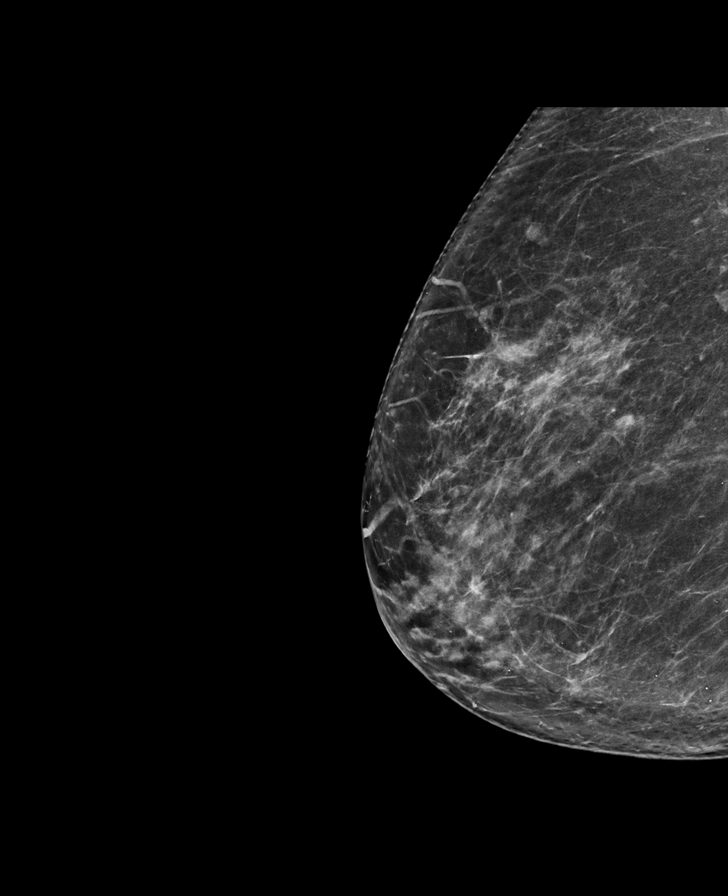

[R CC synth-2D (2 of 2)]
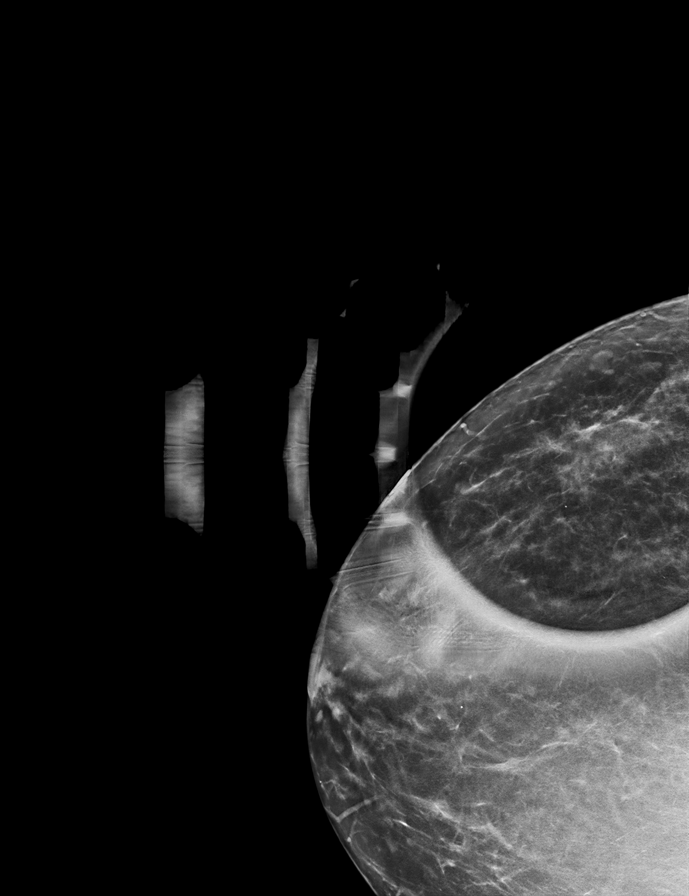

[R CC (1 of 2)]
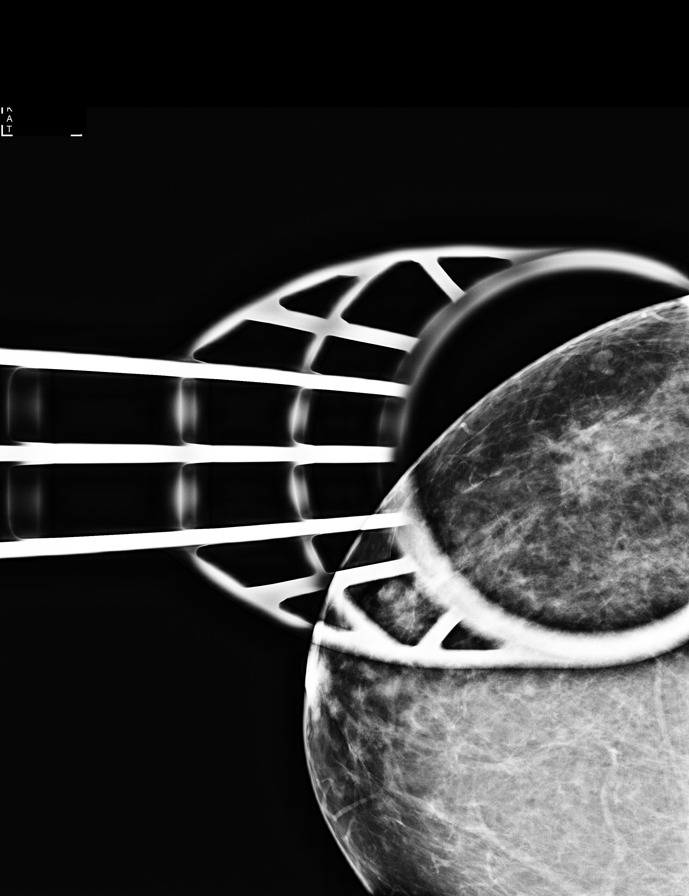

[R CC (2 of 2)]
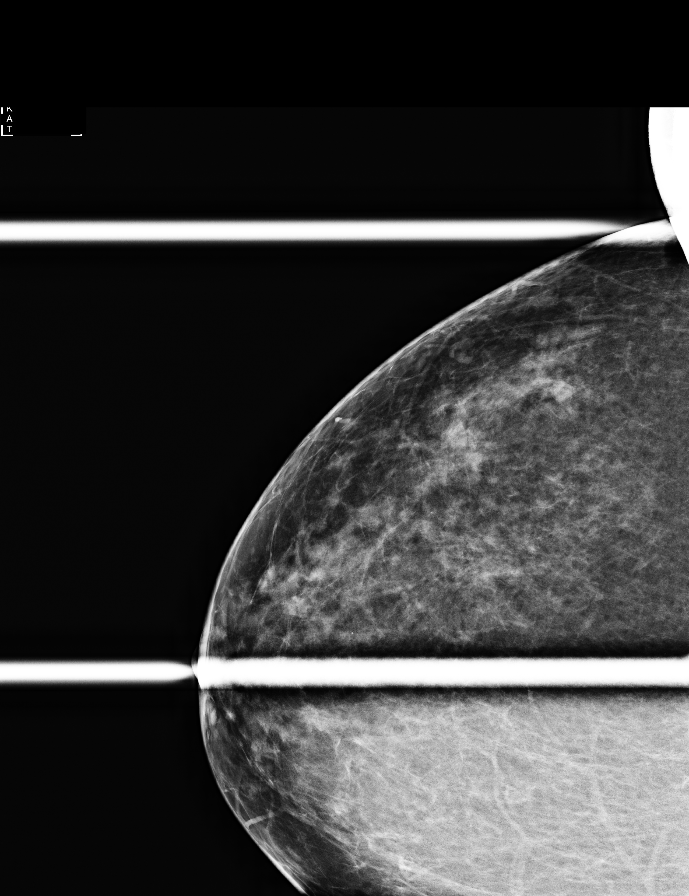

[R ML]
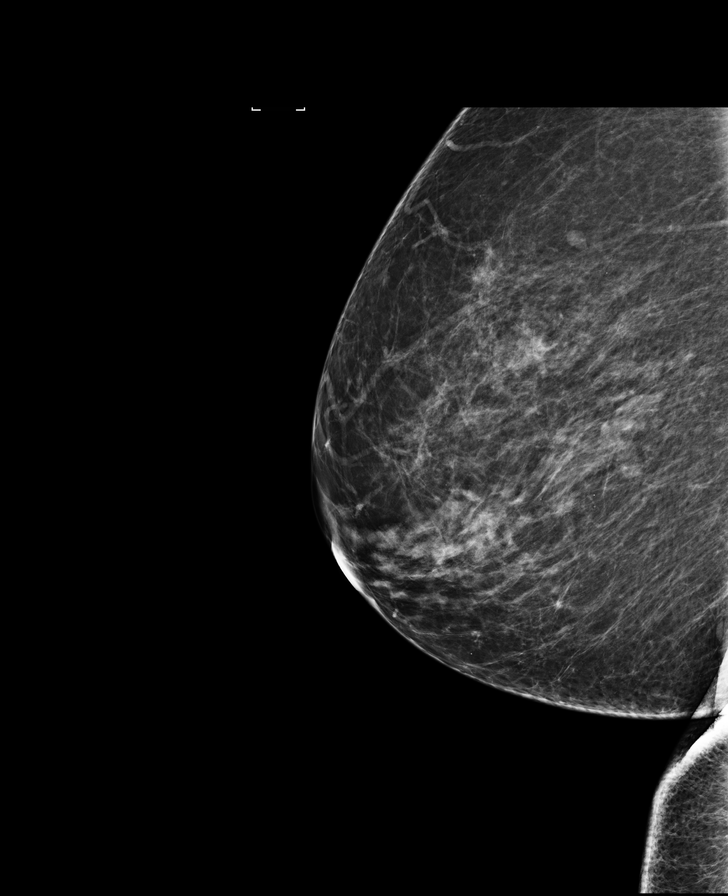

[8 of 35 positions shown; findings below may reference images not displayed]

ACR Breast Density Category c: The breast tissue is heterogeneously
dense, which may obscure small masses.
FINDINGS: Within the lateral aspect of the right breast there is a persistent
lobular density, further evaluated with spot compression and full
paddle tomosynthesis images.

Mammographic images were processed with CAD.

On physical exam, I palpate no discrete mass within the lateral
right breast.

Targeted ultrasound is performed, showing a 4.2 x 2.7 x 0.7 cm solid
and cystic mass right breast 9 o'clock position 7 cm from nipple,
corresponding with mammographic abnormality. No right axillary
adenopathy.
IMPRESSION: Indeterminate solid and cystic right breast mass.

RECOMMENDATION:
Ultrasound-guided core needle biopsy solid and cystic right breast
mass 9 o'clock position.

I have discussed the findings and recommendations with the patient.
Results were also provided in writing at the conclusion of the
visit. If applicable, a reminder letter will be sent to the patient
regarding the next appointment.

BI-RADS CATEGORY  4: Suspicious.

## 2017-12-09 IMAGING — US ULTRASOUND RIGHT BREAST LIMITED
1 series · 12 of 12 positions shown · non-contrast
Comparison: Previous exam(s).

CLINICAL DATA: Patient recalled from screening for right breast
asymmetry.

EXAM:
2D DIGITAL DIAGNOSTIC RIGHT MAMMOGRAM WITH CAD AND ADJUNCT TOMO
ULTRASOUND RIGHT BREAST

[Series 1: ultrasound right breast limited · 0.06mm/px · 12 of 12 slices shown]
[im 1/12]
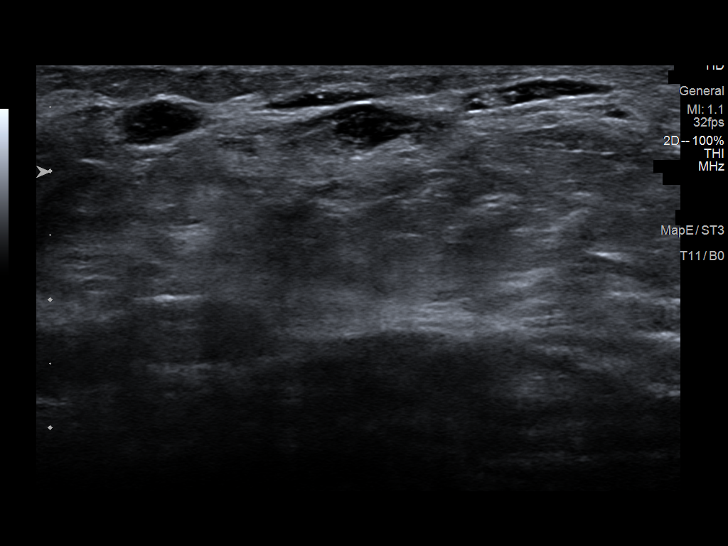
[im 2/12]
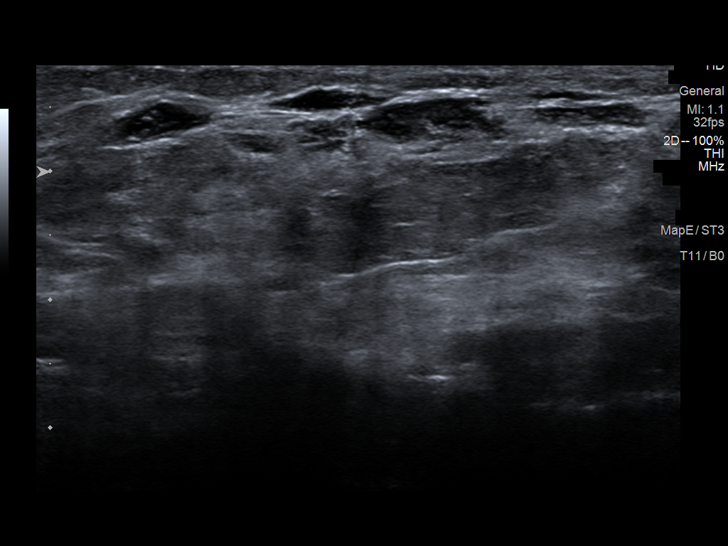
[im 3/12]
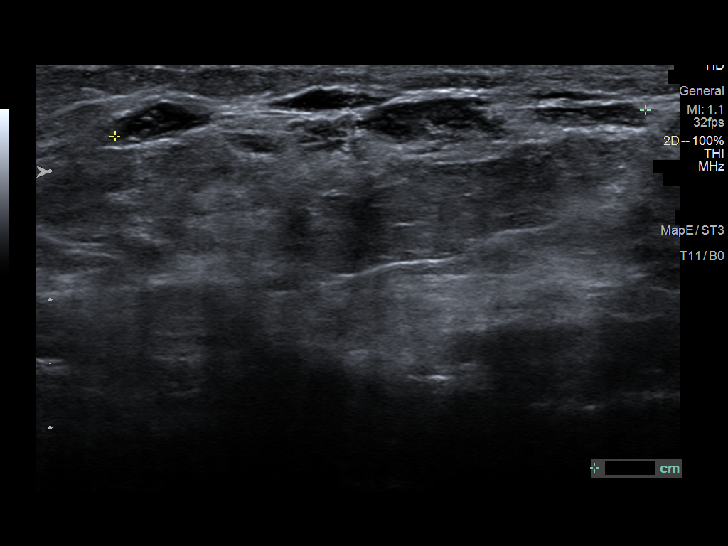
[im 4/12]
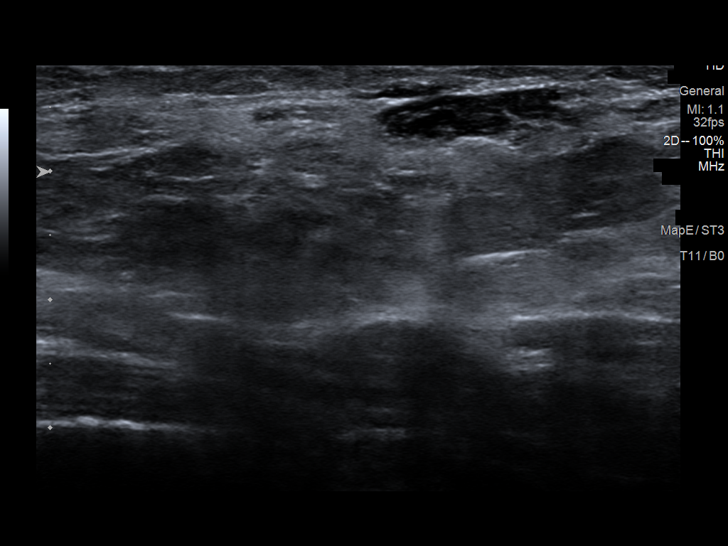
[im 5/12]
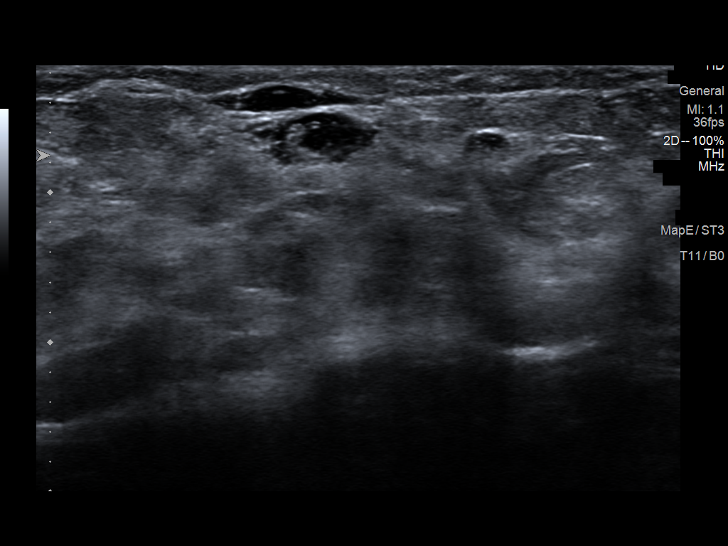
[im 6/12]
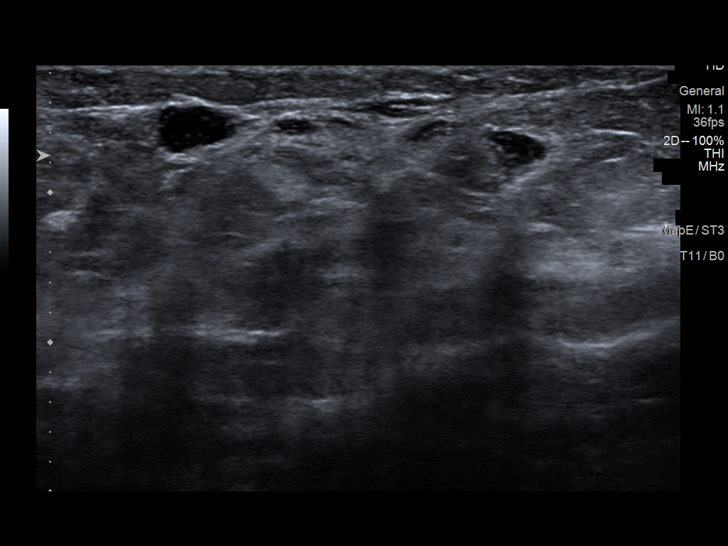
[im 7/12]
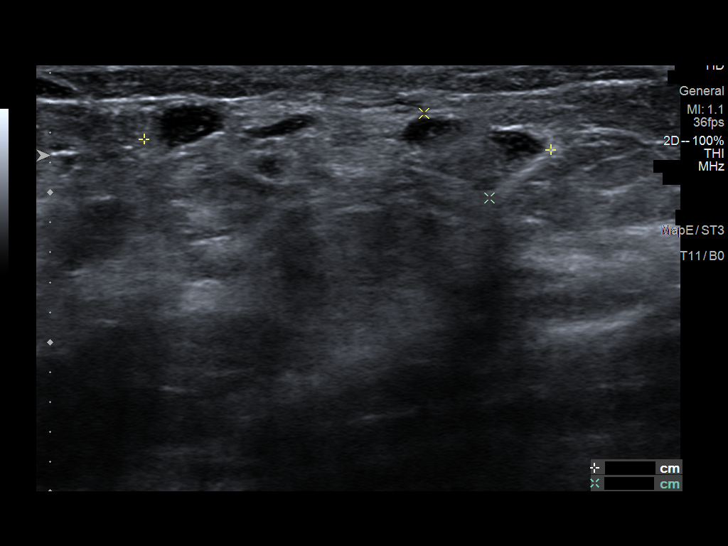
[im 8/12]
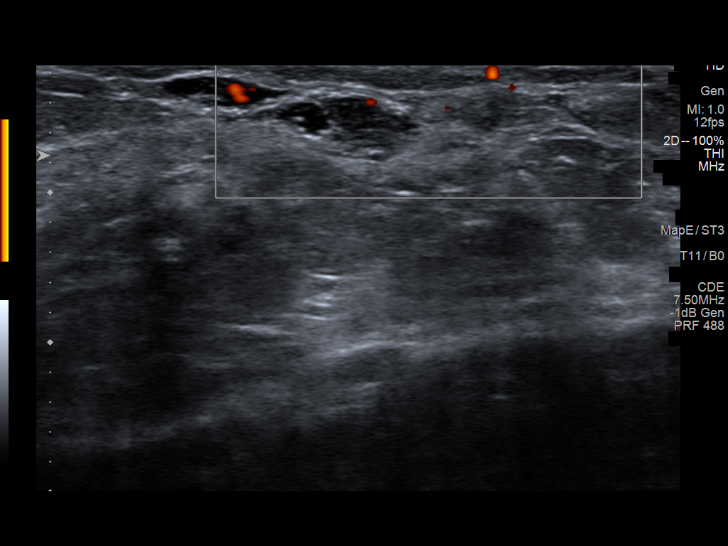
[im 9/12]
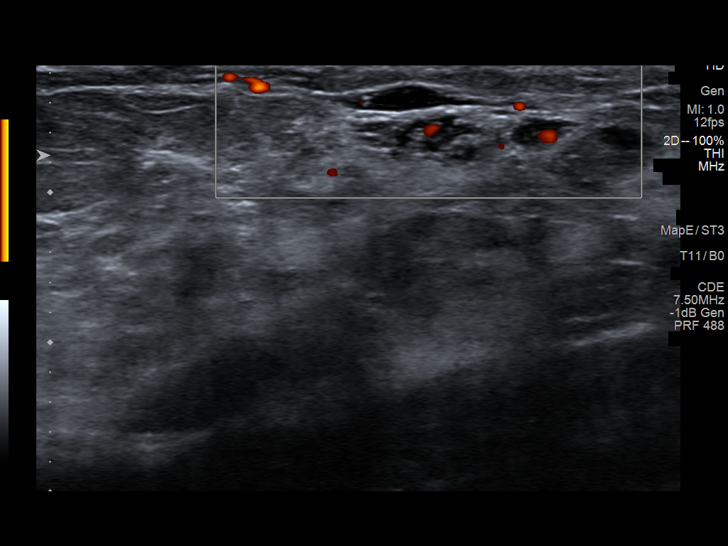
[im 10/12]
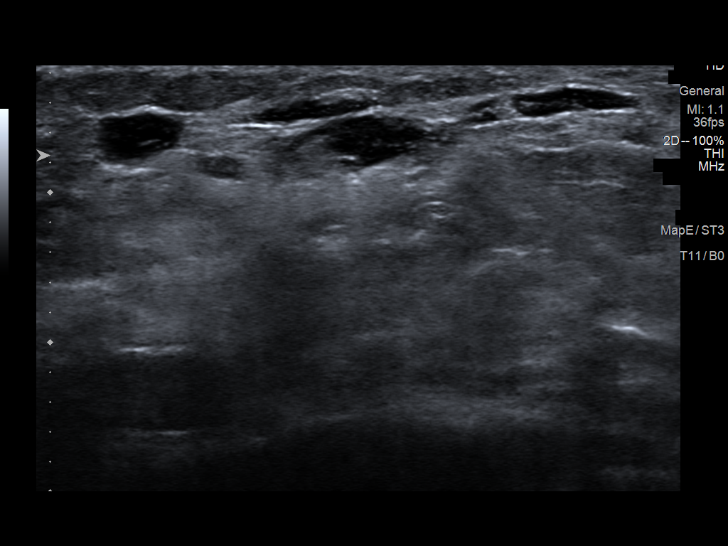
[im 11/12]
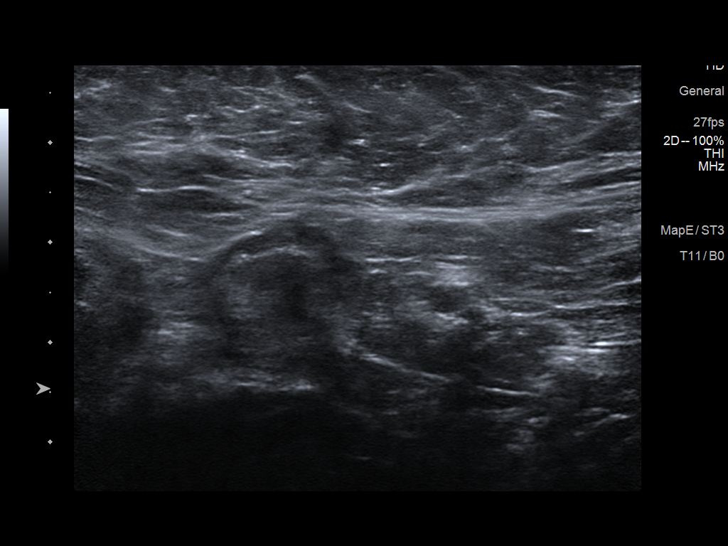
[im 12/12]
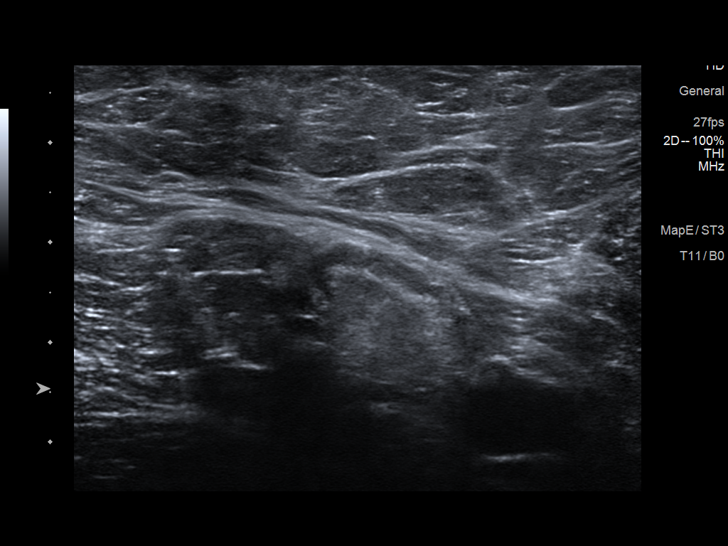

[12 of 12 positions shown; findings below may reference images not displayed]

ACR Breast Density Category c: The breast tissue is heterogeneously
dense, which may obscure small masses.
FINDINGS: Within the lateral aspect of the right breast there is a persistent
lobular density, further evaluated with spot compression and full
paddle tomosynthesis images.

Mammographic images were processed with CAD.

On physical exam, I palpate no discrete mass within the lateral
right breast.

Targeted ultrasound is performed, showing a 4.2 x 2.7 x 0.7 cm solid
and cystic mass right breast 9 o'clock position 7 cm from nipple,
corresponding with mammographic abnormality. No right axillary
adenopathy.
IMPRESSION: Indeterminate solid and cystic right breast mass.

RECOMMENDATION:
Ultrasound-guided core needle biopsy solid and cystic right breast
mass 9 o'clock position.

I have discussed the findings and recommendations with the patient.
Results were also provided in writing at the conclusion of the
visit. If applicable, a reminder letter will be sent to the patient
regarding the next appointment.

BI-RADS CATEGORY  4: Suspicious.

## 2017-12-10 ENCOUNTER — Ambulatory Visit
Admission: RE | Admit: 2017-12-10 | Discharge: 2017-12-10 | Disposition: A | Discharge status: Home / Self Care | Payer: BLUE CROSS/BLUE SHIELD | Source: Ambulatory Visit | Attending: Obstetrics and Gynecology | Admitting: Obstetrics and Gynecology

## 2017-12-10 DIAGNOSIS — N6489 Other specified disorders of breast: Secondary | ICD-10-CM

## 2017-12-10 DIAGNOSIS — N6313 Unspecified lump in the right breast, lower outer quadrant: Secondary | ICD-10-CM | POA: Diagnosis not present

## 2017-12-10 DIAGNOSIS — C8519 Unspecified B-cell lymphoma, extranodal and solid organ sites: Secondary | ICD-10-CM | POA: Diagnosis not present

## 2017-12-10 IMAGING — US US BREAST BX W LOC DEV 1ST LESION IMG BX SPEC US GUIDE*R*
1 series · 13 of 16 positions shown · non-contrast
Comparison: Prior studies

ADDENDUM:
Pathology revealed LOW GRADE NON-HODGKIN B-CELL LYMPHOMA WITH CD5
EXPRESSION of the Right breast, 8:30 o'clock. This was found to be
concordant by Dr. LEONARDO. Pathology results were discussed
with the patient by telephone. The patient reported doing well after
the biopsy with tenderness at the site. Post biopsy instructions and
care were reviewed and questions were answered. The patient was
encouraged to call The [REDACTED] for any
additional concerns.The patient has been seen by Dr. LEONARDO at
[facility] Waldenstrom Macroglobulinemia
and will return to see Dr. LEONARDO for follow up from this biopsy
result. Imaging and pathology reports were faxed to Dr. LEONARDO on
[DATE].

Pathology results reported by LEONARDO, RN on [DATE].
CLINICAL DATA: Patient presents for ultrasound-guided core biopsy
of mass in the 8:30 o'clock location of the right breast.
EXAM:
ULTRASOUND GUIDED RIGHT BREAST CORE NEEDLE BIOPSY

[Series 1: us breast bx w loc dev 1st lesion img bx spec us g · 0.06mm/px · 13 of 16 slices shown]
[im 1/16]
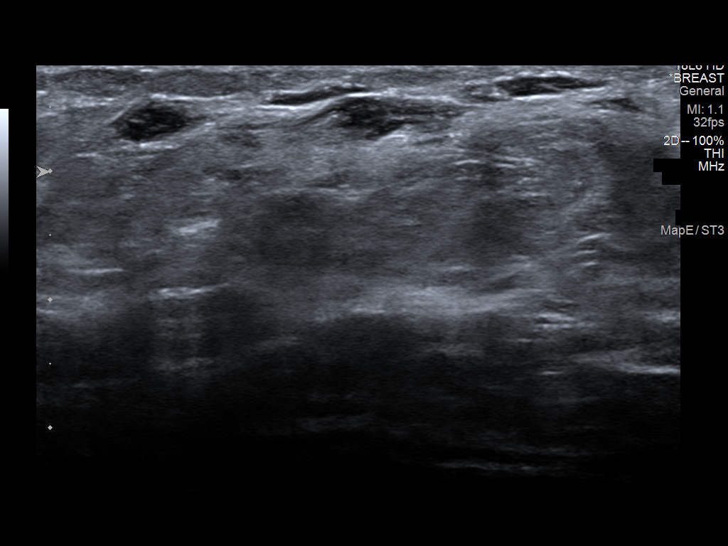
[im 2/16]
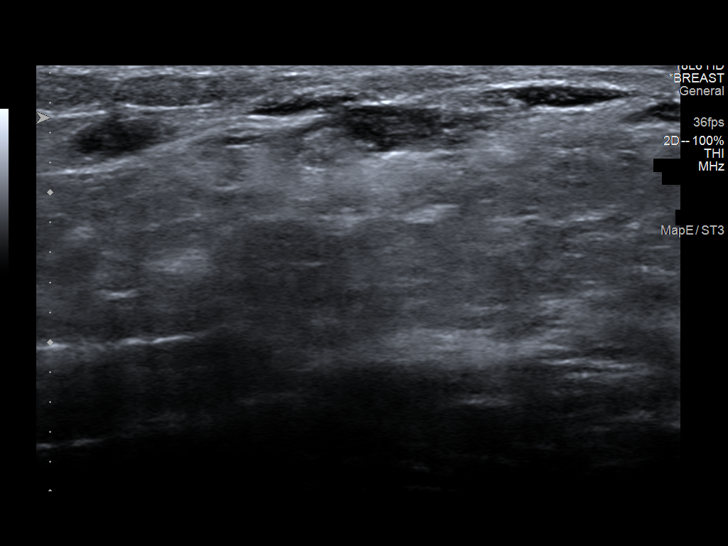
[im 4/16]
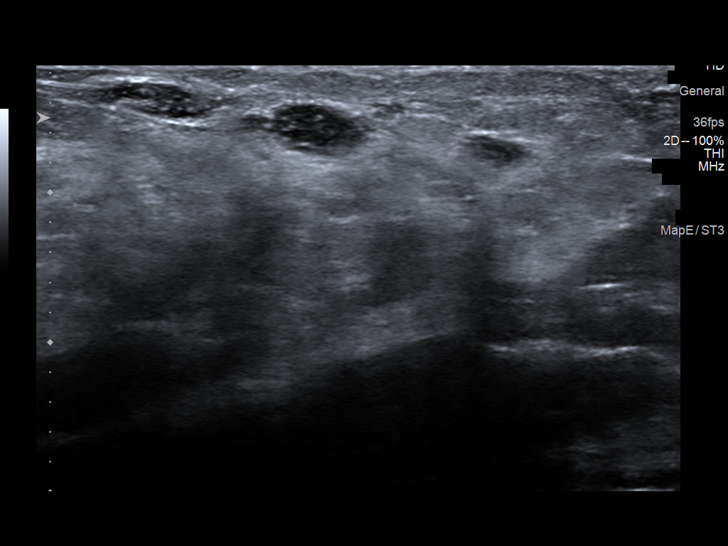
[im 5/16]
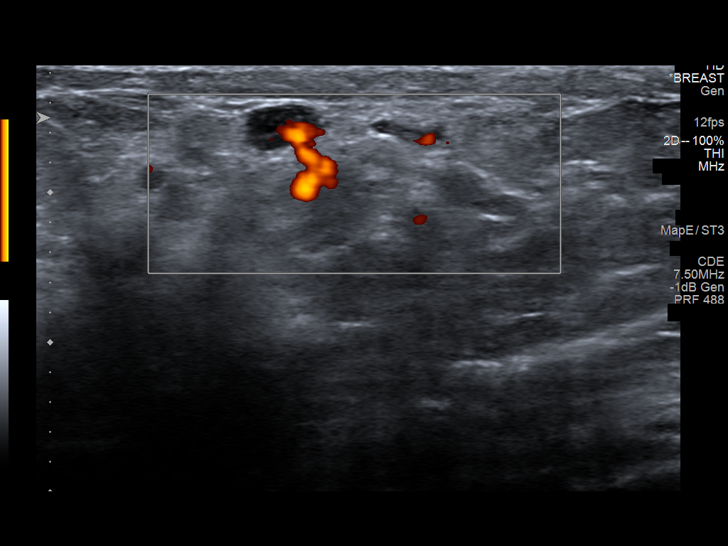
[im 6/16]
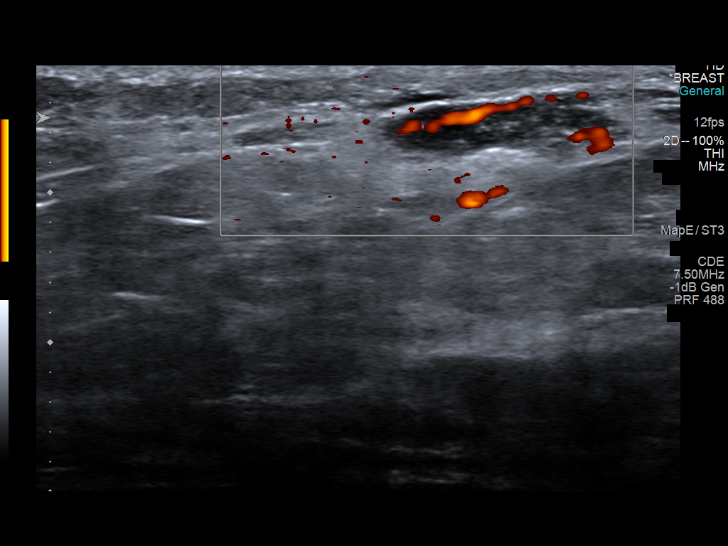
[im 7/16]
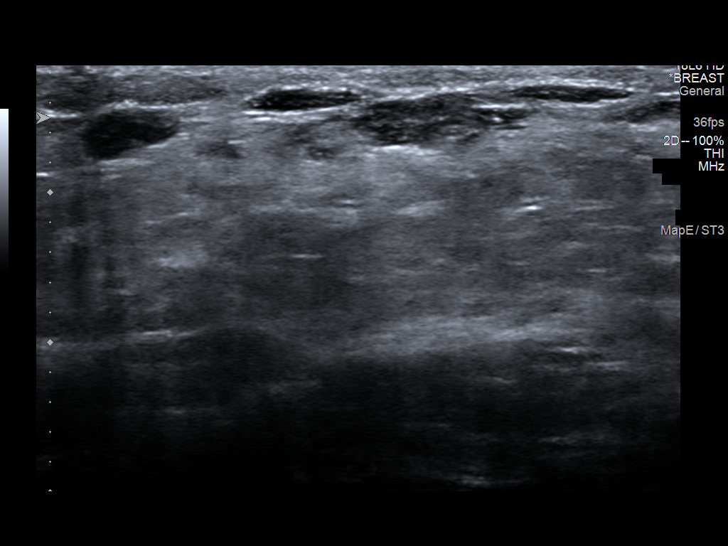
[im 9/16]
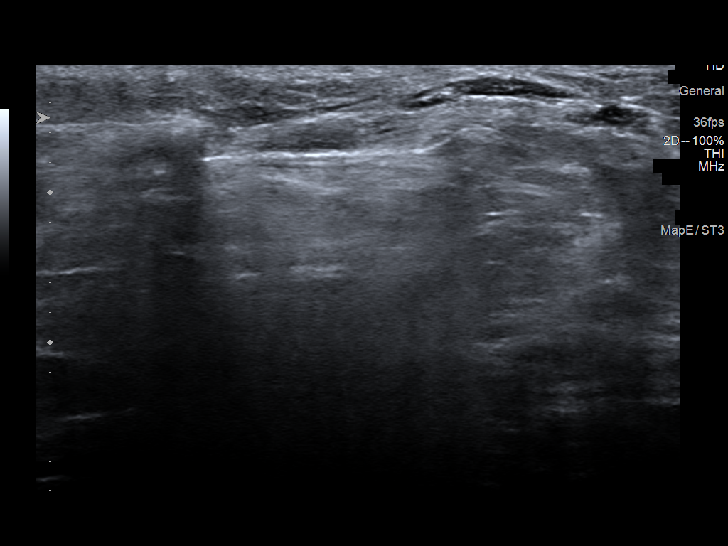
[im 10/16]
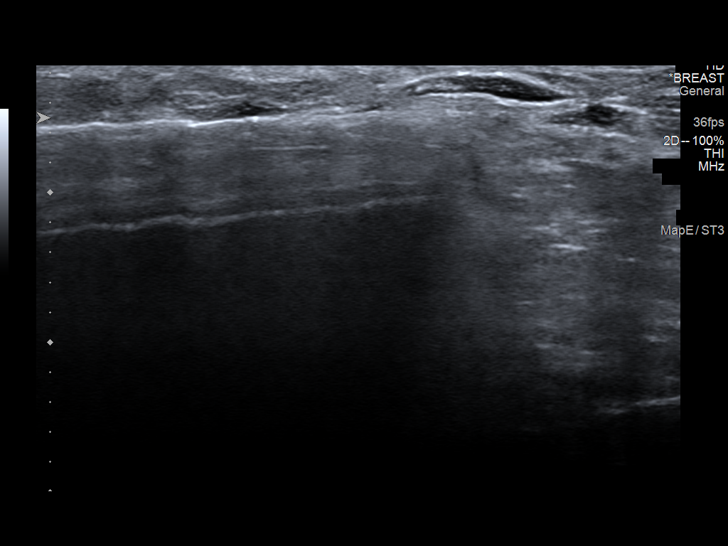
[im 11/16]
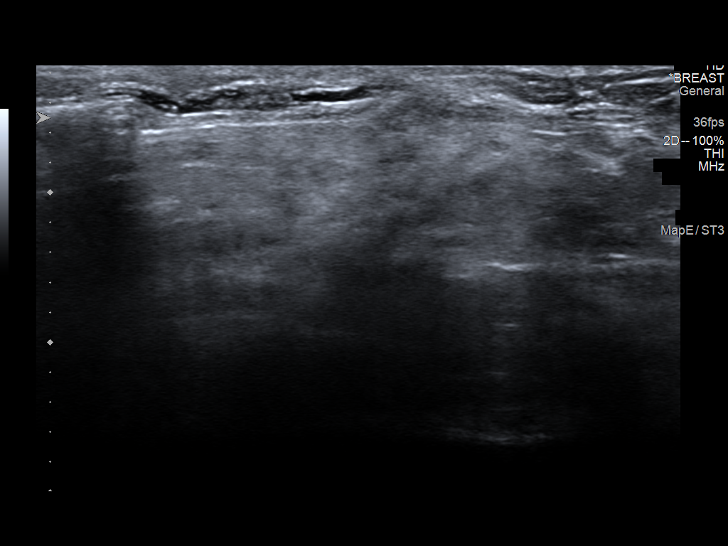
[im 12/16]
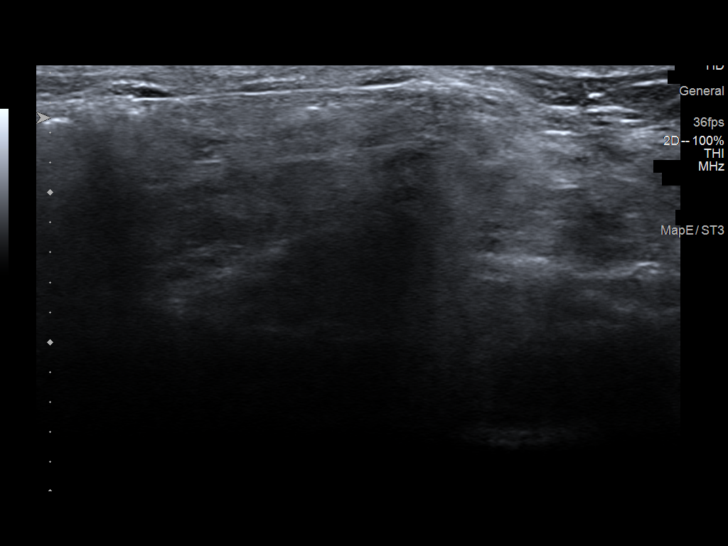
[im 13/16]
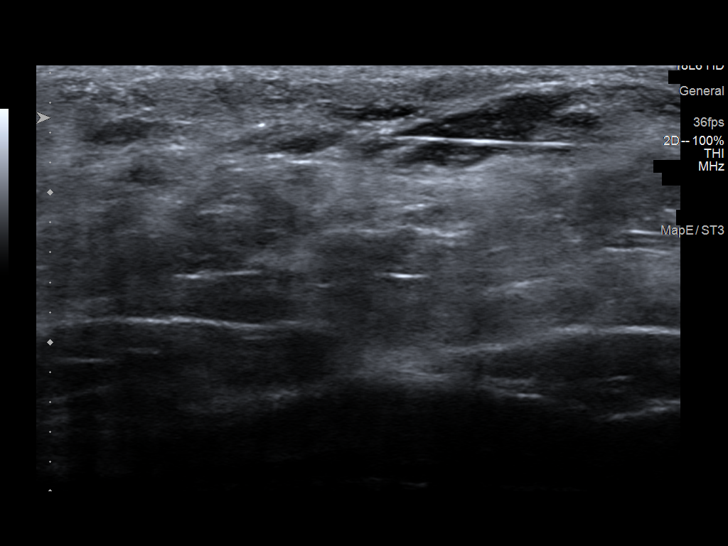
[im 15/16]
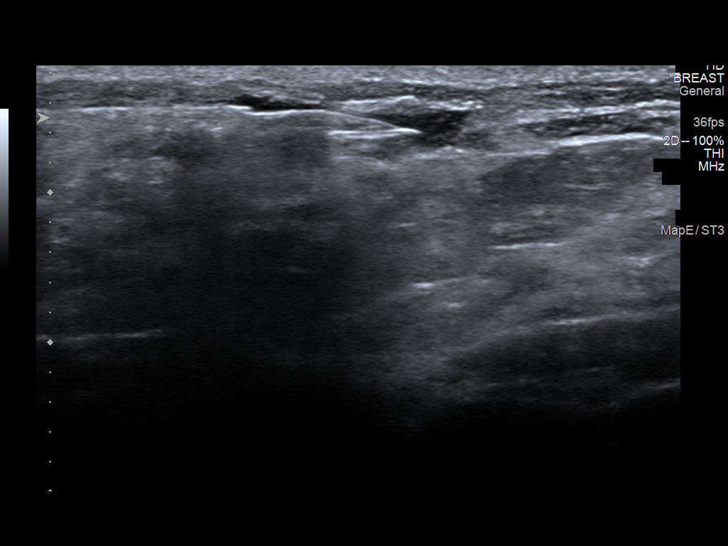
[im 16/16]
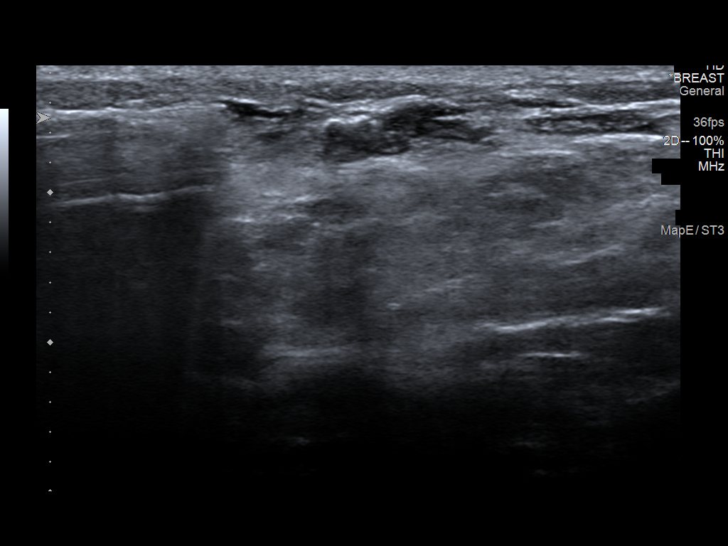

[13 of 16 positions shown; findings below may reference images not displayed]



Lesion quadrant: Lower outer quadrant right breast

Using sterile technique and 1% Lidocaine as local anesthetic, under
direct ultrasound visualization, a 12 gauge LEONARDO device was
used to perform biopsy of mass in the 830 o'clock location of the
right breast using a lateral approach. At the conclusion of the
procedure a ribbon shaped tissue marker clip was deployed into the
biopsy cavity. Follow up 2 view mammogram was performed and dictated
separately.
IMPRESSION: Ultrasound guided biopsy of right breast mass. No apparent
complications.

## 2017-12-10 IMAGING — MG MM CLIP PLACEMENT
2 series · 2 of 2 positions shown · non-contrast
Comparison: Previous exam(s).

CLINICAL DATA: Status post ultrasound-guided core biopsy of mass in
the [DATE] 8 o'clock location of the right breast.

EXAM:
DIAGNOSTIC RIGHT MAMMOGRAM POST ULTRASOUND BIOPSY

[R CC]
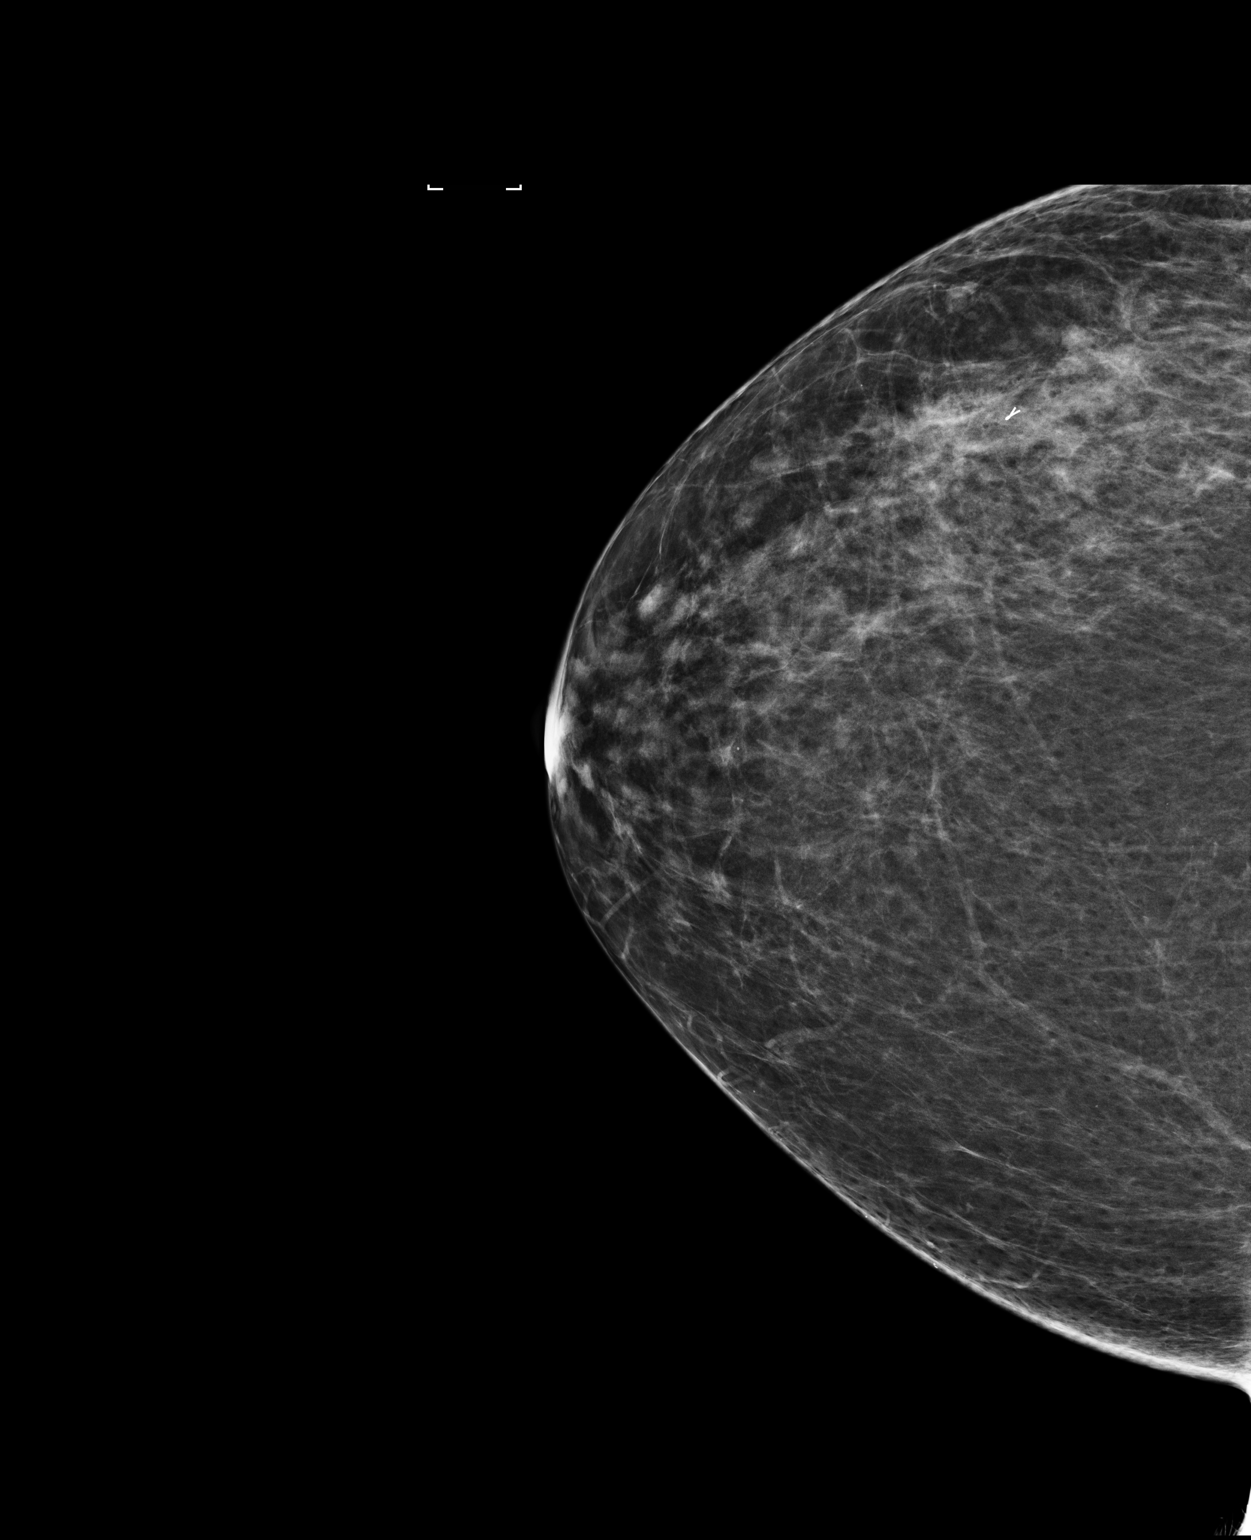

[R ML]
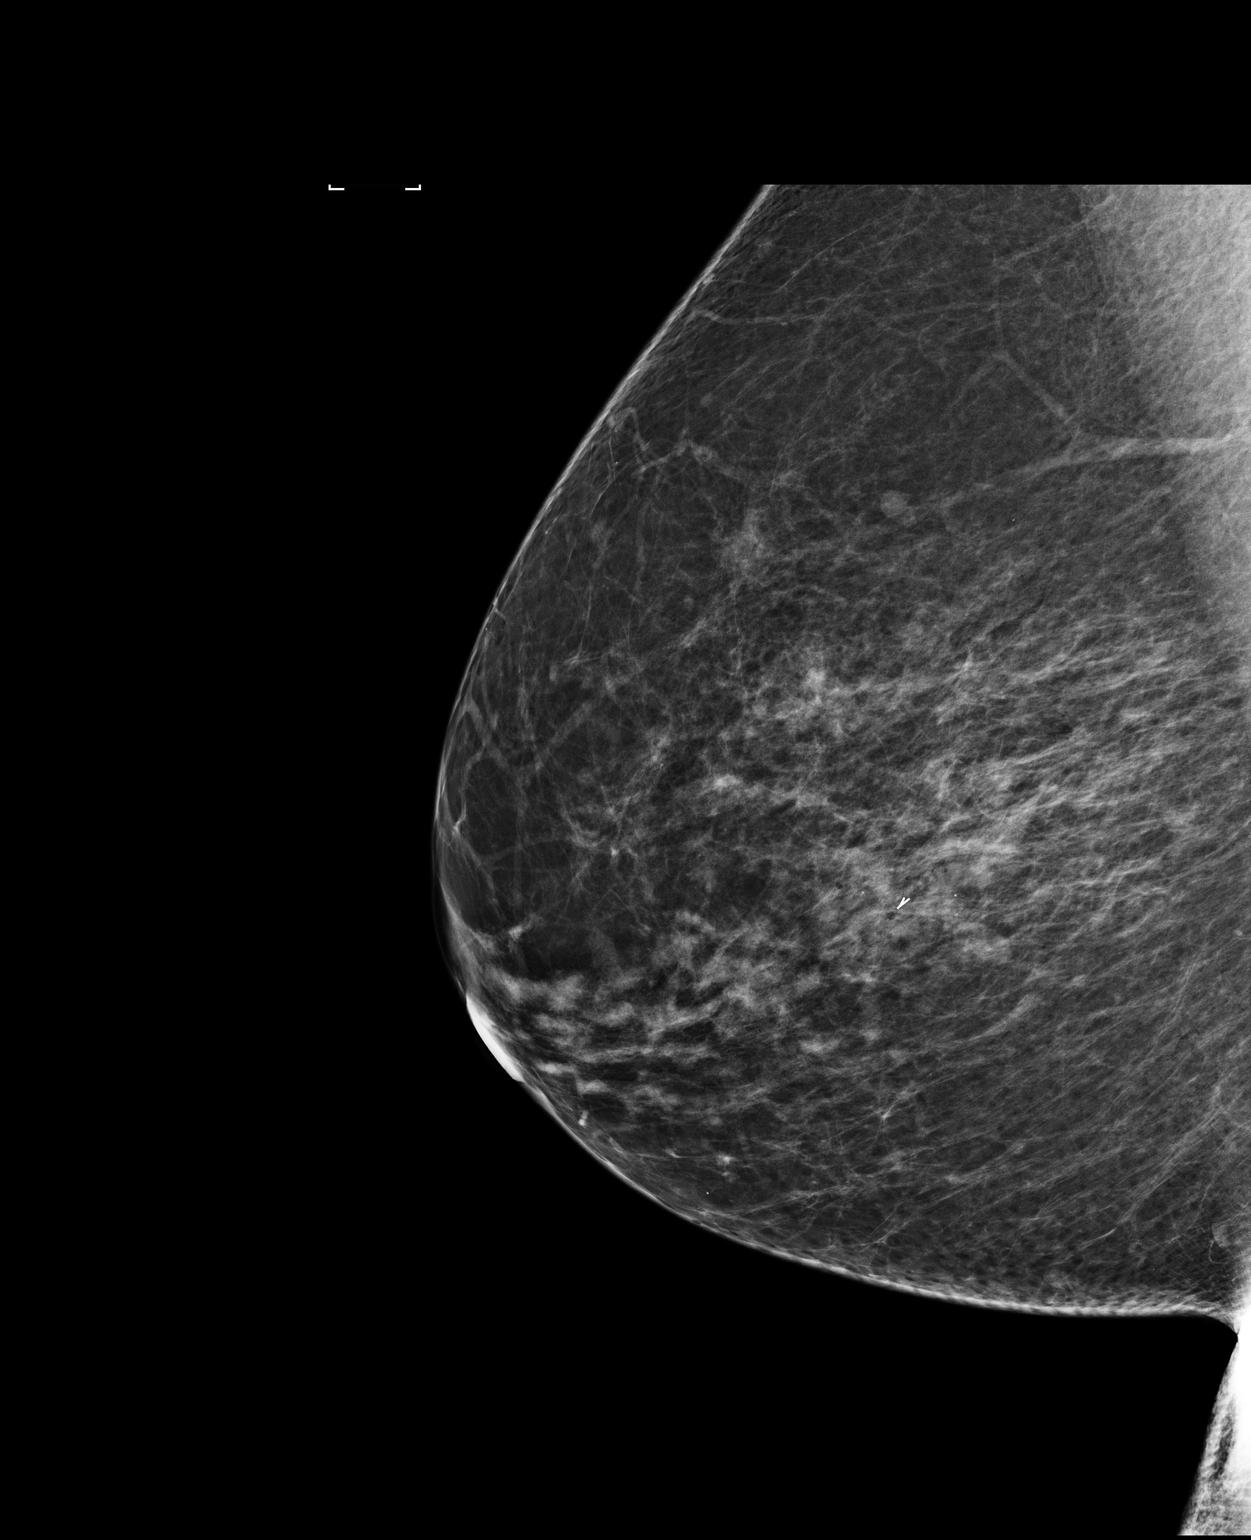

[2 of 2 positions shown; findings below may reference images not displayed]

FINDINGS: Mammographic images were obtained following ultrasound guided biopsy
of mass in the 8:30 o'clock location of the right breast. A ribbon
shaped clip is identified in the lower outer quadrant of the right
breast as expected.
IMPRESSION: Tissue marker clip in the expected location following biopsy.

Final Assessment: Post Procedure Mammograms for Marker Placement

## 2018-01-01 DIAGNOSIS — L570 Actinic keratosis: Secondary | ICD-10-CM | POA: Diagnosis not present

## 2018-01-01 DIAGNOSIS — L821 Other seborrheic keratosis: Secondary | ICD-10-CM | POA: Diagnosis not present

## 2018-01-06 ENCOUNTER — Ambulatory Visit: Payer: BLUE CROSS/BLUE SHIELD | Admitting: Family Medicine

## 2018-01-06 ENCOUNTER — Encounter: Payer: Self-pay | Admitting: Family Medicine

## 2018-01-06 VITALS — BP 126/79 | HR 84 | Temp 97.8°F | Wt 162.8 lb

## 2018-01-06 DIAGNOSIS — J0100 Acute maxillary sinusitis, unspecified: Secondary | ICD-10-CM | POA: Diagnosis not present

## 2018-01-06 NOTE — Patient Instructions (Addendum)
Rest, hydrate.  +/- flonase, mucinex (DM if cough), nettie pot or nasal saline.  Continue amoxicillin until completed. Continue nasal spray.  If cough present it can last up to 6-8 weeks.  F/U 2 weeks of not improved.     Sinusitis, Adult Sinusitis is soreness and inflammation of your sinuses. Sinuses are hollow spaces in the bones around your face. They are located:  Around your eyes.  In the middle of your forehead.  Behind your nose.  In your cheekbones.  Your sinuses and nasal passages are lined with a stringy fluid (mucus). Mucus normally drains out of your sinuses. When your nasal tissues get inflamed or swollen, the mucus can get trapped or blocked so air cannot flow through your sinuses. This lets bacteria, viruses, and funguses grow, and that leads to infection. Follow these instructions at home: Medicines  Take, use, or apply over-the-counter and prescription medicines only as told by your doctor. These may include nasal sprays.  If you were prescribed an antibiotic medicine, take it as told by your doctor. Do not stop taking the antibiotic even if you start to feel better. Hydrate and Humidify  Drink enough water to keep your pee (urine) clear or pale yellow.  Use a cool mist humidifier to keep the humidity level in your home above 50%.  Breathe in steam for 10-15 minutes, 3-4 times a day or as told by your doctor. You can do this in the bathroom while a hot shower is running.  Try not to spend time in cool or dry air. Rest  Rest as much as possible.  Sleep with your head raised (elevated).  Make sure to get enough sleep each night. General instructions  Put a warm, moist washcloth on your face 3-4 times a day or as told by your doctor. This will help with discomfort.  Wash your hands often with soap and water. If there is no soap and water, use hand sanitizer.  Do not smoke. Avoid being around people who are smoking (secondhand smoke).  Keep all follow-up  visits as told by your doctor. This is important. Contact a doctor if:  You have a fever.  Your symptoms get worse.  Your symptoms do not get better within 10 days. Get help right away if:  You have a very bad headache.  You cannot stop throwing up (vomiting).  You have pain or swelling around your face or eyes.  You have trouble seeing.  You feel confused.  Your neck is stiff.  You have trouble breathing. This information is not intended to replace advice given to you by your health care provider. Make sure you discuss any questions you have with your health care provider. Document Released: 06/03/2008 Document Revised: 08/11/2016 Document Reviewed: 10/11/2015 Elsevier Interactive Patient Education  [employer].

## 2018-01-06 NOTE — Progress Notes (Signed)
Paula [name] , 1951-10-30, 67 y.o., female MRN: [id number] Patient Care Team    Relationship Specialty Notifications Start End  Paula Kern, DO PCP - General Family Medicine  04/20/14   Paula Pigg, MD Consulting Physician Dermatology  08/10/15   Paula Medici, MD Referring Physician Internal Medicine  08/10/15     Chief Complaint  Patient presents with  . URI    pts complain of sore throat and congestion that started 12/30/2017. she was prescribe Ipratropium nasal solution and amoxicillin during a telephone encounter     Subjective: Pt presents for an OV with complaints of sore throat, cough, ear pressure and congestion  of  >1 week duration.  She denies fever, chills, nausea, vomit, diarrhea. Pt has tried sudafed and Atrovent nasal spray to ease their symptoms. She was prescribed amox TID dosing by E-Visit, but just started it yesterday.   Depression screen Paula [name] 2/9 08/10/2015  Decreased Interest 0  Down, Depressed, Hopeless 0  PHQ - 2 Score 0    Allergies  Allergen Reactions  . Ciprofloxacin Hcl     REACTION: nausea   Social History   Tobacco Use  . Smoking status: Never Smoker  . Smokeless tobacco: Never Used  Substance Use Topics  . Alcohol use: Yes    Alcohol/week: 3.6 oz    Types: 6 Glasses of wine per week   Past Medical History:  Diagnosis Date  . CARPAL TUNNEL SYNDROME, MILD 12/05/2008  . Chronic cough - seeing pulmonologist in [place], [facility] 03/14/2014  . HSV 03/01/2010  . Leg skin lesion, left 01/30/2015   precancerous cells per patient  . OSTEOPENIA 12/05/2008  . PARESTHESIA 09/01/2009  . Waldenstroms macroglobulinemia - followed by Dr. Carolynn [name], [place], [place] 03/15/2014   Past Surgical History:  Procedure Laterality Date  . CHOLECYSTECTOMY  02/2017  . LEG SKIN LESION  BIOPSY / EXCISION Left 01/30/2015   Dr Paula [name]-precancerous cells per patient  . ORIF TIBIA & FIBULA FRACTURES Right 1988  . TONSILLECTOMY AND  ADENOIDECTOMY  1958   Family History  Problem Relation Age of Onset  . Hypertension Mother   . Hypertension Brother   . Colon cancer Father 68  . Hypertension Son   . Cancer Neg Hx        lung ca - mother's side  . Arthritis Neg Hx        osteo - mother's side   Allergies as of 01/06/2018      Reactions   Ciprofloxacin Hcl    REACTION: nausea      Medication List        Accurate as of 01/06/18  1:52 PM. Always use your most recent med list.          PRESCRIPTION MEDICATION Face wash given by dermatologist (cannot recall name)       All past medical history, surgical history, allergies, family history, immunizations andmedications were updated in the EMR today and reviewed under the history and medication portions of their EMR.     ROS: Negative, with the exception of above mentioned in HPI   Objective:  BP 126/79 (BP Location: Left Arm, Patient Position: Sitting, Cuff Size: Normal)   Pulse 84   Temp 97.8 F (36.6 C) (Oral)   Wt 162 lb 12.8 oz (73.8 kg)   SpO2 96%   BMI 27.09 kg/m  Body mass index is 27.09 kg/m. Gen: Afebrile. No acute distress. Nontoxic in appearance, well developed, well nourished. Very pleasant  caucasian female.  HENT: AT. [name]. Bilateral TM visualized with mild erythema/pink hue left TM, no bulging, otherwise normal. MMM, no oral lesions. Bilateral nares with erythema and drainage. Throat without erythema or exudates. Mild PND present, mild cough, hoarseness.  Eyes:Pupils Equal Round Reactive to light, Extraocular movements intact,  Conjunctiva without redness, discharge or icterus. Neck/lymp/endocrine: Supple,no lymphadenopathy CV: RRR  Chest: CTAB, no wheeze or crackles. Good air movement, normal resp effort.  Skin: no rashes, purpura or petechiae.  Neuro: Normal gait. PERLA. EOMi. Alert. Oriented x3   No exam data present No results found. No results found for this or any previous visit (from the past 24 hour(s)).  Assessment/Plan: Paula [name] is a 67 y.o. female present for OV for  Acute maxillary sinusitis, recurrence not specified Pt with signs of bronchitis today, with mild sinus symptoms. She had been provided with amox TID dosing that just started yesterday evening.  Rest and Hydrate are important for her (recently dx NHL) +/- flonase, mucinex (DM if cough), nettie pot or nasal saline.  Continue amoxicillin until completed. Continue Atrovent nasal spray.  If cough present it can last up to 6-8 weeks.  F/U 2 weeks of not improved, sooner if worsening.    Reviewed expectations re: course of current medical issues.  Discussed self-management of symptoms.  Outlined signs and symptoms indicating need for more acute intervention.  Patient verbalized understanding and all questions were answered.  Patient received an After-Visit Summary.    No orders of the defined types were placed in this encounter.    Note is dictated utilizing voice recognition software. Although note has been proof read prior to signing, occasional typographical errors still can be missed. If any questions arise, please do not hesitate to call for verification.   electronically signed by:  Paula Pouch, DO  [place]

## 2018-01-09 DIAGNOSIS — K7689 Other specified diseases of liver: Secondary | ICD-10-CM | POA: Diagnosis not present

## 2018-01-09 DIAGNOSIS — C8590 Non-Hodgkin lymphoma, unspecified, unspecified site: Secondary | ICD-10-CM | POA: Diagnosis not present

## 2018-01-09 DIAGNOSIS — C880 Waldenstrom macroglobulinemia: Secondary | ICD-10-CM | POA: Diagnosis not present

## 2018-01-09 DIAGNOSIS — N281 Cyst of kidney, acquired: Secondary | ICD-10-CM | POA: Diagnosis not present

## 2018-01-09 DIAGNOSIS — J9811 Atelectasis: Secondary | ICD-10-CM | POA: Diagnosis not present

## 2018-01-09 DIAGNOSIS — J984 Other disorders of lung: Secondary | ICD-10-CM | POA: Diagnosis not present

## 2018-01-09 DIAGNOSIS — C9110 Chronic lymphocytic leukemia of B-cell type not having achieved remission: Secondary | ICD-10-CM | POA: Diagnosis not present

## 2018-01-09 DIAGNOSIS — N838 Other noninflammatory disorders of ovary, fallopian tube and broad ligament: Secondary | ICD-10-CM | POA: Diagnosis not present

## 2018-01-09 DIAGNOSIS — M7989 Other specified soft tissue disorders: Secondary | ICD-10-CM | POA: Diagnosis not present

## 2018-01-09 DIAGNOSIS — N6310 Unspecified lump in the right breast, unspecified quadrant: Secondary | ICD-10-CM | POA: Diagnosis not present

## 2018-01-09 DIAGNOSIS — C9190 Lymphoid leukemia, unspecified not having achieved remission: Secondary | ICD-10-CM | POA: Diagnosis not present

## 2018-01-09 DIAGNOSIS — R918 Other nonspecific abnormal finding of lung field: Secondary | ICD-10-CM | POA: Diagnosis not present

## 2018-01-21 DIAGNOSIS — C8309 Small cell B-cell lymphoma, extranodal and solid organ sites: Secondary | ICD-10-CM | POA: Diagnosis not present

## 2018-01-21 DIAGNOSIS — C9110 Chronic lymphocytic leukemia of B-cell type not having achieved remission: Secondary | ICD-10-CM | POA: Diagnosis not present

## 2018-01-21 DIAGNOSIS — N6310 Unspecified lump in the right breast, unspecified quadrant: Secondary | ICD-10-CM | POA: Diagnosis not present

## 2018-01-21 DIAGNOSIS — C880 Waldenstrom macroglobulinemia: Secondary | ICD-10-CM | POA: Diagnosis not present

## 2018-01-21 DIAGNOSIS — C9190 Lymphoid leukemia, unspecified not having achieved remission: Secondary | ICD-10-CM | POA: Diagnosis not present

## 2018-01-23 DIAGNOSIS — Z713 Dietary counseling and surveillance: Secondary | ICD-10-CM | POA: Diagnosis not present

## 2018-02-05 DIAGNOSIS — C880 Waldenstrom macroglobulinemia: Secondary | ICD-10-CM | POA: Diagnosis not present

## 2018-02-06 DIAGNOSIS — Z13820 Encounter for screening for osteoporosis: Secondary | ICD-10-CM | POA: Diagnosis not present

## 2018-02-12 DIAGNOSIS — Z6827 Body mass index (BMI) 27.0-27.9, adult: Secondary | ICD-10-CM | POA: Diagnosis not present

## 2018-02-12 DIAGNOSIS — Z9049 Acquired absence of other specified parts of digestive tract: Secondary | ICD-10-CM | POA: Diagnosis not present

## 2018-02-12 DIAGNOSIS — C880 Waldenstrom macroglobulinemia: Secondary | ICD-10-CM | POA: Diagnosis not present

## 2018-02-12 DIAGNOSIS — H539 Unspecified visual disturbance: Secondary | ICD-10-CM | POA: Diagnosis not present

## 2018-02-12 DIAGNOSIS — C9190 Lymphoid leukemia, unspecified not having achieved remission: Secondary | ICD-10-CM | POA: Diagnosis not present

## 2018-02-26 DIAGNOSIS — R351 Nocturia: Secondary | ICD-10-CM | POA: Diagnosis not present

## 2018-02-26 DIAGNOSIS — N3942 Incontinence without sensory awareness: Secondary | ICD-10-CM | POA: Diagnosis not present

## 2018-02-26 DIAGNOSIS — N393 Stress incontinence (female) (male): Secondary | ICD-10-CM | POA: Diagnosis not present

## 2018-02-26 DIAGNOSIS — R350 Frequency of micturition: Secondary | ICD-10-CM | POA: Diagnosis not present

## 2018-03-16 ENCOUNTER — Encounter: Payer: Self-pay | Admitting: Family Medicine

## 2018-03-16 DIAGNOSIS — C9110 Chronic lymphocytic leukemia of B-cell type not having achieved remission: Secondary | ICD-10-CM | POA: Insufficient documentation

## 2018-03-18 DIAGNOSIS — R351 Nocturia: Secondary | ICD-10-CM | POA: Diagnosis not present

## 2018-03-18 DIAGNOSIS — R350 Frequency of micturition: Secondary | ICD-10-CM | POA: Diagnosis not present

## 2018-03-18 DIAGNOSIS — N393 Stress incontinence (female) (male): Secondary | ICD-10-CM | POA: Diagnosis not present

## 2018-03-18 DIAGNOSIS — N3942 Incontinence without sensory awareness: Secondary | ICD-10-CM | POA: Diagnosis not present

## 2018-04-22 DIAGNOSIS — N3942 Incontinence without sensory awareness: Secondary | ICD-10-CM | POA: Diagnosis not present

## 2018-04-23 DIAGNOSIS — R350 Frequency of micturition: Secondary | ICD-10-CM | POA: Diagnosis not present

## 2018-04-23 DIAGNOSIS — M6281 Muscle weakness (generalized): Secondary | ICD-10-CM | POA: Diagnosis not present

## 2018-04-23 DIAGNOSIS — M62838 Other muscle spasm: Secondary | ICD-10-CM | POA: Diagnosis not present

## 2018-04-23 DIAGNOSIS — N393 Stress incontinence (female) (male): Secondary | ICD-10-CM | POA: Diagnosis not present

## 2018-04-24 DIAGNOSIS — M62838 Other muscle spasm: Secondary | ICD-10-CM | POA: Diagnosis not present

## 2018-04-24 DIAGNOSIS — M6281 Muscle weakness (generalized): Secondary | ICD-10-CM | POA: Diagnosis not present

## 2018-04-24 DIAGNOSIS — N3942 Incontinence without sensory awareness: Secondary | ICD-10-CM | POA: Diagnosis not present

## 2018-04-24 DIAGNOSIS — R102 Pelvic and perineal pain: Secondary | ICD-10-CM | POA: Diagnosis not present

## 2018-05-12 ENCOUNTER — Ambulatory Visit: Payer: BLUE CROSS/BLUE SHIELD | Admitting: Family Medicine

## 2018-05-12 DIAGNOSIS — L821 Other seborrheic keratosis: Secondary | ICD-10-CM | POA: Diagnosis not present

## 2018-05-12 DIAGNOSIS — L738 Other specified follicular disorders: Secondary | ICD-10-CM | POA: Diagnosis not present

## 2018-05-12 DIAGNOSIS — L570 Actinic keratosis: Secondary | ICD-10-CM | POA: Diagnosis not present

## 2018-05-13 DIAGNOSIS — H04123 Dry eye syndrome of bilateral lacrimal glands: Secondary | ICD-10-CM | POA: Diagnosis not present

## 2018-06-01 DIAGNOSIS — M6281 Muscle weakness (generalized): Secondary | ICD-10-CM | POA: Diagnosis not present

## 2018-06-01 DIAGNOSIS — M62838 Other muscle spasm: Secondary | ICD-10-CM | POA: Diagnosis not present

## 2018-06-01 DIAGNOSIS — N393 Stress incontinence (female) (male): Secondary | ICD-10-CM | POA: Diagnosis not present

## 2018-06-01 DIAGNOSIS — R102 Pelvic and perineal pain: Secondary | ICD-10-CM | POA: Diagnosis not present

## 2018-06-08 DIAGNOSIS — C880 Waldenstrom macroglobulinemia: Secondary | ICD-10-CM | POA: Diagnosis not present

## 2018-06-12 DIAGNOSIS — M62838 Other muscle spasm: Secondary | ICD-10-CM | POA: Diagnosis not present

## 2018-06-12 DIAGNOSIS — M6281 Muscle weakness (generalized): Secondary | ICD-10-CM | POA: Diagnosis not present

## 2018-06-12 DIAGNOSIS — R102 Pelvic and perineal pain: Secondary | ICD-10-CM | POA: Diagnosis not present

## 2018-06-12 DIAGNOSIS — N393 Stress incontinence (female) (male): Secondary | ICD-10-CM | POA: Diagnosis not present

## 2018-06-25 DIAGNOSIS — D2261 Melanocytic nevi of right upper limb, including shoulder: Secondary | ICD-10-CM | POA: Diagnosis not present

## 2018-06-25 DIAGNOSIS — L821 Other seborrheic keratosis: Secondary | ICD-10-CM | POA: Diagnosis not present

## 2018-06-25 DIAGNOSIS — Z85828 Personal history of other malignant neoplasm of skin: Secondary | ICD-10-CM | POA: Diagnosis not present

## 2018-06-25 DIAGNOSIS — D225 Melanocytic nevi of trunk: Secondary | ICD-10-CM | POA: Diagnosis not present

## 2018-07-01 DIAGNOSIS — N393 Stress incontinence (female) (male): Secondary | ICD-10-CM | POA: Diagnosis not present

## 2018-07-01 DIAGNOSIS — R102 Pelvic and perineal pain: Secondary | ICD-10-CM | POA: Diagnosis not present

## 2018-07-01 DIAGNOSIS — M6281 Muscle weakness (generalized): Secondary | ICD-10-CM | POA: Diagnosis not present

## 2018-07-01 DIAGNOSIS — M62838 Other muscle spasm: Secondary | ICD-10-CM | POA: Diagnosis not present

## 2018-07-06 DIAGNOSIS — N6310 Unspecified lump in the right breast, unspecified quadrant: Secondary | ICD-10-CM | POA: Diagnosis not present

## 2018-07-06 DIAGNOSIS — N63 Unspecified lump in unspecified breast: Secondary | ICD-10-CM | POA: Diagnosis not present

## 2018-07-06 DIAGNOSIS — Z9889 Other specified postprocedural states: Secondary | ICD-10-CM | POA: Diagnosis not present

## 2018-07-06 DIAGNOSIS — C880 Waldenstrom macroglobulinemia: Secondary | ICD-10-CM | POA: Diagnosis not present

## 2018-07-06 DIAGNOSIS — C9190 Lymphoid leukemia, unspecified not having achieved remission: Secondary | ICD-10-CM | POA: Diagnosis not present

## 2018-07-24 ENCOUNTER — Ambulatory Visit: Payer: BLUE CROSS/BLUE SHIELD | Admitting: Family Medicine

## 2018-07-27 DIAGNOSIS — N393 Stress incontinence (female) (male): Secondary | ICD-10-CM | POA: Diagnosis not present

## 2018-07-27 DIAGNOSIS — R102 Pelvic and perineal pain: Secondary | ICD-10-CM | POA: Diagnosis not present

## 2018-07-27 DIAGNOSIS — M62838 Other muscle spasm: Secondary | ICD-10-CM | POA: Diagnosis not present

## 2018-07-27 DIAGNOSIS — M6281 Muscle weakness (generalized): Secondary | ICD-10-CM | POA: Diagnosis not present

## 2018-08-04 ENCOUNTER — Encounter: Payer: BLUE CROSS/BLUE SHIELD | Admitting: Family Medicine

## 2018-08-04 ENCOUNTER — Other Ambulatory Visit: Payer: Self-pay | Admitting: Oncology

## 2018-08-04 DIAGNOSIS — C880 Waldenstrom macroglobulinemia: Secondary | ICD-10-CM

## 2018-08-10 NOTE — Progress Notes (Signed)
error 

## 2018-08-11 ENCOUNTER — Ambulatory Visit (INDEPENDENT_AMBULATORY_CARE_PROVIDER_SITE_OTHER): Payer: BLUE CROSS/BLUE SHIELD

## 2018-08-11 ENCOUNTER — Ambulatory Visit (INDEPENDENT_AMBULATORY_CARE_PROVIDER_SITE_OTHER): Payer: BLUE CROSS/BLUE SHIELD | Admitting: Family Medicine

## 2018-08-11 ENCOUNTER — Telehealth: Payer: Self-pay | Admitting: Family Medicine

## 2018-08-11 ENCOUNTER — Encounter: Payer: Self-pay | Admitting: Family Medicine

## 2018-08-11 VITALS — BP 110/70 | HR 73 | Temp 98.2°F | Ht 64.5 in | Wt 161.5 lb

## 2018-08-11 DIAGNOSIS — Z23 Encounter for immunization: Secondary | ICD-10-CM

## 2018-08-11 DIAGNOSIS — R0982 Postnasal drip: Secondary | ICD-10-CM

## 2018-08-11 DIAGNOSIS — Z7189 Other specified counseling: Secondary | ICD-10-CM | POA: Diagnosis not present

## 2018-08-11 DIAGNOSIS — R059 Cough, unspecified: Secondary | ICD-10-CM

## 2018-08-11 DIAGNOSIS — R739 Hyperglycemia, unspecified: Secondary | ICD-10-CM | POA: Diagnosis not present

## 2018-08-11 DIAGNOSIS — Z1331 Encounter for screening for depression: Secondary | ICD-10-CM

## 2018-08-11 DIAGNOSIS — E663 Overweight: Secondary | ICD-10-CM | POA: Diagnosis not present

## 2018-08-11 DIAGNOSIS — R0602 Shortness of breath: Secondary | ICD-10-CM | POA: Diagnosis not present

## 2018-08-11 DIAGNOSIS — Z7185 Encounter for immunization safety counseling: Secondary | ICD-10-CM

## 2018-08-11 DIAGNOSIS — R05 Cough: Secondary | ICD-10-CM

## 2018-08-11 IMAGING — DX DG CHEST 2V
2 series · 2 of 2 positions shown · non-contrast
Comparison: [DATE]

CLINICAL DATA: Cough and congestion for 7 weeks.  Shortness breath.

EXAM:
CHEST - 2 VIEW

[chest pa]
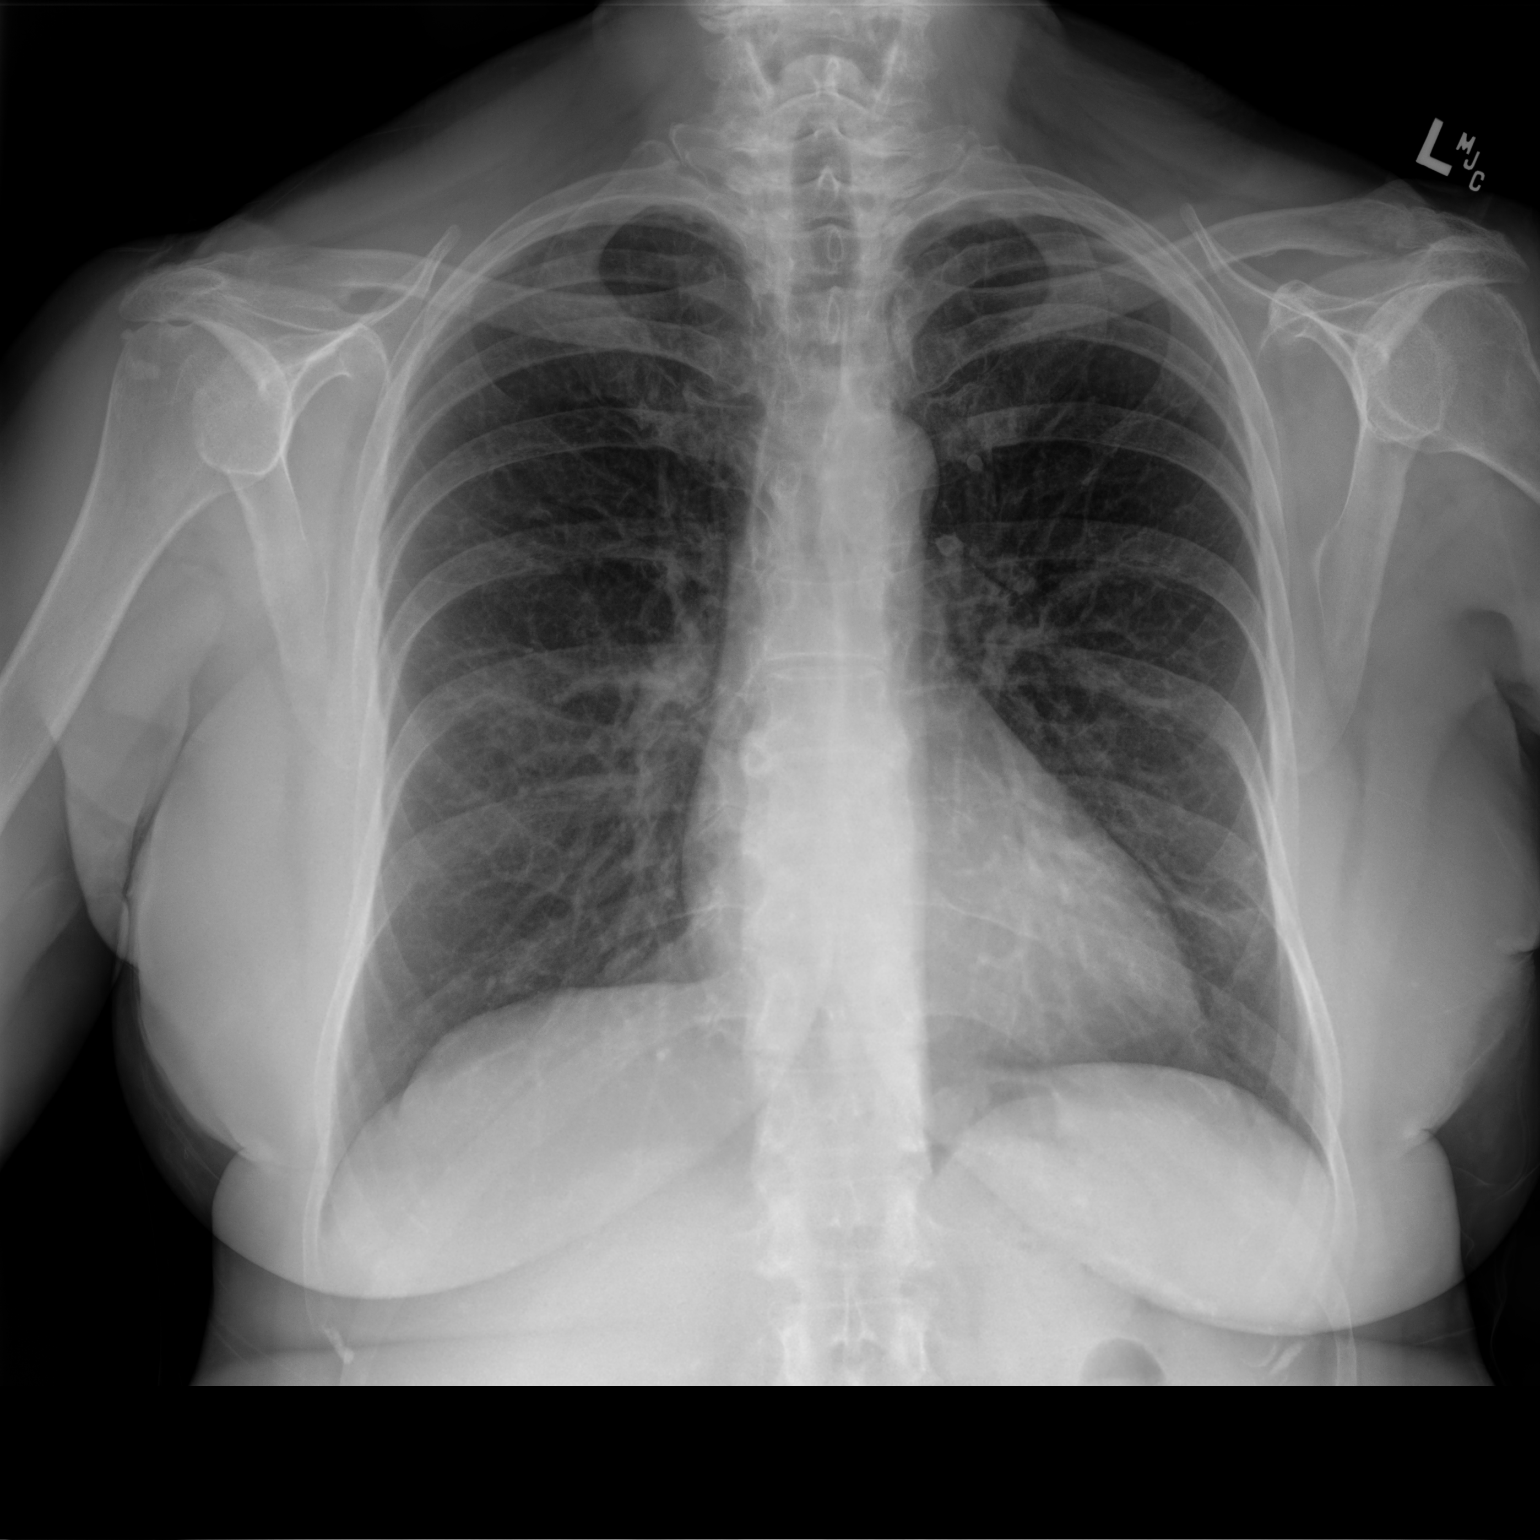

[chest lat]
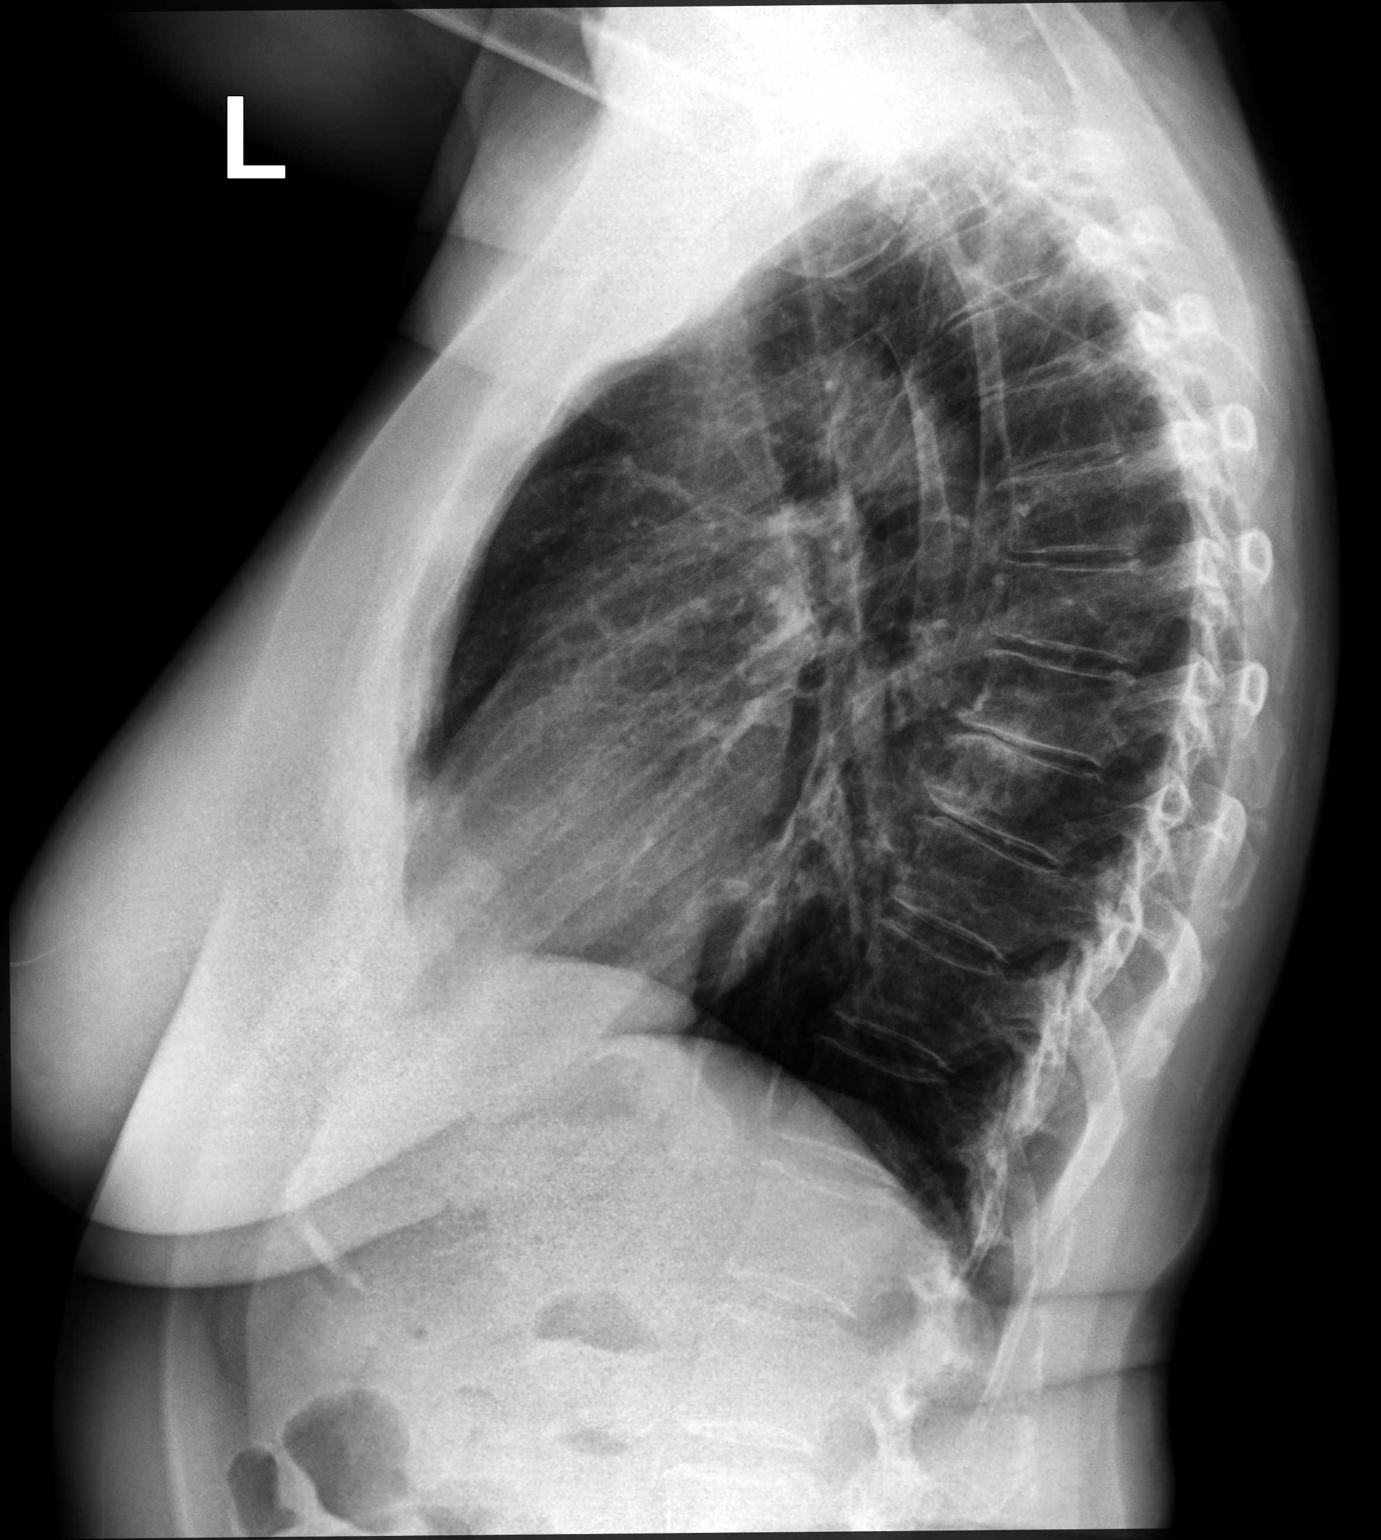

[2 of 2 positions shown; findings below may reference images not displayed]

FINDINGS: Mild central airway thickening. Linear subsegmental atelectasis or
scarring along the left lung base. The lungs appear otherwise clear.
Cardiac and mediastinal margins appear normal. No pleural effusion.

Thoracic spondylosis.
IMPRESSION: 1. Airway thickening is present, suggesting bronchitis or reactive
airways disease.
2. Subsegmental atelectasis or scarring at the left lung base.

## 2018-08-11 MED ORDER — ALBUTEROL SULFATE HFA 108 (90 BASE) MCG/ACT IN AERS: 2.0000 | INHALATION_SPRAY | Freq: Four times a day (QID) | RESPIRATORY_TRACT | 0 refills | Status: DC | PRN

## 2018-08-11 NOTE — Patient Instructions (Signed)
BEFORE YOU LEAVE: -pneumococcal vaccine -she wants to get new shingles vaccine or schedule if out of stock -labs -xray -follow up:  1) lab appointment in next 1-2 weeks for fasting labs 2) follow up or CPE in 3 months   We have ordered labs or studies at this visit. It can take up to 1-2 weeks for results and processing. IF results require follow up or explanation, we will call you with instructions. Clinically stable results will be released to your [facility]. If you have not heard from [place] or cannot find your results in [facility] in 2 weeks please contact our office at [phone].  If you are not yet signed up for [facility], please consider signing up.  See the Ear nose and throat specialist, particularly if symptoms persist in 2 weeks or if they worsen.   We recommend the following healthy lifestyle for LIFE: 1) Small portions. But, make sure to get regular (at least 3 per day), healthy meals and small healthy snacks if needed.  2) Eat a healthy clean diet.   TRY TO EAT: -at least 5-7 servings of low sugar, colorful, and nutrient rich vegetables per day (not corn, potatoes or bananas.) -berries are the best choice if you wish to eat fruit (only eat small amounts if trying to reduce weight)  -lean meets (fish, white meat of chicken or [place]) -vegan proteins for some meals - beans or tofu, whole grains, nuts and seeds -Replace bad fats with good fats - good fats include: fish, nuts and seeds, canola oil, olive oil -small amounts of low fat or non fat dairy -small amounts of100 % whole grains - check the lables -drink plenty of water  AVOID: -SUGAR, sweets, anything with added sugar, corn syrup or sweeteners - must read labels as even foods advertised as "healthy" often are loaded with sugar -if you must have a sweetener, small amounts of stevia may be best -sweetened beverages and artificially sweetened beverages -simple starches (rice, bread, potatoes, pasta, chips, etc - small  amounts of 100% whole grains are ok) -red meat, pork, butter -fried foods, fast food, processed food, excessive dairy, eggs and coconut.  3)Get at least 150 minutes of sweaty aerobic exercise per week.  4)Reduce stress - consider counseling, meditation and relaxation to balance other aspects of your life.

## 2018-08-11 NOTE — Telephone Encounter (Signed)
Pt added to wait list.

## 2018-08-11 NOTE — Telephone Encounter (Signed)
Pt was in the office today inquiring about the Shingrix vaccine and she is aware that it is on order and that someone will call ([phone]) from the office when the vaccine is in.

## 2018-08-11 NOTE — Progress Notes (Signed)
HPI:  Using dictation device. Unfortunately this device frequently misinterprets words/phrases.  Paula [name] is a pleasant 67 yo F with a PMH significant for Paula [name] and a history of chronic cough whom was scheduled for a CPE, but would like to instead do a visit to address several issues:  Cough, PND: -x 6 weeks -hx chronic issues in the past and saw a pulmonologist in [place]- she seems to remember that an inhaler helped some prn for this and requests I send one -this started acutely, resp illness initially with PND, productive cough, malaise -no fevers, SOB, hemoptysis, sneezing -denies hx allergies -symptoms progressively improving but still with cough -took zpack her oncologist gave her  Hyperglycemia: -reports does lipid and diabetes screening at work and was told needed to follow up with PCP for hyperglycemia -she would like to check lipids as well as has had some wt gain, not exercising as much with recent URI  Urinary incontinence: -seeing urology for this -doing pelvic floor PT and this is helping -cough is the main time she has issues  Abnormal Mammogram: -seeing the breast center and has follow up scheduled  Continues to see oncology every 3 months with labs. Reports remains off medications.  Want pneumonia vaccine and shingles vaccine. Reports her oncologist told her she could get both now with the new shingles vaccine. She wants to set this up.   -Alcohol, Tobacco, drug use: see social history  Review of Systems - no reported fevers, unintentional weight loss, vision loss, hearing loss, chest pain, sob, hemoptysis, melena, hematochezia, hematuria, genital discharge, changing or concerning skin lesions, bleeding, bruising, loc, thoughts of self harm or SI  Past Medical History:  Diagnosis Date  . CARPAL TUNNEL SYNDROME, MILD 12/05/2008  . Chronic cough - seeing pulmonologist in [place], [facility] 03/14/2014  . HSV 03/01/2010  . Leg skin  lesion, left 01/30/2015   precancerous cells per patient  . OSTEOPENIA 12/05/2008  . PARESTHESIA 09/01/2009  . Waldenstroms macroglobulinemia - followed by Dr. Carolynn [name], [place], [place] 03/15/2014    Past Surgical History:  Procedure Laterality Date  . CHOLECYSTECTOMY  02/2017  . LEG SKIN LESION  BIOPSY / EXCISION Left 01/30/2015   Dr Paula [name]-precancerous cells per patient  . ORIF TIBIA & FIBULA FRACTURES Right 1988  . TONSILLECTOMY AND ADENOIDECTOMY  1958    Family History  Problem Relation Age of Onset  . Hypertension Mother   . Hypertension Brother   . Colon cancer Father 79  . Hypertension Son   . Cancer Neg Hx        lung ca - mother's side  . Arthritis Neg Hx        osteo - mother's side    Social History   Socioeconomic History  . Marital status: Married    Spouse name: Not on file  . Number of children: Not on file  . Years of education: Not on file  . Highest education level: Not on file  Occupational History  . Not on file  Social Needs  . Financial resource strain: Not on file  . Food insecurity:    Worry: Not on file    Inability: Not on file  . Transportation needs:    Medical: Not on file    Non-medical: Not on file  Tobacco Use  . Smoking status: Never Smoker  . Smokeless tobacco: Never Used  Substance and Sexual Activity  . Alcohol use: Yes    Alcohol/week: 6.0 standard drinks  Types: 6 Glasses of wine per week  . Drug use: No  . Sexual activity: Not on file  Lifestyle  . Physical activity:    Days per week: Not on file    Minutes per session: Not on file  . Stress: Not on file  Relationships  . Social connections:    Talks on phone: Not on file    Gets together: Not on file    Attends religious service: Not on file    Active member of club or organization: Not on file    Attends meetings of clubs or organizations: Not on file    Relationship status: Not on file  Other Topics Concern  . Not on file  Social History Narrative    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:      Spiritual Beliefs:      Lifestyle:             No current outpatient medications on file.  EXAM:  Vitals:   08/11/18 1102  BP: 110/70  Pulse: 73  Temp: 98.2 F (36.8 C)  Body mass index is 27.29 kg/m.   GENERAL: vitals reviewed and listed below, alert, oriented, appears well hydrated and in no acute distress  HEENT: head atraumatic, PERRLA, normal appearance of eyes, ears, nose and mouth. moist mucus membranes. Normal appearance of ear canals and TMs, clear nasal congestion, mild post oropharyngeal erythema with PND, no tonsillar edema or exudate, no sinus TTP  NECK: supple, no masses or lymphadenopathy  LUNGS: clear to auscultation bilaterally, no rales, rhonchi or wheeze  CV: HRRR, no peripheral edema or cyanosis, normal pedal pulses  MS: normal gait, moves all extremities normally  NEURO: normal gait, speech and thought processing grossly intact, muscle tone grossly intact throughout  PSYCH: normal affect, pleasant and cooperative  ASSESSMENT AND PLAN:  More than 50% of over 30 minutes spent in total in caring for this patient was spent face-to-face with the patient, counseling and/or coordinating care.   Discussed the following assessment and plan:  1. Cough -we discussed possible serious and likely etiologies, workup and treatment, treatment risks and return precautions -spent a long time discussing the normal symptoms of VURIs and prevalence, also signs and symptoms of bacterial illness, also the most common causes of a chronic cough including PND and silent reflux -after this discussion, Paula [name] opted for CXR today to exclude LRI or other, ENT eval if does not continue to resolve over the next 1-2 weeks; prn alb per prior hx and pt request; also advised she discuss with her oncologist the risks of a SBI population and when to appropriately use abx for resp illnesses -follow up advised as needed -of  course, we advised [name]  to return or notify a doctor immediately if symptoms worsen or persist or new concerns arise.  2. Screening for depression -see phq9  3. PND (post-nasal drip) -see above  4. Vaccine counseling -she opted for her pneumo vaccine today and to schedule the shingles vaccine when in stock  5. Hyperglycemia - Hemoglobin A1c; Future -lifestyle recs  6. Overweight (BMI 25.0-29.9) - Lipid panel; Future -lifestyle recs  7. Need for prophylactic vaccination against Streptococcus pneumoniae (pneumococcus)  - Pneumococcal polysaccharide vaccine 23-valent greater than or equal to 2yo subcutaneous/IM    Patient advised to return to clinic immediately if symptoms worsen or persist or new concerns.  Patient Instructions  BEFORE YOU LEAVE: -pneumococcal vaccine -she wants to get new shingles  vaccine or schedule if out of stock -labs -xray -follow up:  1) lab appointment in next 1-2 weeks for fasting labs 2) follow up or CPE in 3 months   We have ordered labs or studies at this visit. It can take up to 1-2 weeks for results and processing. IF results require follow up or explanation, we will call you with instructions. Clinically stable results will be released to your [facility]. If you have not heard from [place] or cannot find your results in [facility] in 2 weeks please contact our office at [phone].  If you are not yet signed up for [facility], please consider signing up.  See the Ear nose and throat specialist, particularly if symptoms persist in 2 weeks or if they worsen.   We recommend the following healthy lifestyle for LIFE: 1) Small portions. But, make sure to get regular (at least 3 per day), healthy meals and small healthy snacks if needed.  2) Eat a healthy clean diet.   TRY TO EAT: -at least 5-7 servings of low sugar, colorful, and nutrient rich vegetables per day (not corn, potatoes or bananas.) -berries are the best choice if you wish to eat fruit (only  eat small amounts if trying to reduce weight)  -lean meets (fish, white meat of chicken or [place]) -vegan proteins for some meals - beans or tofu, whole grains, nuts and seeds -Replace bad fats with good fats - good fats include: fish, nuts and seeds, canola oil, olive oil -small amounts of low fat or non fat dairy -small amounts of100 % whole grains - check the lables -drink plenty of water  AVOID: -SUGAR, sweets, anything with added sugar, corn syrup or sweeteners - must read labels as even foods advertised as "healthy" often are loaded with sugar -if you must have a sweetener, small amounts of stevia may be best -sweetened beverages and artificially sweetened beverages -simple starches (rice, bread, potatoes, pasta, chips, etc - small amounts of 100% whole grains are ok) -red meat, pork, butter -fried foods, fast food, processed food, excessive dairy, eggs and coconut.  3)Get at least 150 minutes of sweaty aerobic exercise per week.  4)Reduce stress - consider counseling, meditation and relaxation to balance other aspects of your life.            No follow-ups on file.  Lucretia Kern, DO

## 2018-08-12 ENCOUNTER — Other Ambulatory Visit: Payer: Self-pay | Admitting: Oncology

## 2018-08-12 ENCOUNTER — Ambulatory Visit
Admission: RE | Admit: 2018-08-12 | Discharge: 2018-08-12 | Disposition: A | Discharge status: Home / Self Care | Payer: BLUE CROSS/BLUE SHIELD | Source: Ambulatory Visit | Attending: Oncology | Admitting: Oncology

## 2018-08-12 DIAGNOSIS — C880 Waldenstrom macroglobulinemia: Secondary | ICD-10-CM

## 2018-08-12 DIAGNOSIS — N6310 Unspecified lump in the right breast, unspecified quadrant: Secondary | ICD-10-CM

## 2018-08-12 DIAGNOSIS — R922 Inconclusive mammogram: Secondary | ICD-10-CM | POA: Diagnosis not present

## 2018-08-12 DIAGNOSIS — C8512 Unspecified B-cell lymphoma, intrathoracic lymph nodes: Secondary | ICD-10-CM | POA: Diagnosis not present

## 2018-08-12 IMAGING — US ULTRASOUND RIGHT BREAST LIMITED
1 series · 12 of 12 positions shown · non-contrast
Comparison: Previous exam(s).

CLINICAL DATA: Followup low-grade non-Hodgkin B-cell lymphoma
diagnosed on biopsy of a cystic and solid mass in the [DATE] to 9
o'clock position of the right breast on [DATE]. She has not
undergone any treatment. She reports that she has had a diagnosis of
lymphoma for the past 3 years without treatment or progression by
blood work.

EXAM:
DIGITAL DIAGNOSTIC RIGHT MAMMOGRAM WITH CAD AND TOMO
ULTRASOUND RIGHT BREAST

[Series 1: ultrasound right breast limited · 0.05mm/px · 12 of 12 slices shown]
[im 1/12]
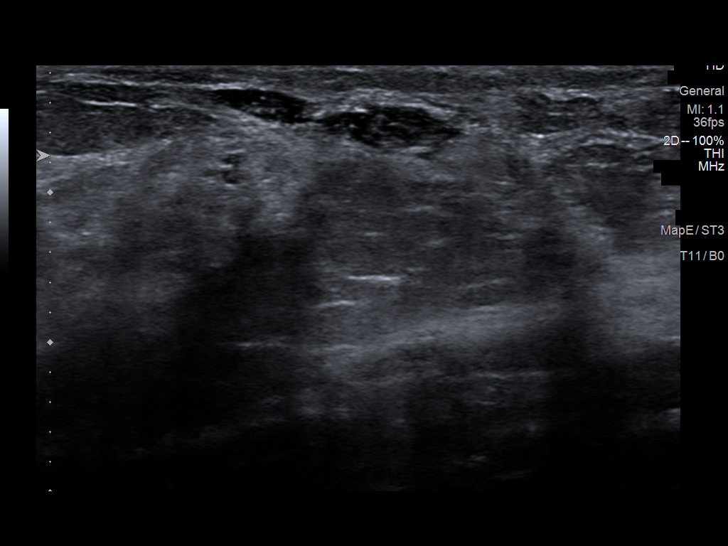
[im 2/12]
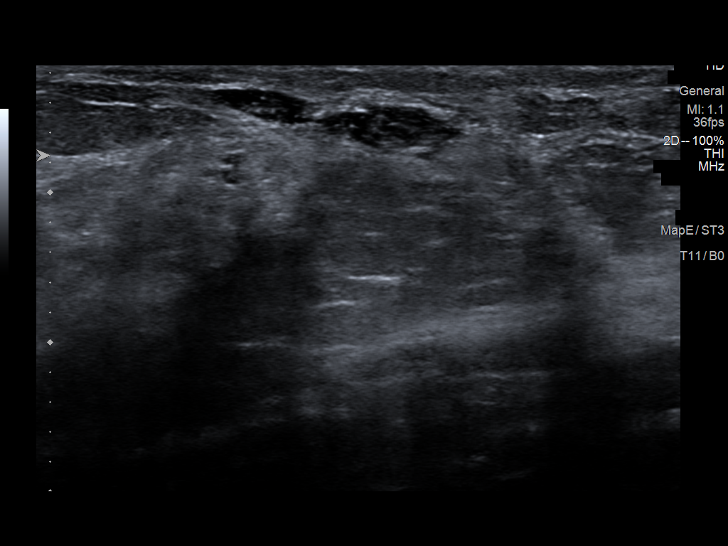
[im 3/12]
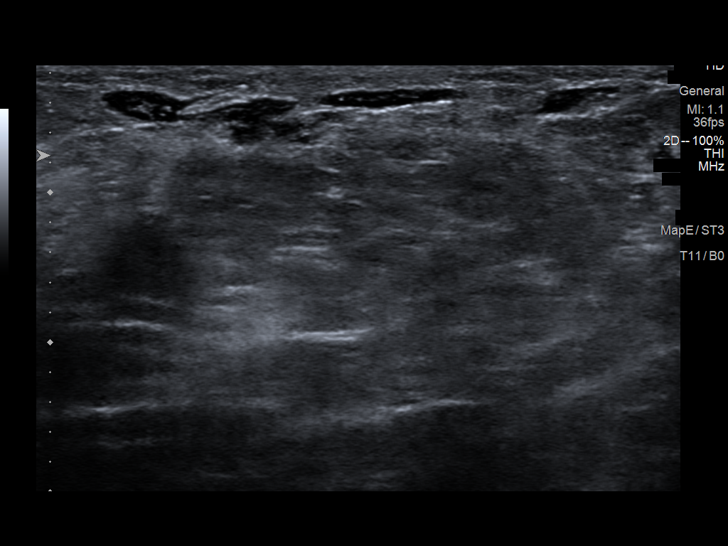
[im 4/12]
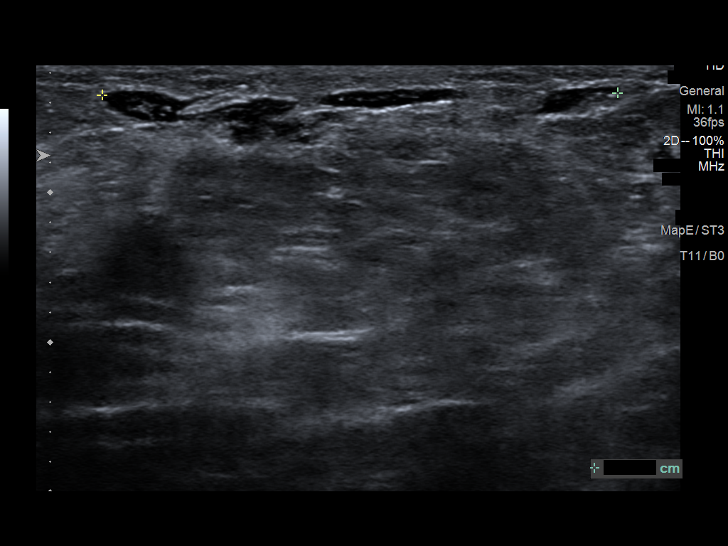
[im 5/12]
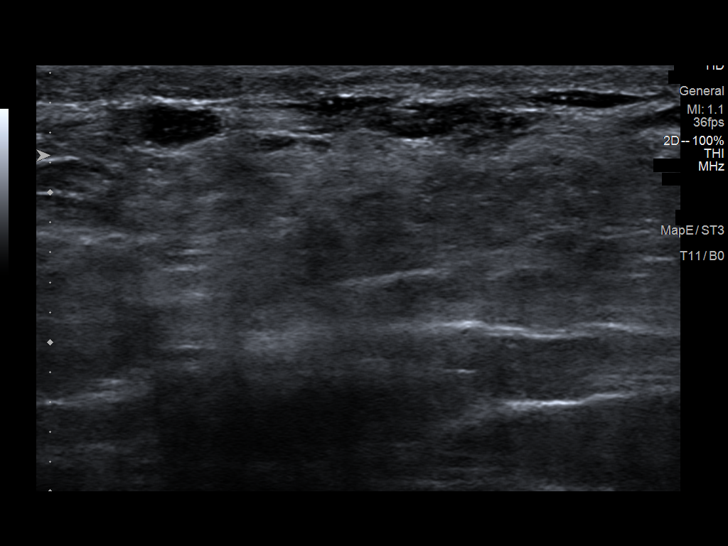
[im 6/12]
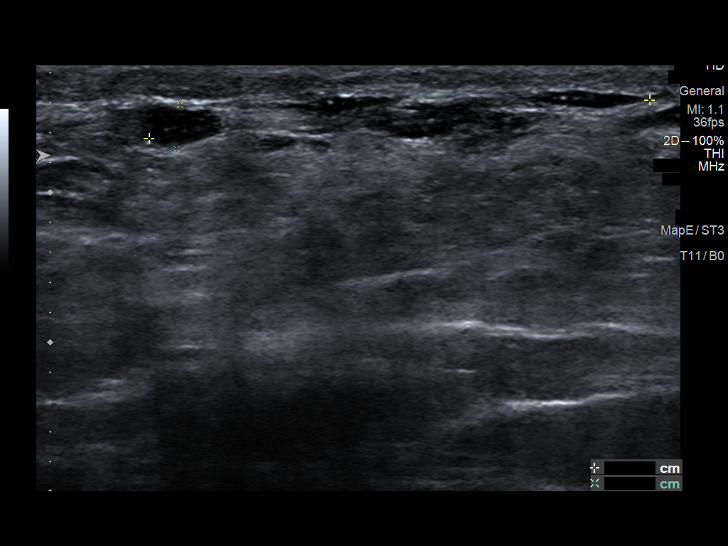
[im 7/12]
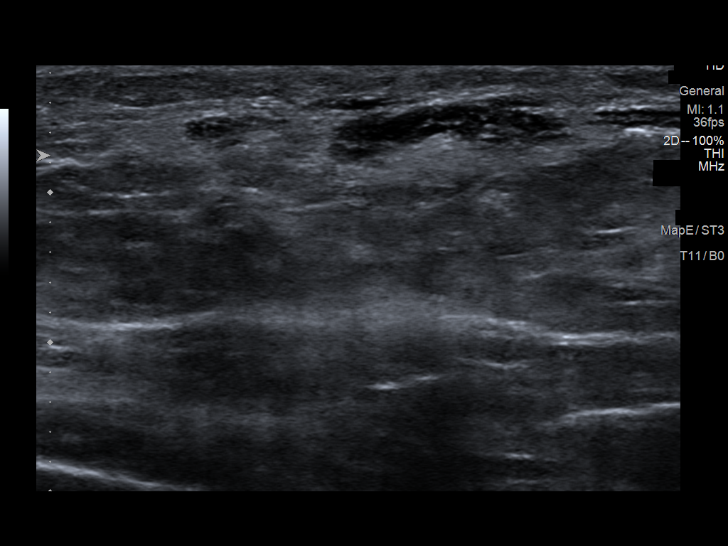
[im 8/12]
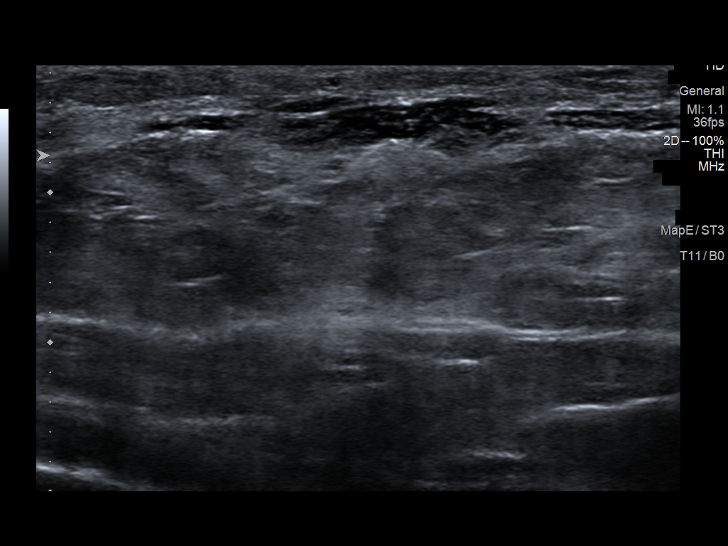
[im 9/12]
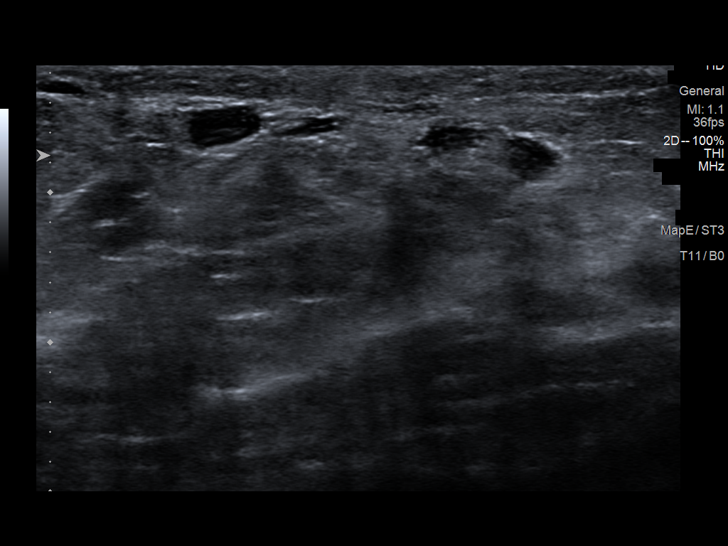
[im 10/12]
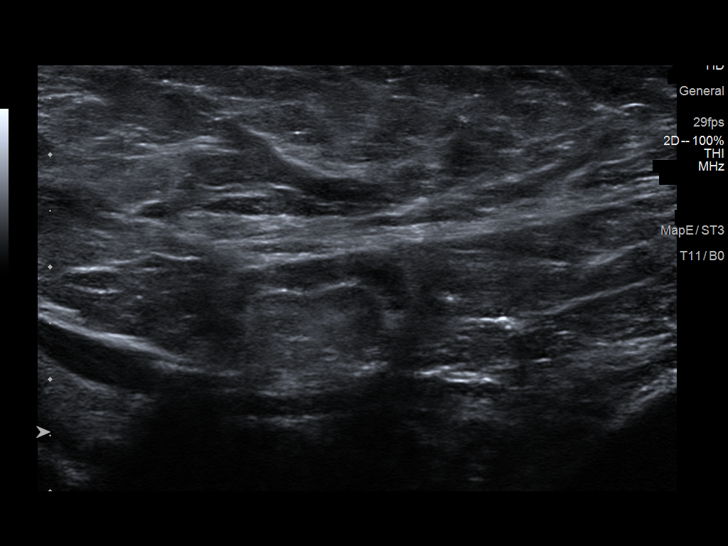
[im 11/12]
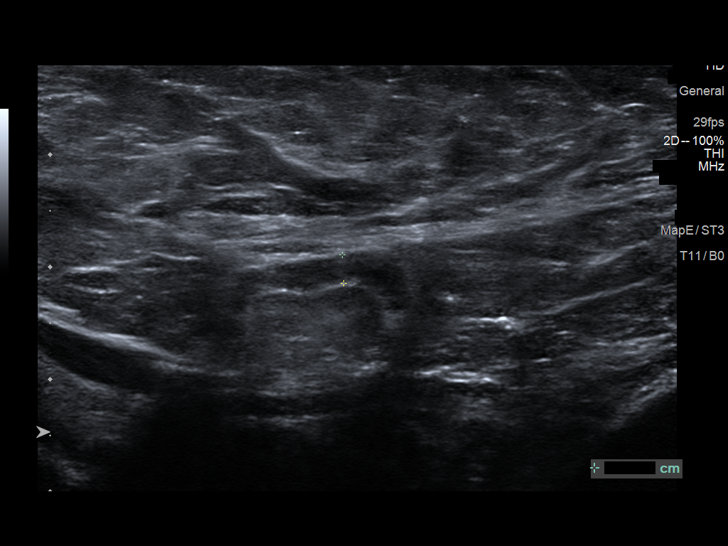
[im 12/12]
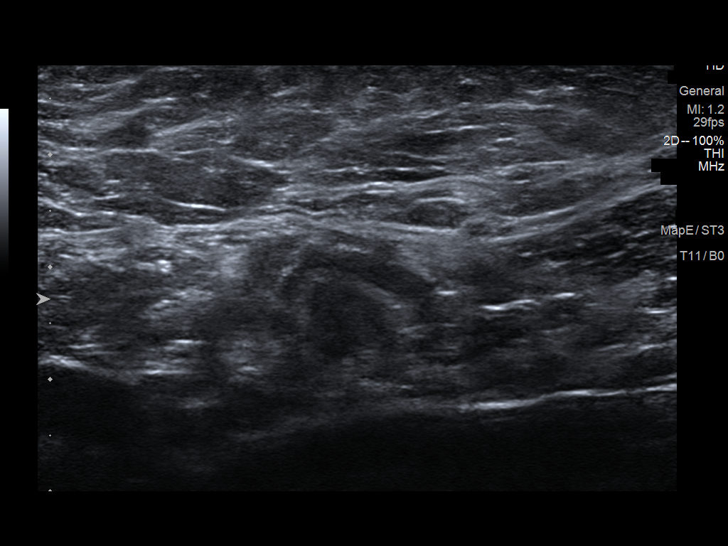

[12 of 12 positions shown; findings below may reference images not displayed]

ACR Breast Density Category c: The breast tissue is heterogeneously
dense, which may obscure small masses.
FINDINGS: A ribbon shaped biopsy marker clip is demonstrated in the lateral
aspect of the right breast at the location of the asymmetric density
seen initially on [DATE]. The asymmetric density has not changed
significantly. No interval findings elsewhere in the right breast
suspicious for malignancy.

Mammographic images were processed with CAD.

Targeted ultrasound is performed, showing a 3.4 x 3.4 x 0.3 cm area
of multiple elongated and oval hypoechoic areas in the superficial
aspect of the right breast in the 9 o'clock position, 7 cm from the
nipple. This measured 4.1 x 2.7 x 0.7 cm on [DATE], prior to
biopsy.

Ultrasound of the right axilla demonstrated normal appearing right
axillary lymph nodes.
IMPRESSION: Slight overall decrease in size of the multiple elongated and oval
hypoechoic areas in the 9 o'clock position of the right breast,
corresponding to the previously biopsied lymphoma. The slight
decrease in size is compatible with tissue removal at the time of
biopsy. No findings elsewhere in the right breast suspicious for
malignancy.

RECOMMENDATION:
Bilateral diagnostic mammogram and right breast ultrasound in 4
months. That will be 1 year since mammographic evaluation of the
left breast.

I have discussed the findings and recommendations with the patient.
Results were also provided in writing at the conclusion of the
visit. If applicable, a reminder letter will be sent to the patient
regarding the next appointment.

BI-RADS CATEGORY  6: Known biopsy-proven malignancy.

## 2018-08-12 IMAGING — MG DIGITAL DIAGNOSTIC UNILATERAL RIGHT MAMMOGRAM WITH TOMO AND CAD
4 series · 4 of 12 positions shown · non-contrast
Comparison: Previous exam(s).

CLINICAL DATA: Followup low-grade non-Hodgkin B-cell lymphoma
diagnosed on biopsy of a cystic and solid mass in the [DATE] to 9
o'clock position of the right breast on [DATE]. She has not
undergone any treatment. She reports that she has had a diagnosis of
lymphoma for the past 3 years without treatment or progression by
blood work.

EXAM:
DIGITAL DIAGNOSTIC RIGHT MAMMOGRAM WITH CAD AND TOMO
ULTRASOUND RIGHT BREAST

[R MLO synth-2D]
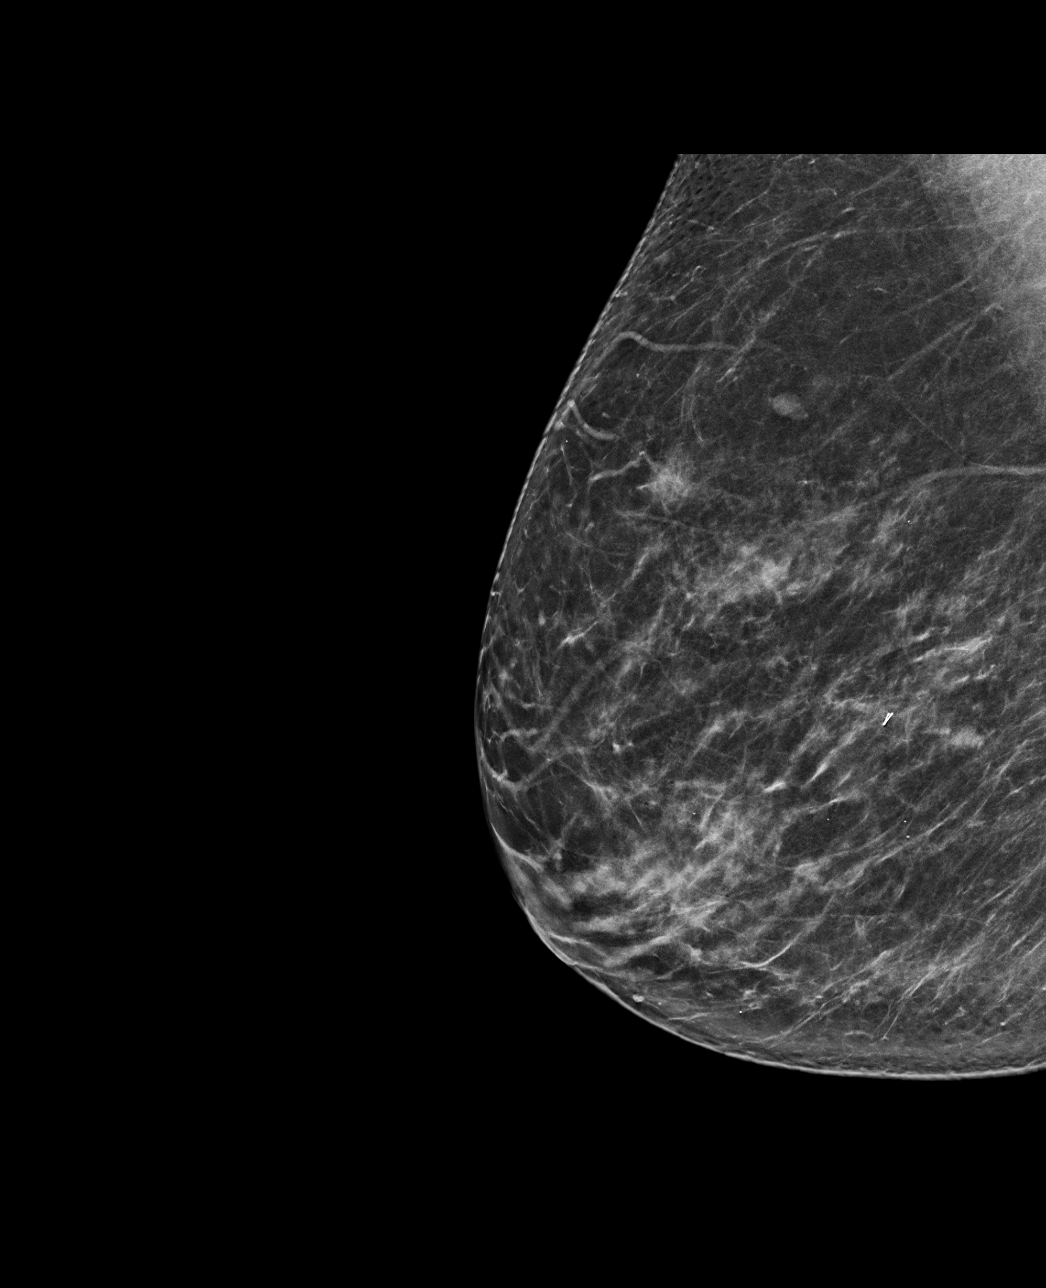

[R CC synth-2D]
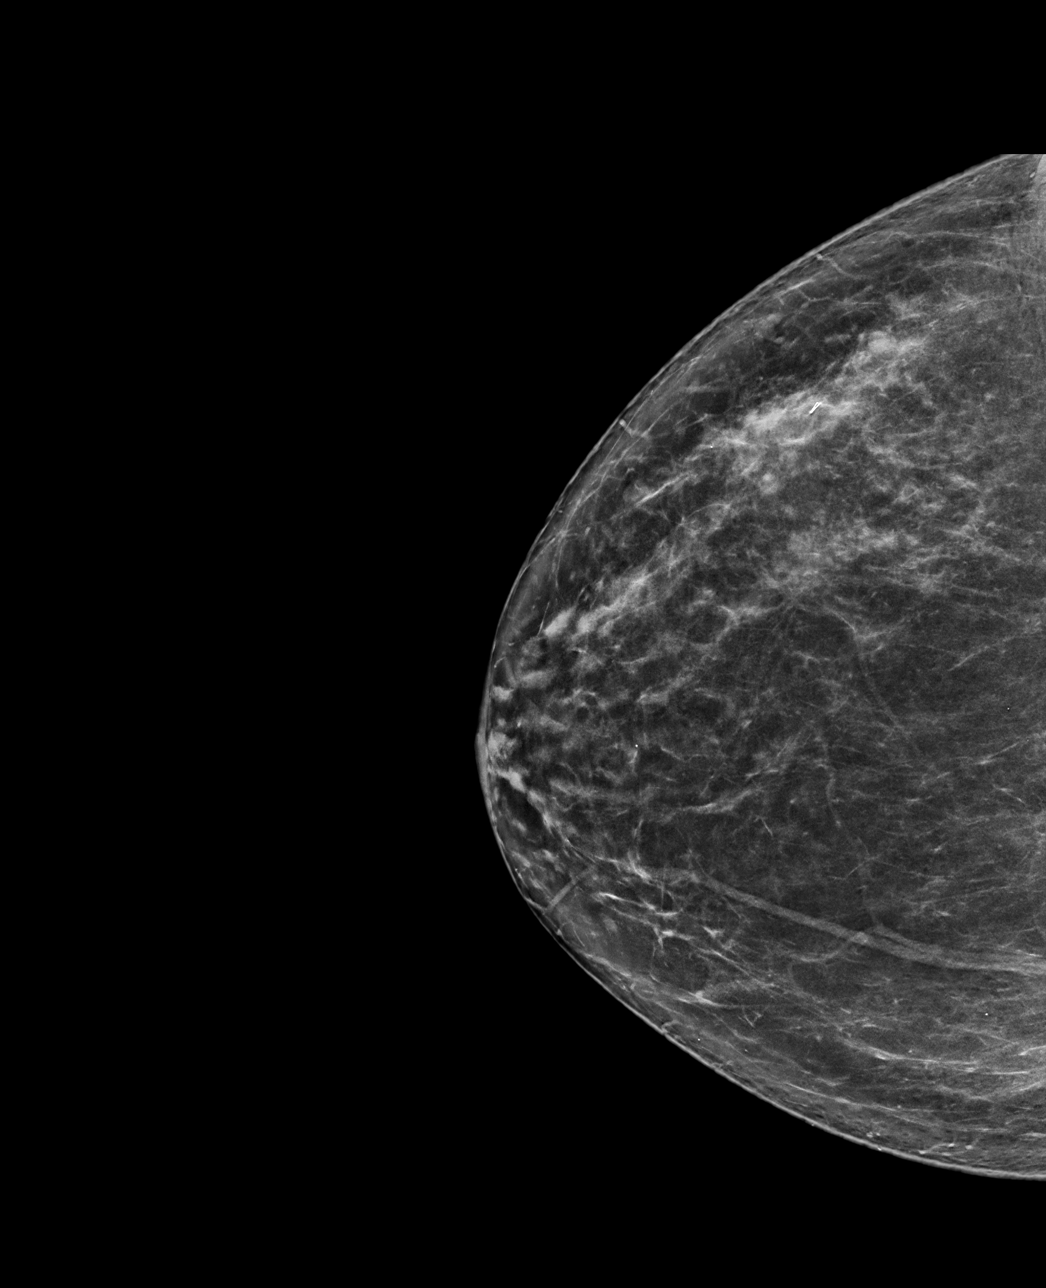

[R MLO tomo · tomo slice 39/78.0]
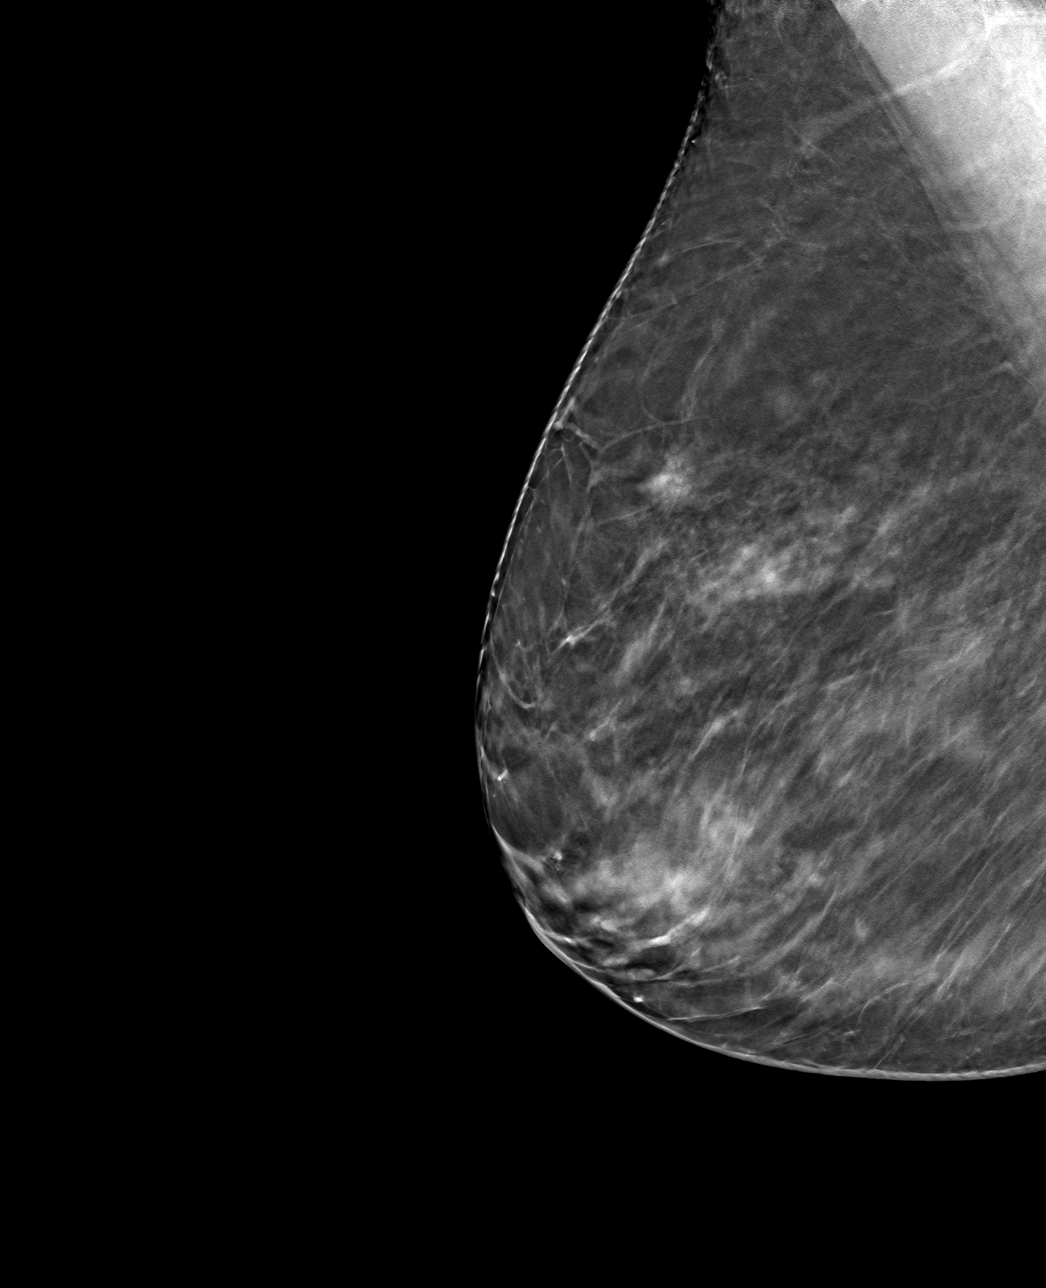

[R CC tomo · tomo slice 40/79.0]
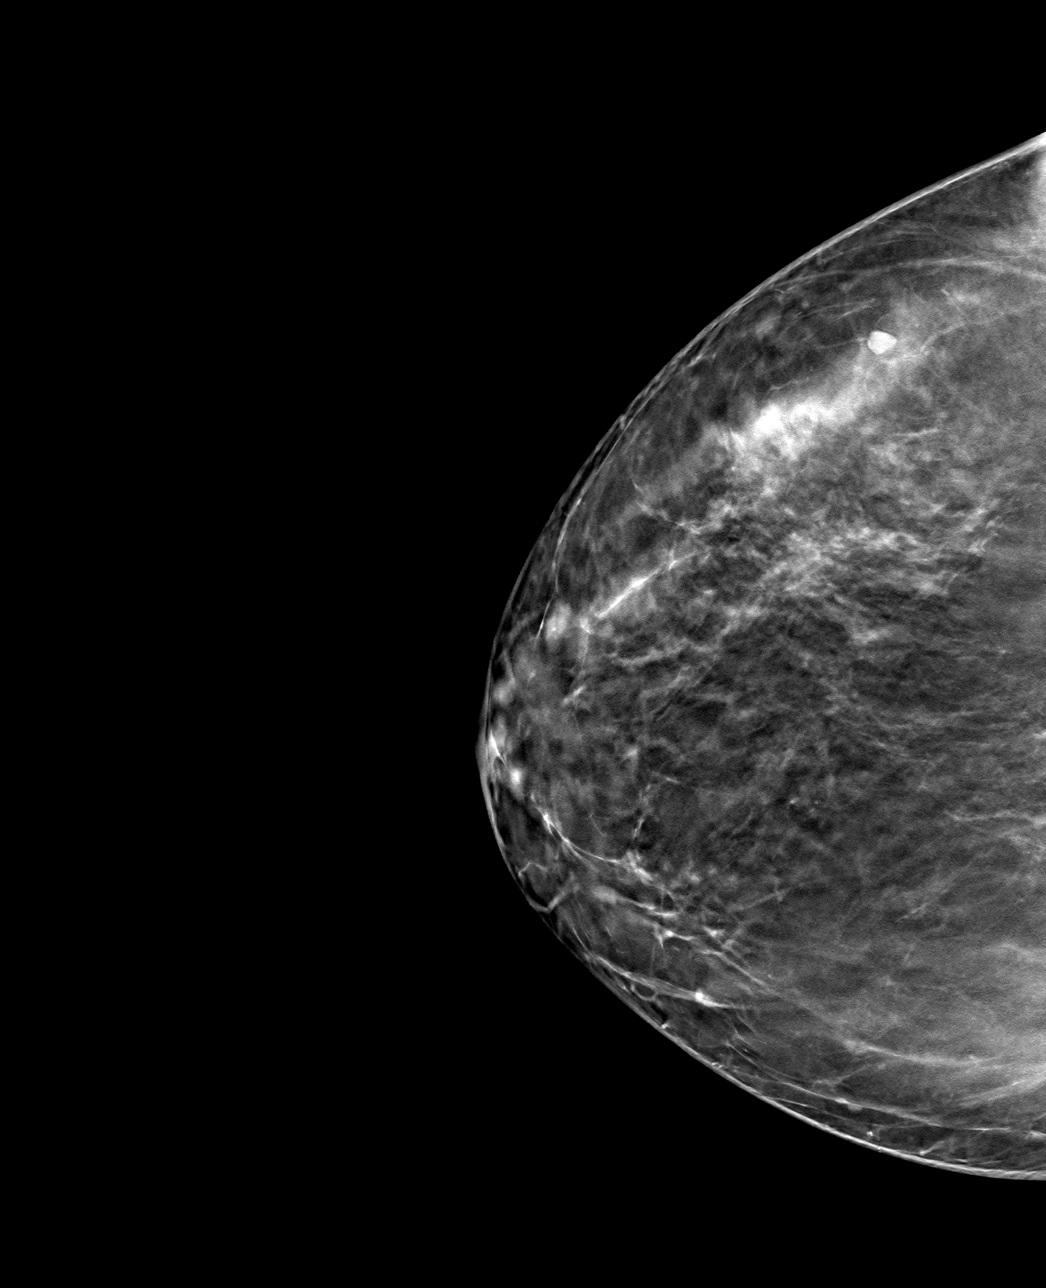

[4 of 12 positions shown; findings below may reference images not displayed]

ACR Breast Density Category c: The breast tissue is heterogeneously
dense, which may obscure small masses.
FINDINGS: A ribbon shaped biopsy marker clip is demonstrated in the lateral
aspect of the right breast at the location of the asymmetric density
seen initially on [DATE]. The asymmetric density has not changed
significantly. No interval findings elsewhere in the right breast
suspicious for malignancy.

Mammographic images were processed with CAD.

Targeted ultrasound is performed, showing a 3.4 x 3.4 x 0.3 cm area
of multiple elongated and oval hypoechoic areas in the superficial
aspect of the right breast in the 9 o'clock position, 7 cm from the
nipple. This measured 4.1 x 2.7 x 0.7 cm on [DATE], prior to
biopsy.

Ultrasound of the right axilla demonstrated normal appearing right
axillary lymph nodes.
IMPRESSION: Slight overall decrease in size of the multiple elongated and oval
hypoechoic areas in the 9 o'clock position of the right breast,
corresponding to the previously biopsied lymphoma. The slight
decrease in size is compatible with tissue removal at the time of
biopsy. No findings elsewhere in the right breast suspicious for
malignancy.

RECOMMENDATION:
Bilateral diagnostic mammogram and right breast ultrasound in 4
months. That will be 1 year since mammographic evaluation of the
left breast.

I have discussed the findings and recommendations with the patient.
Results were also provided in writing at the conclusion of the
visit. If applicable, a reminder letter will be sent to the patient
regarding the next appointment.

BI-RADS CATEGORY  6: Known biopsy-proven malignancy.

## 2018-08-13 ENCOUNTER — Encounter: Payer: Self-pay | Admitting: *Deleted

## 2018-08-13 ENCOUNTER — Other Ambulatory Visit: Payer: BLUE CROSS/BLUE SHIELD

## 2018-08-13 DIAGNOSIS — J471 Bronchiectasis with (acute) exacerbation: Secondary | ICD-10-CM | POA: Diagnosis not present

## 2018-08-13 DIAGNOSIS — J988 Other specified respiratory disorders: Secondary | ICD-10-CM | POA: Diagnosis not present

## 2018-08-13 DIAGNOSIS — J4530 Mild persistent asthma, uncomplicated: Secondary | ICD-10-CM | POA: Diagnosis not present

## 2018-08-13 DIAGNOSIS — R05 Cough: Secondary | ICD-10-CM | POA: Diagnosis not present

## 2018-08-13 LAB — HEMOGLOBIN A1C: HEMOGLOBIN A1C: 5.7 % (ref 4.6–6.5)

## 2018-08-13 LAB — LIPID PANEL
CHOL/HDL RATIO: 4
CHOLESTEROL: 210 mg/dL — AB (ref 0–200)
HDL: 56.7 mg/dL (ref >39.00)
LDL CALC: 136 mg/dL — AB (ref 0–99)
NonHDL: 153.18
Triglycerides: 88 mg/dL (ref 0.0–149.0)
VLDL: 17.6 mg/dL (ref 0.0–40.0)

## 2018-08-13 NOTE — Addendum Note (Signed)
Addended by: Elmer Picker on: 08/13/2018 11:41 AM   Modules accepted: Orders

## 2018-08-17 ENCOUNTER — Encounter: Payer: Self-pay | Admitting: *Deleted

## 2018-08-19 ENCOUNTER — Other Ambulatory Visit: Payer: BLUE CROSS/BLUE SHIELD

## 2018-08-26 DIAGNOSIS — R102 Pelvic and perineal pain: Secondary | ICD-10-CM | POA: Diagnosis not present

## 2018-08-26 DIAGNOSIS — M6281 Muscle weakness (generalized): Secondary | ICD-10-CM | POA: Diagnosis not present

## 2018-08-26 DIAGNOSIS — M62838 Other muscle spasm: Secondary | ICD-10-CM | POA: Diagnosis not present

## 2018-08-26 DIAGNOSIS — N393 Stress incontinence (female) (male): Secondary | ICD-10-CM | POA: Diagnosis not present

## 2018-09-03 ENCOUNTER — Ambulatory Visit (INDEPENDENT_AMBULATORY_CARE_PROVIDER_SITE_OTHER): Payer: BLUE CROSS/BLUE SHIELD | Admitting: Family Medicine

## 2018-09-03 ENCOUNTER — Ambulatory Visit: Payer: BLUE CROSS/BLUE SHIELD

## 2018-09-03 DIAGNOSIS — Z23 Encounter for immunization: Secondary | ICD-10-CM | POA: Diagnosis not present

## 2018-09-29 DIAGNOSIS — C880 Waldenstrom macroglobulinemia: Secondary | ICD-10-CM | POA: Diagnosis not present

## 2018-10-08 DIAGNOSIS — N3942 Incontinence without sensory awareness: Secondary | ICD-10-CM | POA: Diagnosis not present

## 2018-10-08 DIAGNOSIS — N393 Stress incontinence (female) (male): Secondary | ICD-10-CM | POA: Diagnosis not present

## 2018-10-13 DIAGNOSIS — M6281 Muscle weakness (generalized): Secondary | ICD-10-CM | POA: Diagnosis not present

## 2018-10-13 DIAGNOSIS — N393 Stress incontinence (female) (male): Secondary | ICD-10-CM | POA: Diagnosis not present

## 2018-10-13 DIAGNOSIS — M62838 Other muscle spasm: Secondary | ICD-10-CM | POA: Diagnosis not present

## 2018-10-13 DIAGNOSIS — R102 Pelvic and perineal pain: Secondary | ICD-10-CM | POA: Diagnosis not present

## 2018-10-28 DIAGNOSIS — N393 Stress incontinence (female) (male): Secondary | ICD-10-CM | POA: Diagnosis not present

## 2018-10-28 DIAGNOSIS — R102 Pelvic and perineal pain: Secondary | ICD-10-CM | POA: Diagnosis not present

## 2018-10-28 DIAGNOSIS — M6281 Muscle weakness (generalized): Secondary | ICD-10-CM | POA: Diagnosis not present

## 2018-10-28 DIAGNOSIS — M62838 Other muscle spasm: Secondary | ICD-10-CM | POA: Diagnosis not present

## 2018-11-03 ENCOUNTER — Ambulatory Visit (INDEPENDENT_AMBULATORY_CARE_PROVIDER_SITE_OTHER): Payer: BLUE CROSS/BLUE SHIELD

## 2018-11-03 DIAGNOSIS — Z23 Encounter for immunization: Secondary | ICD-10-CM

## 2018-11-03 NOTE — Progress Notes (Signed)
Per orders of Dr. Maudie Mercury, injection of Shingrix given by Rebecca Eaton. Patient tolerated injection well.

## 2018-12-02 DIAGNOSIS — Z6826 Body mass index (BMI) 26.0-26.9, adult: Secondary | ICD-10-CM | POA: Diagnosis not present

## 2018-12-02 DIAGNOSIS — Z01419 Encounter for gynecological examination (general) (routine) without abnormal findings: Secondary | ICD-10-CM | POA: Diagnosis not present

## 2018-12-09 ENCOUNTER — Other Ambulatory Visit: Payer: Self-pay | Admitting: Obstetrics and Gynecology

## 2018-12-09 DIAGNOSIS — N6310 Unspecified lump in the right breast, unspecified quadrant: Secondary | ICD-10-CM

## 2018-12-10 ENCOUNTER — Ambulatory Visit
Admission: RE | Admit: 2018-12-10 | Discharge: 2018-12-10 | Disposition: A | Discharge status: Home / Self Care | Payer: BLUE CROSS/BLUE SHIELD | Source: Ambulatory Visit | Attending: Oncology | Admitting: Oncology

## 2018-12-10 DIAGNOSIS — R922 Inconclusive mammogram: Secondary | ICD-10-CM | POA: Diagnosis not present

## 2018-12-10 DIAGNOSIS — N6310 Unspecified lump in the right breast, unspecified quadrant: Secondary | ICD-10-CM

## 2018-12-10 DIAGNOSIS — N6489 Other specified disorders of breast: Secondary | ICD-10-CM | POA: Diagnosis not present

## 2018-12-10 IMAGING — MG DIGITAL DIAGNOSTIC BILATERAL MAMMOGRAM WITH TOMO AND CAD
8 series · 8 of 24 positions shown · non-contrast
Comparison: Previous exam(s).

CLINICAL DATA: 67-year-old female with history of non-Hodgkin's
B-cell lymphoma. The patient had a right breast biopsy in [OF]
demonstrating lymphoma, for which she is being treated clinically.

EXAM:
DIGITAL DIAGNOSTIC BILATERAL MAMMOGRAM WITH CAD AND TOMO
ULTRASOUND RIGHT BREAST

[L MLO synth-2D]
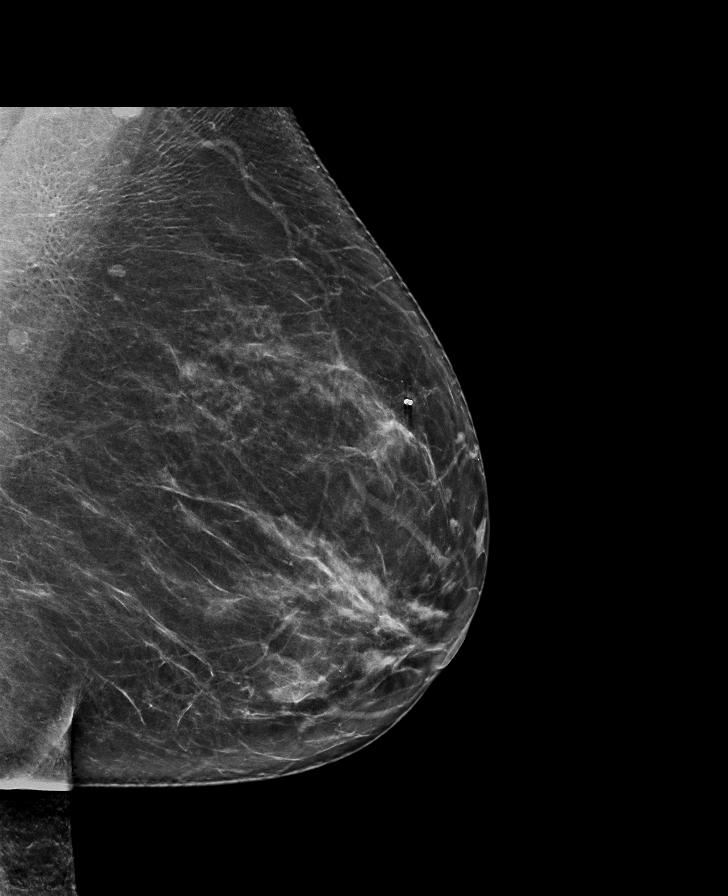

[L CC synth-2D]
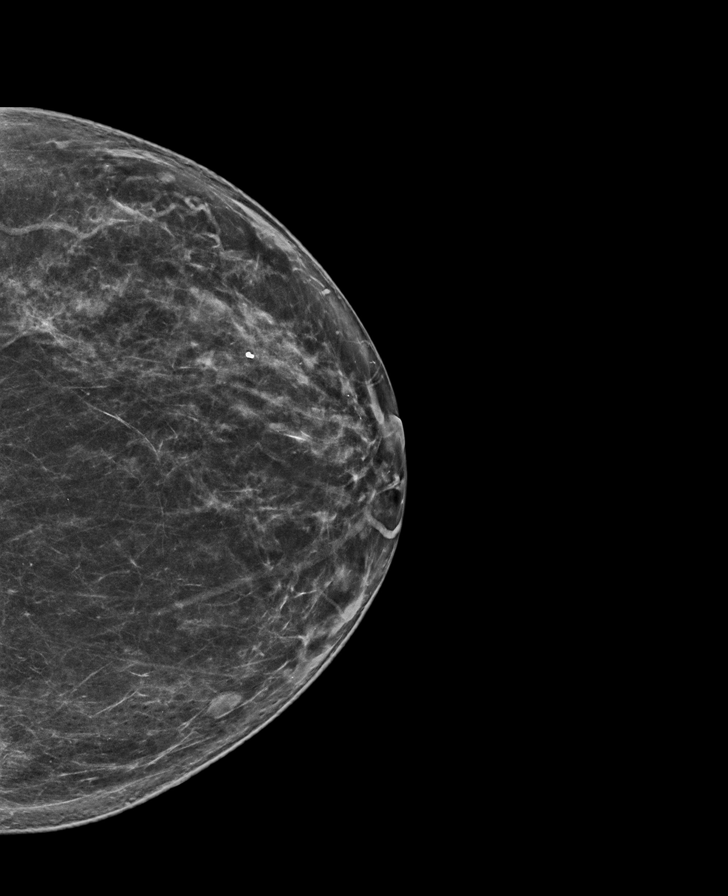

[R CC synth-2D]
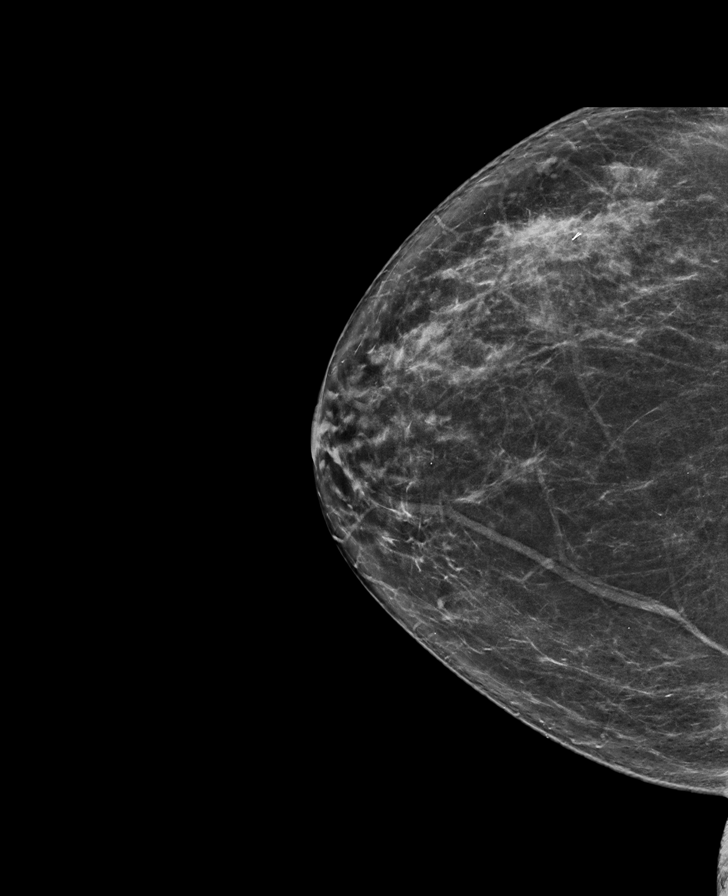

[R MLO synth-2D]
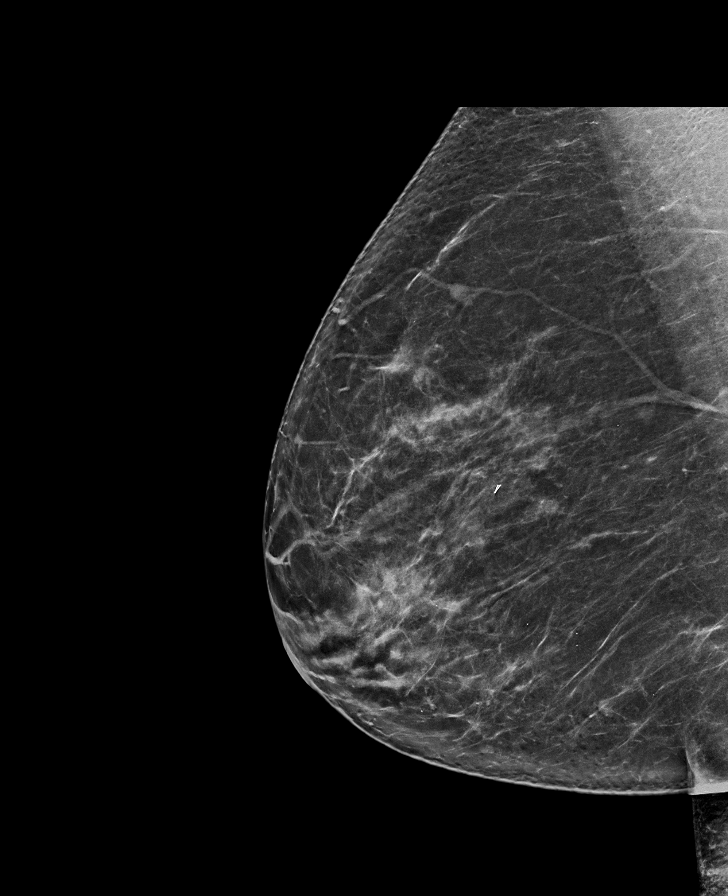

[L MLO tomo · tomo slice 43/86.0]
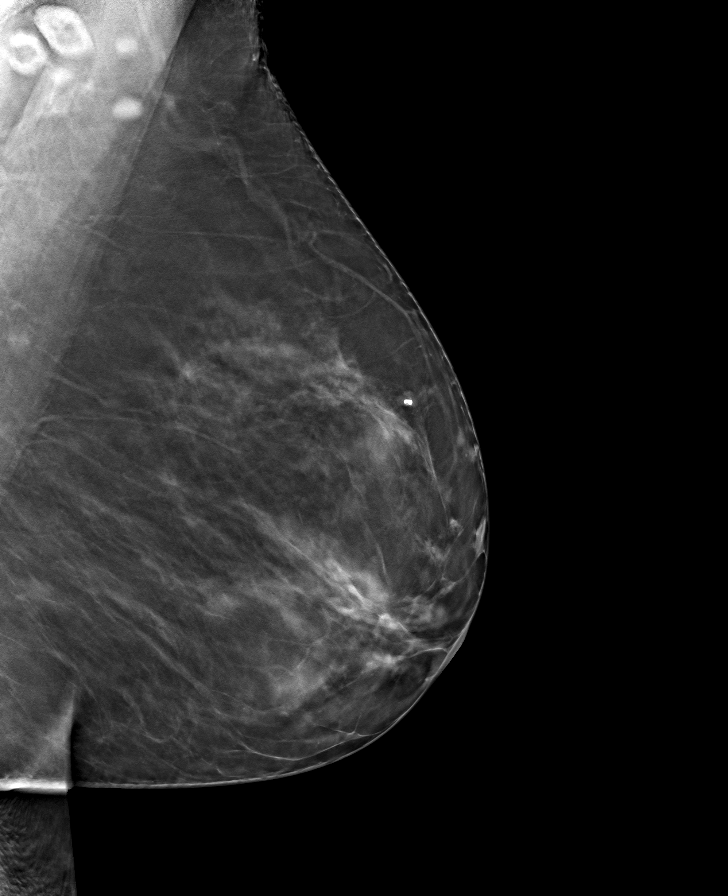

[R MLO tomo · tomo slice 43/84.0]
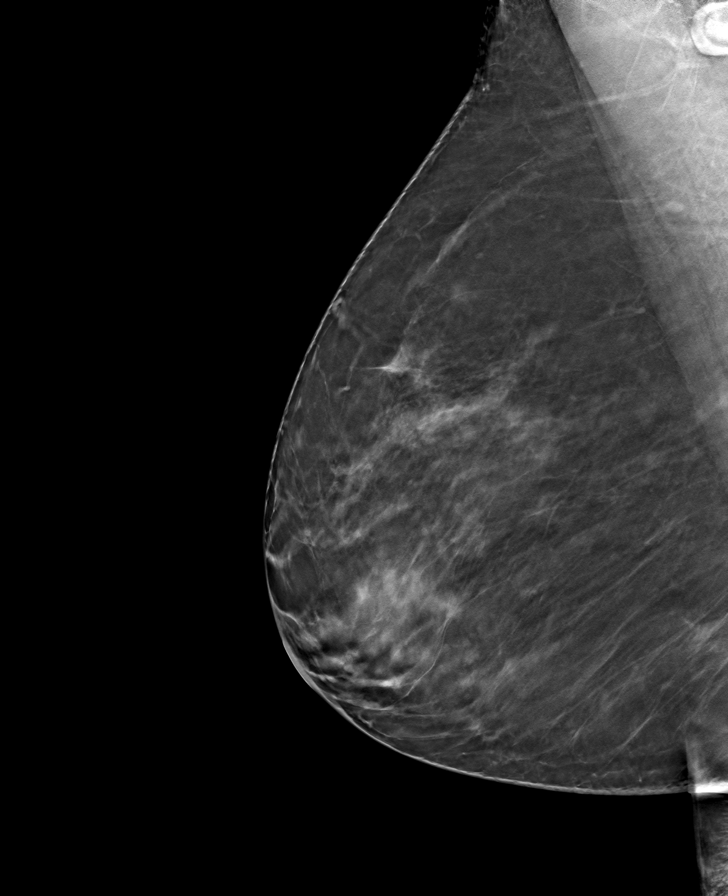

[R CC tomo · tomo slice 41/80.0]
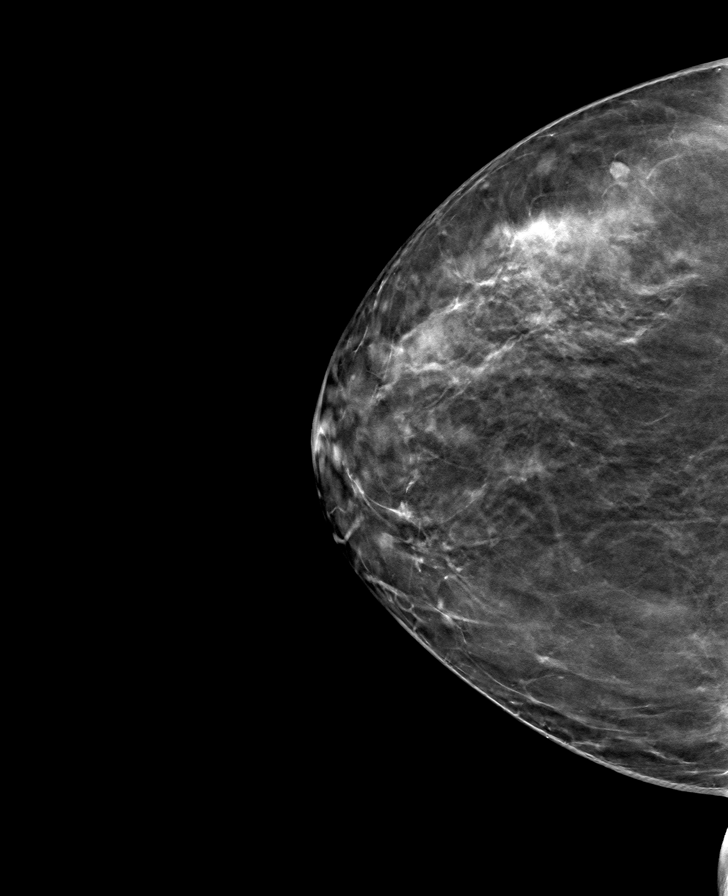

[L CC tomo · tomo slice 39/78.0]
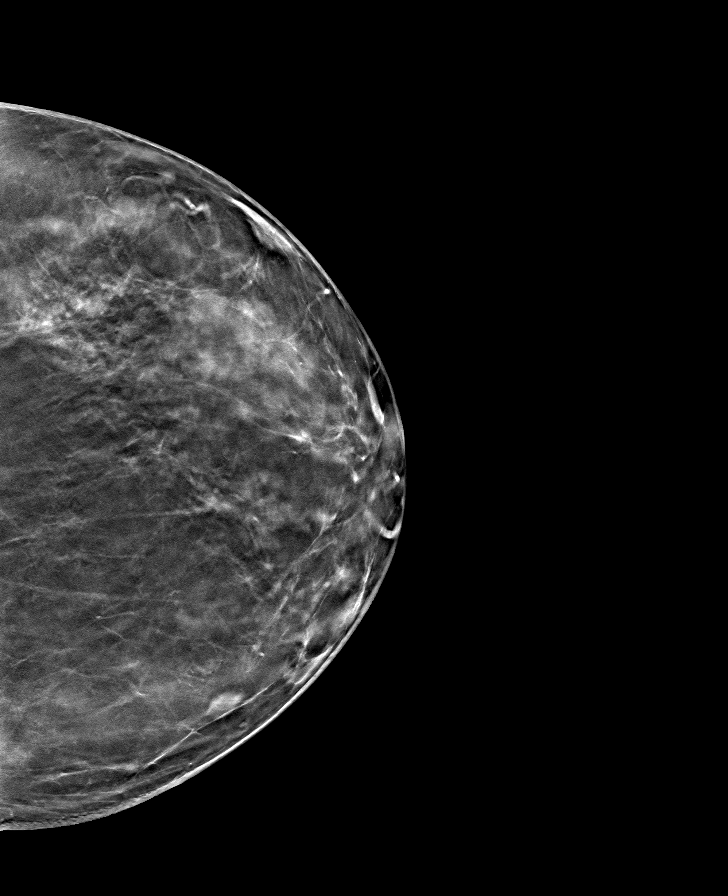

[8 of 24 positions shown; findings below may reference images not displayed]

ACR Breast Density Category c: The breast tissue is heterogeneously
dense, which may obscure small masses.
FINDINGS: Post biopsy clip with scattered associated circumscribed masses
demonstrated in the central lateral right breast. The mammographic
appearance is stable. No new or suspicious mammographic findings are
identified in either breast.

Mammographic images were processed with CAD.

Targeted ultrasound is performed, showing grossly stable appearance
of multiple circumscribed hypoechoic masses scattered throughout the
lateral right breast. Exact measurements are difficult to obtain due
to the diffuse appearance. However overall measurements today are
4.4 x 2.1 x 0.4 cm (previously 3.4 x 3.4 x 0.3 cm). Variations may
be due to positioning and technique as the mammographic appearance
is stable.
IMPRESSION: 1. Stable appearance of scattered right breast masses consistent
with the patient's biopsy-proven non-Hodgkin's lymphoma.
2. Otherwise no mammographic evidence of malignancy in either
breast.

RECOMMENDATION:
Recommendation is for clinical follow-up for the patient's known
Hodgkin's lymphoma. The patient would be due for annual bilateral
mammographic screening in 1 year. Additional imaging may be obtained
sooner if clinically needed.

I have discussed the findings and recommendations with the patient.
Results were also provided in writing at the conclusion of the
visit. If applicable, a reminder letter will be sent to the patient
regarding the next appointment.

BI-RADS CATEGORY  2: Benign.  No evidence of breast cancer.

## 2018-12-10 IMAGING — US ULTRASOUND RIGHT BREAST LIMITED
1 series · 7 of 7 positions shown · non-contrast
Comparison: Previous exam(s).

CLINICAL DATA: 67-year-old female with history of non-Hodgkin's
B-cell lymphoma. The patient had a right breast biopsy in [OF]
demonstrating lymphoma, for which she is being treated clinically.

EXAM:
DIGITAL DIAGNOSTIC BILATERAL MAMMOGRAM WITH CAD AND TOMO
ULTRASOUND RIGHT BREAST

[Series 1: ultrasound right breast limited · 0.06mm/px · 7 of 7 slices shown]
[im 1/7]
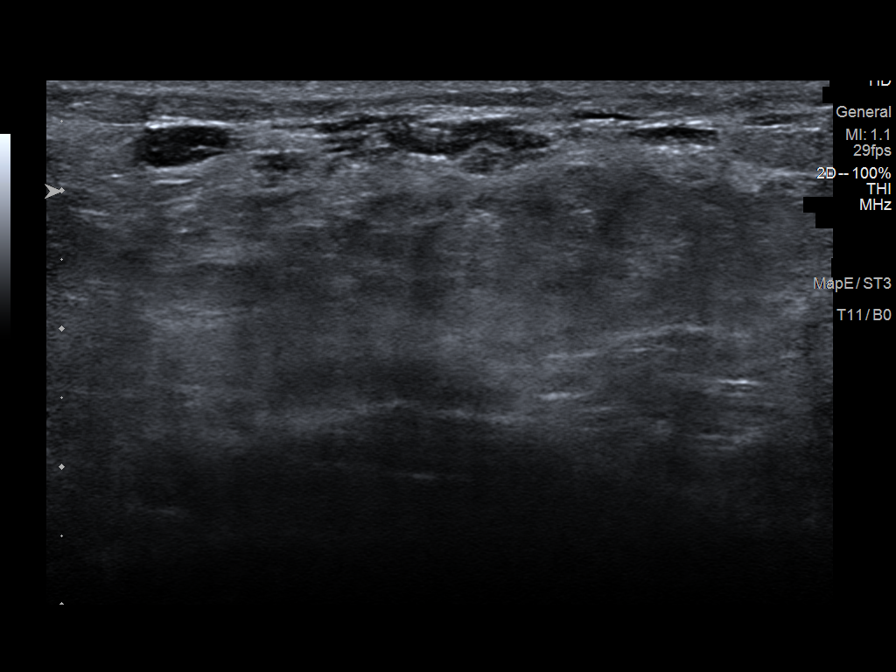
[im 2/7]
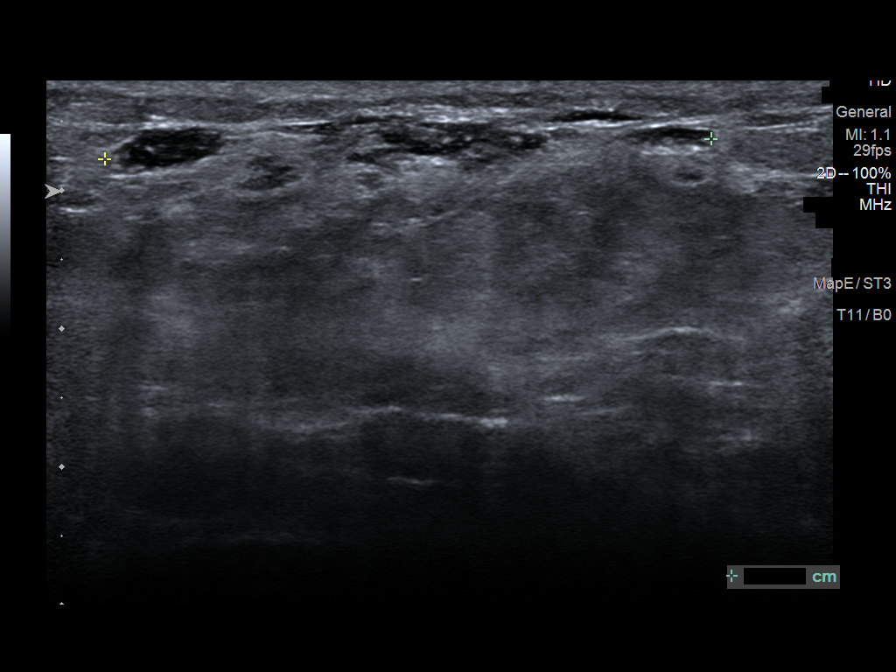
[im 3/7]
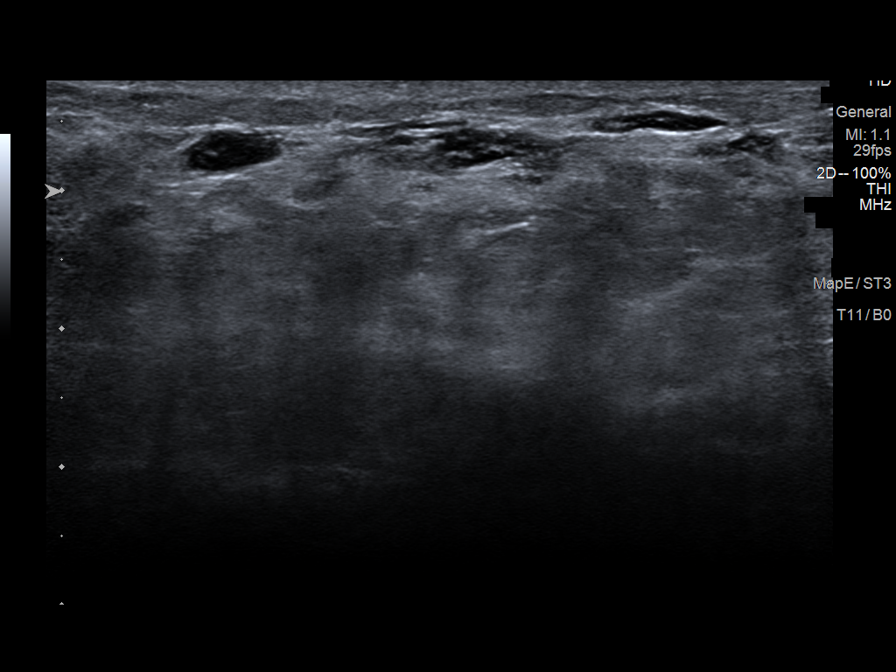
[im 4/7]
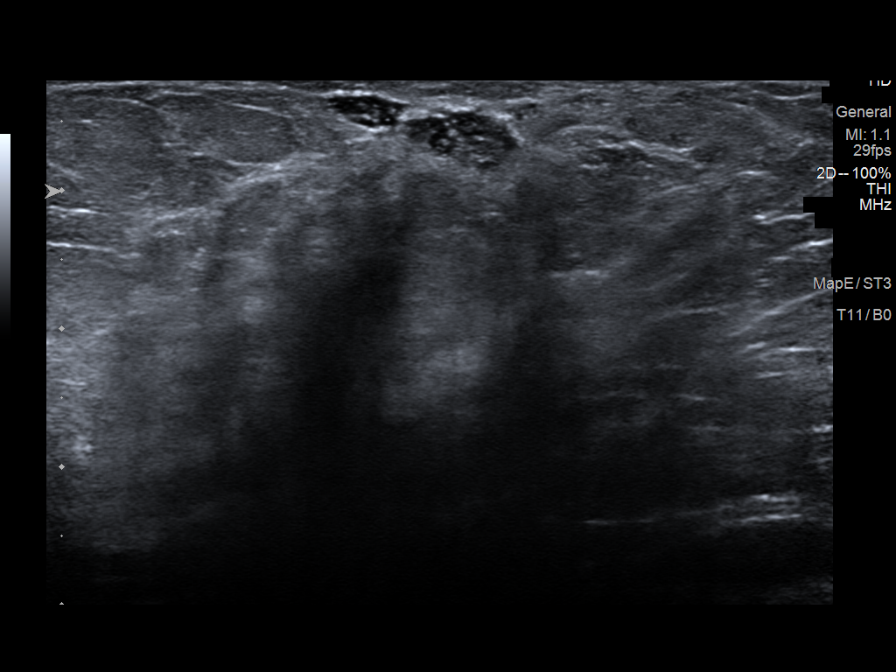
[im 5/7]
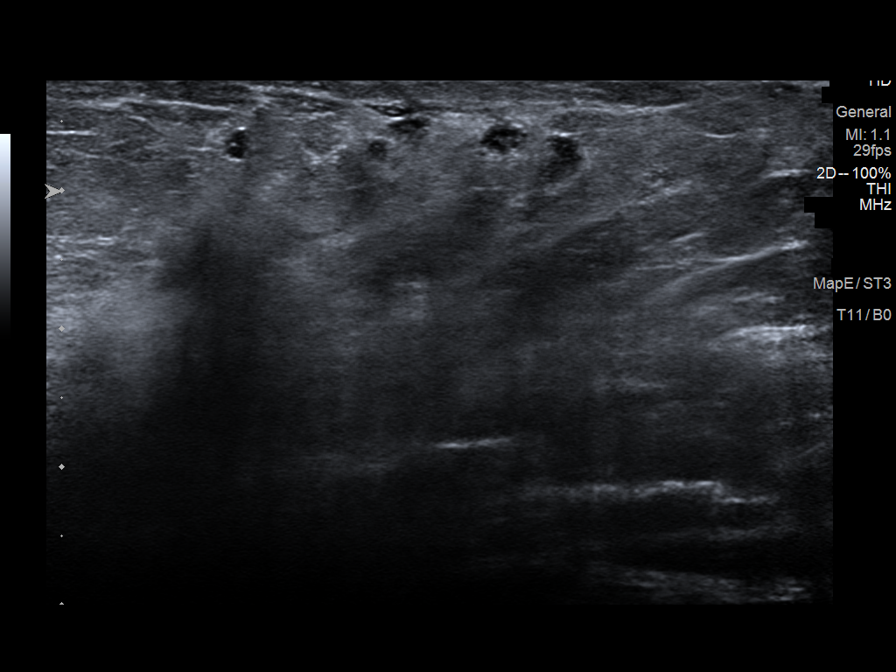
[im 6/7]
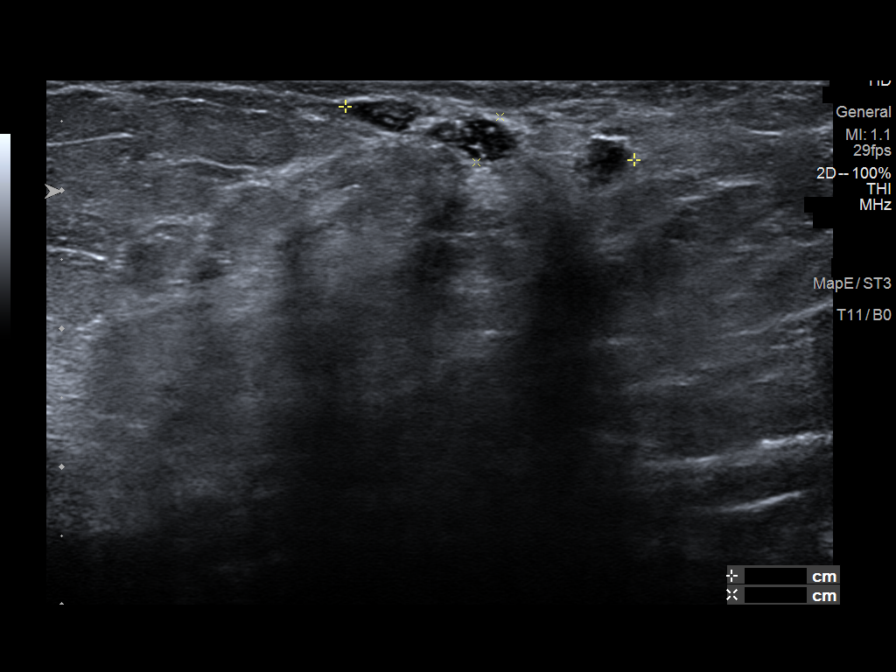
[im 7/7]
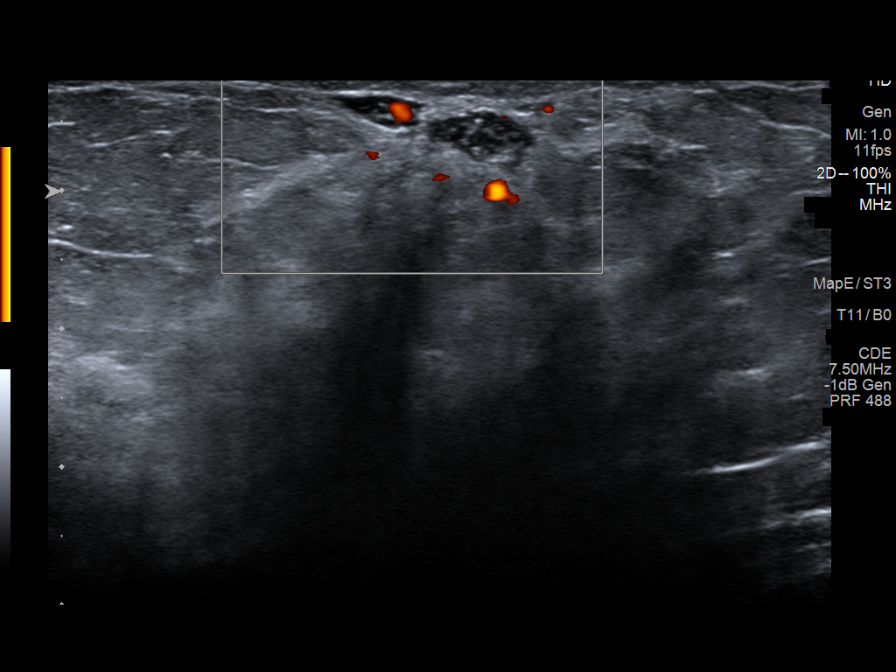

[7 of 7 positions shown; findings below may reference images not displayed]

ACR Breast Density Category c: The breast tissue is heterogeneously
dense, which may obscure small masses.
FINDINGS: Post biopsy clip with scattered associated circumscribed masses
demonstrated in the central lateral right breast. The mammographic
appearance is stable. No new or suspicious mammographic findings are
identified in either breast.

Mammographic images were processed with CAD.

Targeted ultrasound is performed, showing grossly stable appearance
of multiple circumscribed hypoechoic masses scattered throughout the
lateral right breast. Exact measurements are difficult to obtain due
to the diffuse appearance. However overall measurements today are
4.4 x 2.1 x 0.4 cm (previously 3.4 x 3.4 x 0.3 cm). Variations may
be due to positioning and technique as the mammographic appearance
is stable.
IMPRESSION: 1. Stable appearance of scattered right breast masses consistent
with the patient's biopsy-proven non-Hodgkin's lymphoma.
2. Otherwise no mammographic evidence of malignancy in either
breast.

RECOMMENDATION:
Recommendation is for clinical follow-up for the patient's known
Hodgkin's lymphoma. The patient would be due for annual bilateral
mammographic screening in 1 year. Additional imaging may be obtained
sooner if clinically needed.

I have discussed the findings and recommendations with the patient.
Results were also provided in writing at the conclusion of the
visit. If applicable, a reminder letter will be sent to the patient
regarding the next appointment.

BI-RADS CATEGORY  2: Benign.  No evidence of breast cancer.

## 2018-12-16 DIAGNOSIS — C9110 Chronic lymphocytic leukemia of B-cell type not having achieved remission: Secondary | ICD-10-CM | POA: Diagnosis not present

## 2018-12-16 DIAGNOSIS — C880 Waldenstrom macroglobulinemia: Secondary | ICD-10-CM | POA: Diagnosis not present

## 2018-12-16 NOTE — Progress Notes (Signed)
HPI:  Using dictation device. Unfortunately this device frequently misinterprets words/phrases.  Paula [name] is a pleasant 67 y.o. here for follow up. Chronic medical problems summarized below were reviewed for changes and stability and were updated as needed below. These issues and their treatment remain stable for the most part. Has not made changes in diet - still  "not so good" but still getting regular exercise. Had breast center follow up and told ok and can recheck in 1 year. Continues to do labs with oncology q 3 months. Few skin lesions on chest and will see her dermatologist in 3 weeks. Denies CP, SOB, DOE, treatment intolerance or new symptoms. Due for flu vaccine, ? Recheck lipids  Hyperlipidemia/Hyperglycemia: -lifestyle changes advised  CLL: -sees oncologist in [place] for management  Waldenstrom's macroglobulinemia -seeing Paula [name], in [facility] for management  Chronic cough: -sees specialist for management  Osteopenia: -sees Paula [name], [facility] for monitoring/management   ROS: See pertinent positives and negatives per HPI.  Past Medical History:  Diagnosis Date  . CARPAL TUNNEL SYNDROME, MILD 12/05/2008  . Chronic cough - seeing pulmonologist in [place], [facility] 03/14/2014  . HSV 03/01/2010  . Leg skin lesion, left 01/30/2015   precancerous cells per patient  . OSTEOPENIA 12/05/2008  . PARESTHESIA 09/01/2009  . Waldenstroms macroglobulinemia - followed by Paula [name], [place], [place] 03/15/2014    Past Surgical History:  Procedure Laterality Date  . CHOLECYSTECTOMY  02/2017  . LEG SKIN LESION  BIOPSY / EXCISION Left 01/30/2015   Dr Paula [name]-precancerous cells per patient  . ORIF TIBIA & FIBULA FRACTURES Right 1988  . TONSILLECTOMY AND ADENOIDECTOMY  1958    Family History  Problem Relation Age of Onset  . Hypertension Mother   . Hypertension Brother   . Colon cancer Father 23  . Hypertension Son    . Cancer Neg Hx        lung ca - mother's side  . Arthritis Neg Hx        osteo - mother's side  . Breast cancer Neg Hx     SOCIAL HX:    Current Outpatient Medications:  .  albuterol (PROVENTIL HFA;VENTOLIN HFA) 108 (90 Base) MCG/ACT inhaler, Inhale 2 puffs into the lungs every 6 (six) hours as needed., Disp: 1 Inhaler, Rfl: 0  EXAM:  Vitals:   12/17/18 1057  BP: 130/80  Pulse: 64  Temp: (!) 97.5 F (36.4 C)    Body mass index is 27.29 kg/m.  GENERAL: vitals reviewed and listed above, alert, oriented, appears well hydrated and in no acute distress  HEENT: atraumatic, conjunttiva clear, no obvious abnormalities on inspection of external nose and ears  NECK: no obvious masses on inspection  LUNGS: clear to auscultation bilaterally, no wheezes, rales or rhonchi, good air movement  CV: HRRR, no peripheral edema  SKIN: several stuck on waxy light brown macules on the chest  MS: moves all extremities without noticeable abnormality  PSYCH: pleasant and cooperative, no obvious depression or anxiety  ASSESSMENT AND PLAN:  Discussed the following assessment and plan:  Hyperlipidemia, unspecified hyperlipidemia type - Plan: Lipid panel  Hyperglycemia  Elevated blood pressure reading  Waldenstroms macroglobulinemia - followed by Paula [name], [place], [place] (chronic lymphocytic leukemia) ([place])  -labs per orders -discussed tx of hyperlipidemia, htn, hyperglycemia and she would prefer to treat with lifestyle changes if possible - recommended the Mediterranean diet and regular aerobic exercise -cont management/survaillence CLL and [employer]  with specialist -see derm as planned - appears to be SKs -follow up or CPE in 3-4 months -Patient advised to return or notify a doctor immediately if symptoms worsen or persist or new concerns arise.  Patient Instructions  BEFORE YOU LEAVE: -labs -follow up:  4 months  Start the [place]. Get at  least 150 minutes of aerobic exercise per week.  We have ordered labs or studies at this visit. It can take up to 1-2 weeks for results and processing. IF results require follow up or explanation, we will call you with instructions. Clinically stable results will be released to your [facility]. If you have not heard from [place] or cannot find your results in [facility] in 2 weeks please contact our office at [phone].  If you are not yet signed up for [facility], please consider signing up.   We recommend the following healthy lifestyle for LIFE: 1) Small portions. But, make sure to get regular (at least 3 per day), healthy meals and small healthy snacks if needed.  2) Eat a healthy clean diet.   TRY TO EAT: -at least 5-7 servings of low sugar, colorful, and nutrient rich vegetables per day (not corn, potatoes or bananas.) -berries are the best choice if you wish to eat fruit (only eat small amounts if trying to reduce weight)  -lean meets (fish, white meat of chicken or [place]) -vegan proteins for some meals - beans or tofu, whole grains, nuts and seeds -Replace bad fats with good fats - good fats include: fish, nuts and seeds, canola oil, olive oil -small amounts of low fat or non fat dairy -small amounts of100 % whole grains - check the lables -drink plenty of water  AVOID: -SUGAR, sweets, anything with added sugar, corn syrup or sweeteners - must read labels as even foods advertised as "healthy" often are loaded with sugar -if you must have a sweetener, small amounts of stevia may be best -sweetened beverages and artificially sweetened beverages -simple starches (rice, bread, potatoes, pasta, chips, etc - small amounts of 100% whole grains are ok) -red meat, pork, butter -fried foods, fast food, processed food, excessive dairy, eggs and coconut.  3)Get at least 150 minutes of sweaty aerobic exercise per week.  4)Reduce stress - consider counseling, meditation and relaxation to balance other  aspects of your life.          Paula Kern, DO

## 2018-12-17 ENCOUNTER — Ambulatory Visit: Payer: BLUE CROSS/BLUE SHIELD | Admitting: Family Medicine

## 2018-12-17 ENCOUNTER — Encounter: Payer: Self-pay | Admitting: *Deleted

## 2018-12-17 ENCOUNTER — Encounter: Payer: Self-pay | Admitting: Family Medicine

## 2018-12-17 VITALS — BP 130/80 | HR 64 | Temp 97.5°F | Ht 64.5 in

## 2018-12-17 DIAGNOSIS — E785 Hyperlipidemia, unspecified: Secondary | ICD-10-CM

## 2018-12-17 DIAGNOSIS — R739 Hyperglycemia, unspecified: Secondary | ICD-10-CM

## 2018-12-17 DIAGNOSIS — C880 Waldenstrom macroglobulinemia: Secondary | ICD-10-CM | POA: Diagnosis not present

## 2018-12-17 DIAGNOSIS — R030 Elevated blood-pressure reading, without diagnosis of hypertension: Secondary | ICD-10-CM

## 2018-12-17 DIAGNOSIS — C9110 Chronic lymphocytic leukemia of B-cell type not having achieved remission: Secondary | ICD-10-CM

## 2018-12-17 LAB — LIPID PANEL
CHOLESTEROL: 181 mg/dL (ref 0–200)
HDL: 54 mg/dL (ref >39.00)
LDL CALC: 113 mg/dL — AB (ref 0–99)
NONHDL: 126.85
Total CHOL/HDL Ratio: 3
Triglycerides: 67 mg/dL (ref 0.0–149.0)
VLDL: 13.4 mg/dL (ref 0.0–40.0)

## 2018-12-17 NOTE — Patient Instructions (Addendum)
BEFORE YOU LEAVE: -labs -follow up:  4 months  Start the [place]. Get at least 150 minutes of aerobic exercise per week.  We have ordered labs or studies at this visit. It can take up to 1-2 weeks for results and processing. IF results require follow up or explanation, we will call you with instructions. Clinically stable results will be released to your [facility]. If you have not heard from [place] or cannot find your results in [facility] in 2 weeks please contact our office at [phone].  If you are not yet signed up for [facility], please consider signing up.   We recommend the following healthy lifestyle for LIFE: 1) Small portions. But, make sure to get regular (at least 3 per day), healthy meals and small healthy snacks if needed.  2) Eat a healthy clean diet.   TRY TO EAT: -at least 5-7 servings of low sugar, colorful, and nutrient rich vegetables per day (not corn, potatoes or bananas.) -berries are the best choice if you wish to eat fruit (only eat small amounts if trying to reduce weight)  -lean meets (fish, white meat of chicken or [place]) -vegan proteins for some meals - beans or tofu, whole grains, nuts and seeds -Replace bad fats with good fats - good fats include: fish, nuts and seeds, canola oil, olive oil -small amounts of low fat or non fat dairy -small amounts of100 % whole grains - check the lables -drink plenty of water  AVOID: -SUGAR, sweets, anything with added sugar, corn syrup or sweeteners - must read labels as even foods advertised as "healthy" often are loaded with sugar -if you must have a sweetener, small amounts of stevia may be best -sweetened beverages and artificially sweetened beverages -simple starches (rice, bread, potatoes, pasta, chips, etc - small amounts of 100% whole grains are ok) -red meat, pork, butter -fried foods, fast food, processed food, excessive dairy, eggs and coconut.  3)Get at least 150 minutes of sweaty aerobic exercise per  week.  4)Reduce stress - consider counseling, meditation and relaxation to balance other aspects of your life.

## 2019-01-12 ENCOUNTER — Encounter: Payer: Self-pay | Admitting: Family Medicine

## 2019-01-12 ENCOUNTER — Ambulatory Visit: Payer: BLUE CROSS/BLUE SHIELD | Admitting: Family Medicine

## 2019-01-12 VITALS — BP 122/76 | HR 69 | Temp 97.9°F | Resp 12 | Ht 64.5 in | Wt 166.0 lb

## 2019-01-12 DIAGNOSIS — J029 Acute pharyngitis, unspecified: Secondary | ICD-10-CM

## 2019-01-12 DIAGNOSIS — J069 Acute upper respiratory infection, unspecified: Secondary | ICD-10-CM | POA: Diagnosis not present

## 2019-01-12 LAB — POCT RAPID STREP A (OFFICE): Rapid Strep A Screen: NEGATIVE

## 2019-01-12 LAB — POCT INFLUENZA A/B
Influenza A, POC: NEGATIVE
Influenza B, POC: NEGATIVE

## 2019-01-12 MED ORDER — MAGIC MOUTHWASH W/LIDOCAINE: 5.0000 mL | Freq: Three times a day (TID) | ORAL | 0 refills | Status: AC | PRN

## 2019-01-12 NOTE — Addendum Note (Signed)
Addended by: Zacarias Pontes on: 01/12/2019 04:34 PM   Modules accepted: Orders

## 2019-01-12 NOTE — Patient Instructions (Addendum)
  [name] I have seen you today for an acute visit.  A few things to remember from today's visit:   Sore throat - Plan: POC Rapid Strep A, POC Influenza A/B, magic mouthwash w/lidocaine SOLN  URI, acute - Plan: POC Influenza A/B   Symptomatic treatment: Over the counter Acetaminophen 500 mg and/or Ibuprofen (400-600 mg) if there is not contraindications; you can alternate in between both every 4-6 hours. Gargles with saline water and throat lozenges might also help. Cold fluids.    Seek prompt medical evaluation if you are having difficulty breathing, mouth swelling, throat closing up, not able to swallow liquids (drooling), skin rash/bruising, or worsening symptoms.  Please follow up in 2 weeks if not any better.     In general please monitor for signs of worsening symptoms and seek immediate medical attention if any concerning.  If symptoms are not resolved in 1-2 weeks you should schedule a follow up appointment with your doctor, before if needed.  I hope you get better soon!

## 2019-01-12 NOTE — Progress Notes (Signed)
ACUTE VISIT  HPI:  Chief Complaint  Patient presents with  . Sore Throat    started Sunday    [name] is a 68 y.o.female with history of chronic lymphocytic leukemia is here today complaining of 2 days of respiratory symptoms. Sore throat is moderate, she denies dysphasia or stridor. Today she has some ear fullness sensation. She has not noted fever or chills. Associated fatigue.  She denies cough, dyspnea, or wheezing. Mild nasal congestion and rhinorrhea.   HPI   No Hx of recent overseas travel, she was recently in [facility] No known sick contact. No known insect bite.  Hx of allergies: Negative  OTC medications for this problem: None  She has a prescription for azithromycin given by her oncologist in case she develops URI symptoms but she prefers not to take antibiotic if she does not need to do so.   Review of Systems  Constitutional: Positive for activity change and fatigue. Negative for appetite change and fever.  HENT: Positive for congestion, postnasal drip, rhinorrhea and sore throat. Negative for ear pain, mouth sores, sinus pressure, trouble swallowing and voice change.   Eyes: Negative for discharge and redness.  Respiratory: Negative for cough, shortness of breath and wheezing.   Gastrointestinal: Negative for abdominal pain, diarrhea, nausea and vomiting.  Musculoskeletal: Negative for gait problem, myalgias and neck pain.  Skin: Negative for rash.  Allergic/Immunologic: Negative for environmental allergies.  Neurological: Negative for weakness and headaches.  Hematological: Does not bruise/bleed easily.      No current outpatient medications on file prior to visit.   No current facility-administered medications on file prior to visit.      Past Medical History:  Diagnosis Date  . CARPAL TUNNEL SYNDROME, MILD 12/05/2008  . Chronic cough - seeing pulmonologist in [place], [facility] 03/14/2014  . HSV  03/01/2010  . Leg skin lesion, left 01/30/2015   precancerous cells per patient  . OSTEOPENIA 12/05/2008  . PARESTHESIA 09/01/2009  . Waldenstroms macroglobulinemia - followed by Dr. Carolynn Sayers, [place], [place] 03/15/2014   Allergies  Allergen Reactions  . Ciprofloxacin Hcl     Other reaction(s): Unknown REACTION: nausea  . Ciprofloxacin Hcl     REACTION: nausea  . Ciprofloxacin Nausea Only    Social History   Socioeconomic History  . Marital status: Married    Spouse name: Not on file  . Number of children: Not on file  . Years of education: Not on file  . Highest education level: Not on file  Occupational History  . Not on file  Social Needs  . Financial resource strain: Not on file  . Food insecurity:    Worry: Not on file    Inability: Not on file  . Transportation needs:    Medical: Not on file    Non-medical: Not on file  Tobacco Use  . Smoking status: Never Smoker  . Smokeless tobacco: Never Used  Substance and Sexual Activity  . Alcohol use: Yes    Alcohol/week: 6.0 standard drinks    Types: 6 Glasses of wine per week  . Drug use: No  . Sexual activity: Not on file  Lifestyle  . Physical activity:    Days per week: Not on file    Minutes per session: Not on file  . Stress: Not on file  Relationships  . Social connections:    Talks on phone: Not on file    Gets together: Not on file  Attends religious service: Not on file    Active member of club or organization: Not on file    Attends meetings of clubs or organizations: Not on file    Relationship status: Not on file  Other Topics Concern  . Not on file  Social History Narrative   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:      Spiritual Beliefs:      Lifestyle:             Vitals:   01/12/19 1115  BP: 122/76  Pulse: 69  Resp: 12  Temp: 97.9 F (36.6 C)  SpO2: 99%   Body mass index is 28.05 kg/m.   Physical Exam  Nursing note and vitals reviewed. Constitutional:  She is oriented to person, place, and time. She appears well-developed and well-nourished. She does not appear ill. No distress.  HENT:  Head: Normocephalic and atraumatic.  Right Ear: Tympanic membrane, external ear and ear canal normal.  Left Ear: Tympanic membrane, external ear and ear canal normal.  Mouth/Throat: Uvula is midline and mucous membranes are normal. Posterior oropharyngeal erythema (mild) present. No oropharyngeal exudate or posterior oropharyngeal edema.  Eyes: Conjunctivae are normal.  Cardiovascular: Normal rate and regular rhythm.  No murmur heard. Respiratory: Effort normal and breath sounds normal. No stridor. No respiratory distress.  Lymphadenopathy:       Head (right side): No submandibular adenopathy present.       Head (left side): No submandibular adenopathy present.    She has no cervical adenopathy.  Neurological: She is alert and oriented to person, place, and time. She has normal strength.  Skin: Skin is warm. No rash noted. No erythema.  Psychiatric: Her mood appears anxious.  Well groomed, good eye contact.     ASSESSMENT AND PLAN:   Paula [name] was seen today for sore throat.  Diagnoses and all orders for this visit:  Sore throat -     POC Rapid Strep A -     POC Influenza A/B -     magic mouthwash w/lidocaine SOLN; Take 5 mLs by mouth 3 (three) times daily as needed for up to 10 days for mouth pain. 50 ml of diphenhydramine, alum and mag hydroxide, and lidocaine to make 150 ml  URI, acute -     POC Influenza A/B   Explained that symptoms are most likely viral, in which case symptomatic treatment is recommended. Rapid strep and rapid flu test done here in the office negative. We will follow strep culture. Recommend gargles with Magic mouthwash with lidocaine as well as OTC throat lozenges. Instructed about warning signs. Acetaminophen 500 mg 3 times daily as needed for sore throat.    Return if symptoms worsen or fail to  improve.      Darrin Koman G. [place], MD  [facility]. [place] office.

## 2019-01-14 LAB — CULTURE, GROUP A STREP
MICRO NUMBER:: 53028
SPECIMEN QUALITY:: ADEQUATE

## 2019-01-19 DIAGNOSIS — J471 Bronchiectasis with (acute) exacerbation: Secondary | ICD-10-CM | POA: Diagnosis not present

## 2019-01-19 DIAGNOSIS — J4531 Mild persistent asthma with (acute) exacerbation: Secondary | ICD-10-CM | POA: Diagnosis not present

## 2019-01-19 DIAGNOSIS — D801 Nonfamilial hypogammaglobulinemia: Secondary | ICD-10-CM | POA: Diagnosis not present

## 2019-02-04 DIAGNOSIS — D225 Melanocytic nevi of trunk: Secondary | ICD-10-CM | POA: Diagnosis not present

## 2019-02-04 DIAGNOSIS — L821 Other seborrheic keratosis: Secondary | ICD-10-CM | POA: Diagnosis not present

## 2019-02-04 DIAGNOSIS — Z23 Encounter for immunization: Secondary | ICD-10-CM | POA: Diagnosis not present

## 2019-02-04 DIAGNOSIS — L570 Actinic keratosis: Secondary | ICD-10-CM | POA: Diagnosis not present

## 2019-02-04 DIAGNOSIS — D2372 Other benign neoplasm of skin of left lower limb, including hip: Secondary | ICD-10-CM | POA: Diagnosis not present

## 2019-02-23 ENCOUNTER — Ambulatory Visit: Payer: BLUE CROSS/BLUE SHIELD | Admitting: Family Medicine

## 2019-02-23 ENCOUNTER — Encounter: Payer: Self-pay | Admitting: Family Medicine

## 2019-02-23 VITALS — BP 120/68 | HR 75 | Temp 98.3°F | Ht 64.5 in | Wt 163.9 lb

## 2019-02-23 DIAGNOSIS — M25571 Pain in right ankle and joints of right foot: Secondary | ICD-10-CM | POA: Diagnosis not present

## 2019-02-23 DIAGNOSIS — M21961 Unspecified acquired deformity of right lower leg: Secondary | ICD-10-CM

## 2019-02-23 DIAGNOSIS — R030 Elevated blood-pressure reading, without diagnosis of hypertension: Secondary | ICD-10-CM

## 2019-02-23 NOTE — Progress Notes (Signed)
120/80 

## 2019-02-23 NOTE — Patient Instructions (Signed)
BEFORE YOU LEAVE: -follow up: Annual Exam if due in 3-4 months  -We placed a referral for you as discussed to the orthopedic specialist. It usually takes about 1-2 weeks to process and schedule this referral. If you have not heard from [place] regarding this appointment in 2 weeks please contact our office.  Continue lifestyle changes for the blood pressure.

## 2019-02-23 NOTE — Progress Notes (Signed)
  HPI:  Using dictation device. Unfortunately this device frequently misinterprets words/phrases.  Acute visit for ankle pain: -for 2 months -hx remote tib/fib shaft fx with pins at age 68, ever since some deformity of the ankle -however has noticed increased deformity, pain and ankle swelling, inversion rolling of ankle recently -no weakness, numbness, redness, trauma -she is worried about this worsening and wonders if needs to see specialist  ROS: See pertinent positives and negatives per HPI.  Past Medical History:  Diagnosis Date  . CARPAL TUNNEL SYNDROME, MILD 12/05/2008  . Chronic cough - seeing pulmonologist in [place], [facility] 03/14/2014  . HSV 03/01/2010  . Leg skin lesion, left 01/30/2015   precancerous cells per patient  . OSTEOPENIA 12/05/2008  . PARESTHESIA 09/01/2009  . Waldenstroms macroglobulinemia - followed by Dr. Carolynn Sayers, [place], [place] 03/15/2014    Past Surgical History:  Procedure Laterality Date  . CHOLECYSTECTOMY  02/2017  . LEG SKIN LESION  BIOPSY / EXCISION Left 01/30/2015   Dr Santiago Glad Gould-precancerous cells per patient  . ORIF TIBIA & FIBULA FRACTURES Right 1988  . TONSILLECTOMY AND ADENOIDECTOMY  1958    Family History  Problem Relation Age of Onset  . Hypertension Mother   . Hypertension Brother   . Colon cancer Father 5  . Hypertension Son   . Cancer Neg Hx        lung ca - mother's side  . Arthritis Neg Hx        osteo - mother's side  . Breast cancer Neg Hx     SOCIAL HX: see hpi  No current outpatient medications on file.  EXAM:  Vitals:   02/23/19 1524  BP: 120/68  Pulse: 75  Temp: 98.3 F (36.8 C)    Body mass index is 27.7 kg/m.  GENERAL: vitals reviewed and listed above, alert, oriented, appears well hydrated and in no acute distress  HEENT: atraumatic, conjunttiva clear, no obvious abnormalities on inspection of external nose and ears  NECK: no obvious masses on inspection  LUNGS: clear  to auscultation bilaterally, no wheezes, rales or rhonchi, good air movement  CV: HRRR, no peripheral edema  MS: moves all extremities without noticeable abnormality, has valgus deformity R ankle, shaft def tib/fib from prior fx surgery, some swelling of R ankle, valgus ankle collapse with bearing weight  PSYCH: pleasant and cooperative, no obvious depression or anxiety  ASSESSMENT AND PLAN:  Discussed the following assessment and plan:  Acute right ankle pain - Plan: Ambulatory referral to Orthopedic Surgery Right ankle joint deformity - Plan: Ambulatory referral to Orthopedic Surgery -fairly sig deformity on exam, referral to ortho  Elevated blood pressure reading -much better on recheck, she has been working on lifestyle changes, continue with this and monitor  -Patient advised to return or notify a doctor immediately if symptoms worsen or persist or new concerns arise.  Patient Instructions  BEFORE YOU LEAVE: -follow up: Annual Exam if due in 3-4 months  -We placed a referral for you as discussed to the orthopedic specialist. It usually takes about 1-2 weeks to process and schedule this referral. If you have not heard from [place] regarding this appointment in 2 weeks please contact our office.  Continue lifestyle changes for the blood pressure.    Lucretia Kern, DO

## 2019-03-04 ENCOUNTER — Ambulatory Visit (INDEPENDENT_AMBULATORY_CARE_PROVIDER_SITE_OTHER): Payer: BLUE CROSS/BLUE SHIELD | Admitting: Orthopedic Surgery

## 2019-04-19 ENCOUNTER — Ambulatory Visit: Payer: BLUE CROSS/BLUE SHIELD | Admitting: Family Medicine

## 2019-05-26 DIAGNOSIS — C880 Waldenstrom macroglobulinemia: Secondary | ICD-10-CM | POA: Diagnosis not present

## 2019-05-26 DIAGNOSIS — C9110 Chronic lymphocytic leukemia of B-cell type not having achieved remission: Secondary | ICD-10-CM | POA: Diagnosis not present

## 2019-06-14 ENCOUNTER — Other Ambulatory Visit: Payer: Self-pay

## 2019-06-14 ENCOUNTER — Ambulatory Visit (INDEPENDENT_AMBULATORY_CARE_PROVIDER_SITE_OTHER): Payer: BC Managed Care – PPO | Admitting: Orthopedic Surgery

## 2019-06-14 ENCOUNTER — Encounter: Payer: Self-pay | Admitting: Orthopedic Surgery

## 2019-06-14 ENCOUNTER — Ambulatory Visit: Payer: Self-pay

## 2019-06-14 VITALS — Ht 65.5 in | Wt 156.0 lb

## 2019-06-14 DIAGNOSIS — S82401S Unspecified fracture of shaft of right fibula, sequela: Secondary | ICD-10-CM

## 2019-06-14 DIAGNOSIS — S82201S Unspecified fracture of shaft of right tibia, sequela: Secondary | ICD-10-CM | POA: Diagnosis not present

## 2019-06-14 DIAGNOSIS — M25571 Pain in right ankle and joints of right foot: Secondary | ICD-10-CM

## 2019-06-14 DIAGNOSIS — M79661 Pain in right lower leg: Secondary | ICD-10-CM | POA: Diagnosis not present

## 2019-06-14 NOTE — Progress Notes (Signed)
Office Visit Note   Patient: Paula [name]           Date of Birth: [birth year]           MRN: [id number] Visit Date: 06/14/2019              Requested by: Lucretia Kern, DO [address] [place],   [address] PCP: Lucretia Kern, DO  Chief Complaint  Patient presents with  . Right Ankle - Pain      HPI: Patient is a 68 year old woman who presents complaining of right ankle pain.  She states she sustained a skiing accident about when she was 68 years old she states she had an open spiral fracture she had prolonged course of treatment which took about 9 weeks for this to heal.  Patient states that she now has malalignment of her foot she feels like she leans to the inside of her foot with standing.  And she states she has swelling in the ankle and soreness if she walks a couple miles she states she is also active with Pilates.  Assessment & Plan: Visit Diagnoses:  1. Pain in right ankle and joints of right foot   2. Pain in right lower leg   3. Tibia/fibula fracture, right, sequela     Plan: Recommended sole orthotics with a cut out for the first ray  Follow-Up Instructions: Return if symptoms worsen or fail to improve.   Ortho Exam  Patient is alert, oriented, no adenopathy, well-dressed, normal affect, normal respiratory effort. Examination patient has good pulses she has a pronated foot with a plantarflexed first ray.  She can do a single limb heel raise with good posterior tibial tendon function.  Patient does have an antalgic gait.  There is no redness no cellulitis no drainage from her previous open fracture no clinical signs of infection  Imaging: Xr Ankle Complete Right  Result Date: 06/14/2019 3 view radiographs of the right ankle shows a congruent mortise with the leg varus with no subcondylar cysts or sclerosis.  Xr Tibia/fibula Right  Result Date: 06/14/2019 2 view radiographs of the right tibia shows a healed fracture junction of the middle and  distal third with 2 retained screws.  The tibia has healed in varus.  No images are attached to the encounter.  Labs: Lab Results  Component Value Date   HGBA1C 5.7 08/13/2018   HGBA1C 5.3 04/20/2014     Lab Results  Component Value Date   ALBUMIN 4.3 01/30/2017   ALBUMIN 3.9 01/28/2017   ALBUMIN 4.1 12/05/2008    Body mass index is 25.56 kg/m.  Orders:  Orders Placed This Encounter  Procedures  . XR Ankle Complete Right  . XR Tibia/Fibula Right   No orders of the defined types were placed in this encounter.    Procedures: No procedures performed  Clinical Data: No additional findings.  ROS:  All other systems negative, except as noted in the HPI. Review of Systems  Objective: Vital Signs: Ht 5' 5.5" (1.664 m)   Wt 156 lb (70.8 kg)   BMI 25.56 kg/m   Specialty Comments:  No specialty comments available.  PMFS History: Patient Active Problem List   Diagnosis Date Noted  . CLL (chronic lymphocytic leukemia) ([place]) 03/16/2018  . Vaccine contraindicated - live vaccines contraindicated 04/20/2014  . Waldenstroms macroglobulinemia - followed by Dr. Carolynn Sayers, [place], [place] 03/15/2014  . Chronic cough - seeing pulmonologist in [place], [facility] 03/14/2014  .  OSTEOPENIA 12/05/2008   Past Medical History:  Diagnosis Date  . CARPAL TUNNEL SYNDROME, MILD 12/05/2008  . Chronic cough - seeing pulmonologist in [place], [facility] 03/14/2014  . HSV 03/01/2010  . Leg skin lesion, left 01/30/2015   precancerous cells per patient  . OSTEOPENIA 12/05/2008  . PARESTHESIA 09/01/2009  . Waldenstroms macroglobulinemia - followed by Dr. Carolynn Sayers, [address], [place] 03/15/2014    Family History  Problem Relation Age of Onset  . Hypertension Mother   . Hypertension Brother   . Colon cancer Father 59  . Hypertension Son   . Cancer Neg Hx        lung ca - mother's side  . Arthritis Neg Hx        osteo - mother's side   . Breast cancer Neg Hx     Past Surgical History:  Procedure Laterality Date  . CHOLECYSTECTOMY  02/2017  . LEG SKIN LESION  BIOPSY / EXCISION Left 01/30/2015   Dr Santiago Glad Gould-precancerous cells per patient  . ORIF TIBIA & FIBULA FRACTURES Right 1988  . TONSILLECTOMY AND ADENOIDECTOMY  1958   Social History   Occupational History  . Not on file  Tobacco Use  . Smoking status: Never Smoker  . Smokeless tobacco: Never Used  Substance and Sexual Activity  . Alcohol use: Yes    Alcohol/week: 6.0 standard drinks    Types: 6 Glasses of wine per week  . Drug use: No  . Sexual activity: Not on file

## 2019-06-25 ENCOUNTER — Encounter: Payer: Self-pay | Admitting: Family Medicine

## 2019-06-25 ENCOUNTER — Other Ambulatory Visit: Payer: Self-pay

## 2019-06-25 ENCOUNTER — Ambulatory Visit (INDEPENDENT_AMBULATORY_CARE_PROVIDER_SITE_OTHER): Payer: BC Managed Care – PPO | Admitting: Family Medicine

## 2019-06-25 DIAGNOSIS — M25571 Pain in right ankle and joints of right foot: Secondary | ICD-10-CM | POA: Diagnosis not present

## 2019-06-25 DIAGNOSIS — G8929 Other chronic pain: Secondary | ICD-10-CM

## 2019-06-25 DIAGNOSIS — C8800 Waldenstrom macroglobulinemia not having achieved remission: Secondary | ICD-10-CM

## 2019-06-25 DIAGNOSIS — S82201S Unspecified fracture of shaft of right tibia, sequela: Secondary | ICD-10-CM | POA: Diagnosis not present

## 2019-06-25 DIAGNOSIS — S82401S Unspecified fracture of shaft of right fibula, sequela: Secondary | ICD-10-CM

## 2019-06-25 NOTE — Progress Notes (Signed)
Virtual Visit via Video Note  I connected with Paula [name] on 06/25/19 at  1:30 PM EDT by a video enabled telemedicine application and verified that I am speaking with the correct person using two identifiers.  Location patient: home Location provider:work or home office Persons participating in the virtual visit: patient, provider  I discussed the limitations of evaluation and management by telemedicine and the availability of in person appointments. The patient expressed understanding and agreed to proceed.   HPI: Pt is a 68 yo female seen for f/u and TOC, previously seen by Dr. Maudie Mercury.  States overall healthy.  Waldenstroms macroglobulinemia -dx'd 4 yrs ago -causes reduced immune response, will stay sick for a few wks if gets sick -sees Pulm in [place] -has blood work q 3 months to monitor things.  No changes, so continues to monitor -has oncology appt in 2 wks at [facility] in [facility].  R ankle injury -tib/fib fx after a ski accident in her 52s -now feels like she is walking inward on R foot. -went to Ortho recently, Dr. Sharol Given -given an insert.  Makes ankle/foot hurt more. -was doing pilates for exercise.  Allergies:  Ciprofloxacin- nausea  Social hx:  Pt is married.  Her husband was previously seen by Dr. Maudie Mercury and now seen by this provider.  Pt works for [employer] as a Administrator, sports.  Was working from home prior to [employer] pandemic.  Contemplating retirement.  ROS: See pertinent positives and negatives per HPI.  Past Medical History:  Diagnosis Date  . CARPAL TUNNEL SYNDROME, MILD 12/05/2008  . Chronic cough - seeing pulmonologist in [place], [facility] 03/14/2014  . HSV 03/01/2010  . Leg skin lesion, left 01/30/2015   precancerous cells per patient  . OSTEOPENIA 12/05/2008  . PARESTHESIA 09/01/2009  . Waldenstroms macroglobulinemia - followed by Dr. Carolynn Sayers, [place], [place] 03/15/2014    Past Surgical History:  Procedure Laterality  Date  . CHOLECYSTECTOMY  02/2017  . LEG SKIN LESION  BIOPSY / EXCISION Left 01/30/2015   Dr Santiago Glad Gould-precancerous cells per patient  . ORIF TIBIA & FIBULA FRACTURES Right 1988  . TONSILLECTOMY AND ADENOIDECTOMY  1958    Family History  Problem Relation Age of Onset  . Hypertension Mother   . Hypertension Brother   . Colon cancer Father 49  . Hypertension Son   . Cancer Neg Hx        lung ca - mother's side  . Arthritis Neg Hx        osteo - mother's side  . Breast cancer Neg Hx     No current outpatient medications on file.  EXAM:  VITALS per patient if applicable: RR between 40-98 bpm  GENERAL: alert, oriented, appears well and in no acute distress  HEENT: atraumatic, conjunctiva clear, no obvious abnormalities on inspection of external nose and ears  NECK: normal movements of the head and neck  LUNGS: on inspection no signs of respiratory distress, breathing rate appears normal, no obvious gross SOB, gasping or wheezing  CV: no obvious cyanosis  MS: moves all visible extremities without noticeable abnormality  PSYCH/NEURO: pleasant and cooperative, no obvious depression or anxiety, speech and thought processing grossly intact  ASSESSMENT AND PLAN:  Discussed the following assessment and plan:  Waldenstroms macroglobulinemia  -stable -encouraged to keep f/u with Dr. Carolynn Sayers, [place], [facility] in a few wks.  Chronic pain of right ankle  -s/p tib/fib fx 40+ yrs ago -slightly worsened since using shoe insert -discussed exercising/stretching -  as insert not helping, pt advised to f/u with Ortho for more options.  Tibia/fibula fracture, right, sequela  -continue f/u with Ortho  F/u prn   I discussed the assessment and treatment plan with the patient. The patient was provided an opportunity to ask questions and all were answered. The patient agreed with the plan and demonstrated an understanding of the instructions.   The patient was advised to call  back or seek an in-person evaluation if the symptoms worsen or if the condition fails to improve as anticipated.  Billie Ruddy, MD

## 2019-07-28 DIAGNOSIS — C880 Waldenstrom macroglobulinemia: Secondary | ICD-10-CM | POA: Diagnosis not present

## 2019-07-28 DIAGNOSIS — C9110 Chronic lymphocytic leukemia of B-cell type not having achieved remission: Secondary | ICD-10-CM | POA: Diagnosis not present

## 2019-08-16 ENCOUNTER — Encounter: Payer: BLUE CROSS/BLUE SHIELD | Admitting: Family Medicine

## 2019-08-19 DIAGNOSIS — H53453 Other localized visual field defect, bilateral: Secondary | ICD-10-CM | POA: Diagnosis not present

## 2019-10-26 DIAGNOSIS — H903 Sensorineural hearing loss, bilateral: Secondary | ICD-10-CM | POA: Diagnosis not present

## 2019-11-08 ENCOUNTER — Other Ambulatory Visit: Payer: Self-pay | Admitting: Obstetrics and Gynecology

## 2019-11-08 DIAGNOSIS — Z1231 Encounter for screening mammogram for malignant neoplasm of breast: Secondary | ICD-10-CM

## 2019-11-09 DIAGNOSIS — L719 Rosacea, unspecified: Secondary | ICD-10-CM | POA: Diagnosis not present

## 2019-12-27 ENCOUNTER — Ambulatory Visit: Payer: Self-pay

## 2019-12-27 NOTE — Telephone Encounter (Signed)
Incoming call from Patient reporting that lately she hasn't been feeling well .  Has recently has been checking her blood Pressures. Ranging from 148-168 /76-90.  Today it was 148/84, 148/82.  Patient reports occasional dizziness nausea .  Denies an history of hypertension.  Denies any other Sx.  Pt.  Scheduled an appointment for 01/03/20@ 1:45pm Pt.  Voiced understanding.             Answer Assessment - Initial Assessment Questions 1. BLOOD PRESSURE: "What is the blood pressure?" "Did you take at least two measurements 5 minutes apart?"     3 weeks ago intermittent.  149/ 2. ONSET: "When did you take your blood pressure?"     4 to 5 days ago.   3. HOW: "How did you obtain the blood pressure?" (e.g., visiting nurse, automatic home BP monitor)    automatic 4. HISTORY: "Do you have a history of high blood pressure?"    Denies 5. MEDICATIONS: "Are you taking any medications for blood pressure?" "Have you missed any doses recently?"    denies 6. OTHER SYMPTOMS: "Do you have any symptoms?" (e.g., headache, chest pain, blurred vision, difficulty breathing, weakness)    Denies 7. PREGNANCY: "Is there any chance you are pregnant?" "When was your last menstrual period?"     na  Protocols used: HIGH BLOOD PRESSURE-A-AH

## 2019-12-27 NOTE — Telephone Encounter (Signed)
Spoke to pt to see if she would like to do a VV for a sooner appt. Pt stated she has a 2 new Bp cuffs I advised that it will be helpful for VV. Pt stated that she has been dizzy for some days now. Pt stated she has to sit up to keep from getting dizzy. Pt stated after sleeping sitting up she has not had an episode since. Pt stated before the dizziness went away she had the episodes for 3 weeks every other day.

## 2019-12-28 NOTE — Telephone Encounter (Signed)
Fyi Attempted to call pt to schedule appointment left a voice message for pt to call the office so she can be scheduled for tomorrow for the elevated BP.

## 2020-01-03 ENCOUNTER — Ambulatory Visit (INDEPENDENT_AMBULATORY_CARE_PROVIDER_SITE_OTHER): Payer: Medicare Other | Admitting: Family Medicine

## 2020-01-03 ENCOUNTER — Other Ambulatory Visit: Payer: Self-pay

## 2020-01-03 ENCOUNTER — Encounter: Payer: Self-pay | Admitting: Family Medicine

## 2020-01-03 VITALS — BP 140/62 | HR 79 | Temp 97.7°F | Wt 173.6 lb

## 2020-01-03 DIAGNOSIS — I10 Essential (primary) hypertension: Secondary | ICD-10-CM | POA: Diagnosis not present

## 2020-01-03 LAB — CBC WITH DIFFERENTIAL/PLATELET
Basophils Absolute: 0 10*3/uL (ref 0.0–0.1)
Basophils Relative: 0.7 % (ref 0.0–3.0)
Eosinophils Absolute: 0 10*3/uL (ref 0.0–0.7)
Eosinophils Relative: 0.7 % (ref 0.0–5.0)
HCT: 37.9 % (ref 36.0–46.0)
Hemoglobin: 12.5 g/dL (ref 12.0–15.0)
Lymphocytes Relative: 34.5 % (ref 12.0–46.0)
Lymphs Abs: 2.3 10*3/uL (ref 0.7–4.0)
MCHC: 33.1 g/dL (ref 30.0–36.0)
MCV: 99.4 fl (ref 78.0–100.0)
Monocytes Absolute: 0.6 10*3/uL (ref 0.1–1.0)
Monocytes Relative: 9.5 % (ref 3.0–12.0)
Neutro Abs: 3.6 10*3/uL (ref 1.4–7.7)
Neutrophils Relative %: 54.6 % (ref 43.0–77.0)
Platelets: 275 10*3/uL (ref 150.0–400.0)
RBC: 3.82 Mil/uL — ABNORMAL LOW (ref 3.87–5.11)
RDW: 14.3 % (ref 11.5–15.5)
WBC: 6.5 10*3/uL (ref 4.0–10.5)

## 2020-01-03 LAB — BASIC METABOLIC PANEL
BUN: 14 mg/dL (ref 6–23)
CO2: 27 mEq/L (ref 19–32)
Calcium: 9.5 mg/dL (ref 8.4–10.5)
Chloride: 102 mEq/L (ref 96–112)
Creatinine, Ser: 0.83 mg/dL (ref 0.40–1.20)
GFR: 67.94 mL/min (ref >60.00)
Glucose, Bld: 82 mg/dL (ref 70–99)
Potassium: 4.7 mEq/L (ref 3.5–5.1)
Sodium: 139 mEq/L (ref 135–145)

## 2020-01-03 LAB — HEPATIC FUNCTION PANEL
ALT: 19 U/L (ref 0–35)
AST: 20 U/L (ref 0–37)
Albumin: 4.3 g/dL (ref 3.5–5.2)
Alkaline Phosphatase: 51 U/L (ref 39–117)
Bilirubin, Direct: 0.2 mg/dL (ref 0.0–0.3)
Total Bilirubin: 0.5 mg/dL (ref 0.2–1.2)
Total Protein: 7.6 g/dL (ref 6.0–8.3)

## 2020-01-03 LAB — TSH: TSH: 0.77 u[IU]/mL (ref 0.35–4.50)

## 2020-01-03 MED ORDER — LISINOPRIL 10 MG PO TABS: 10.0000 mg | ORAL_TABLET | Freq: Every day | ORAL | 2 refills | Status: DC

## 2020-01-03 NOTE — Progress Notes (Signed)
   Subjective:    Patient ID: Paula [name], female    DOB: 03/22/1951, 69 y.o.   MRN: [id number]  HPI Here for 3 weeks of elevated BP readings as high as 168/94 and intermittent dizziness. She has a hx of vertigo and she thinks she is feeling mild versions of this. No headache or chest pain or SOB. She has a strong family hx of HTN. She has never used tobacco, but she has gained 20 lbs of weight in the past 6 months.    Review of Systems  Constitutional: Negative.   Respiratory: Negative.   Cardiovascular: Negative.   Neurological: Positive for dizziness. Negative for headaches.       Objective:   Physical Exam Constitutional:      General: She is not in acute distress.    Appearance: Normal appearance.  Cardiovascular:     Rate and Rhythm: Normal rate and regular rhythm.     Pulses: Normal pulses.     Heart sounds: Normal heart sounds.  Pulmonary:     Effort: Pulmonary effort is normal.     Breath sounds: Normal breath sounds.  Musculoskeletal:     Right lower leg: No edema.     Left lower leg: No edema.  Neurological:     General: No focal deficit present.     Mental Status: She is alert and oriented to person, place, and time.     Cranial Nerves: No cranial nerve deficit.     Motor: No weakness.     Coordination: Coordination normal.     Gait: Gait normal.           Assessment & Plan:  New onset HTN along with some mild vertigo. She will work on her diet, lose some weight, and limit her sodium intake. Start on Lisinopril 10 mg daily. Check labs today. Follow up with Dr. Volanda Napoleon, her PCP, in 3-4 weeks.  Alysia Penna, MD

## 2020-01-11 ENCOUNTER — Other Ambulatory Visit: Payer: Self-pay

## 2020-01-11 ENCOUNTER — Ambulatory Visit
Admission: RE | Admit: 2020-01-11 | Discharge: 2020-01-11 | Disposition: A | Discharge status: Home / Self Care | Payer: Medicare Other | Source: Ambulatory Visit | Attending: Obstetrics and Gynecology | Admitting: Obstetrics and Gynecology

## 2020-01-11 DIAGNOSIS — Z1231 Encounter for screening mammogram for malignant neoplasm of breast: Secondary | ICD-10-CM

## 2020-01-11 LAB — HM MAMMOGRAPHY

## 2020-01-11 IMAGING — MG DIGITAL SCREENING BILAT W/ TOMO W/ CAD
8 series · 8 of 24 positions shown · non-contrast
Comparison: Previous exam(s).

CLINICAL DATA: Screening. Ultrasound-guided core biopsy of a right
breast mass in [3X] revealed non-Hodgkin's B-cell lymphoma.

EXAM:
DIGITAL SCREENING BILATERAL MAMMOGRAM WITH TOMO AND CAD

[L MLO synth-2D]
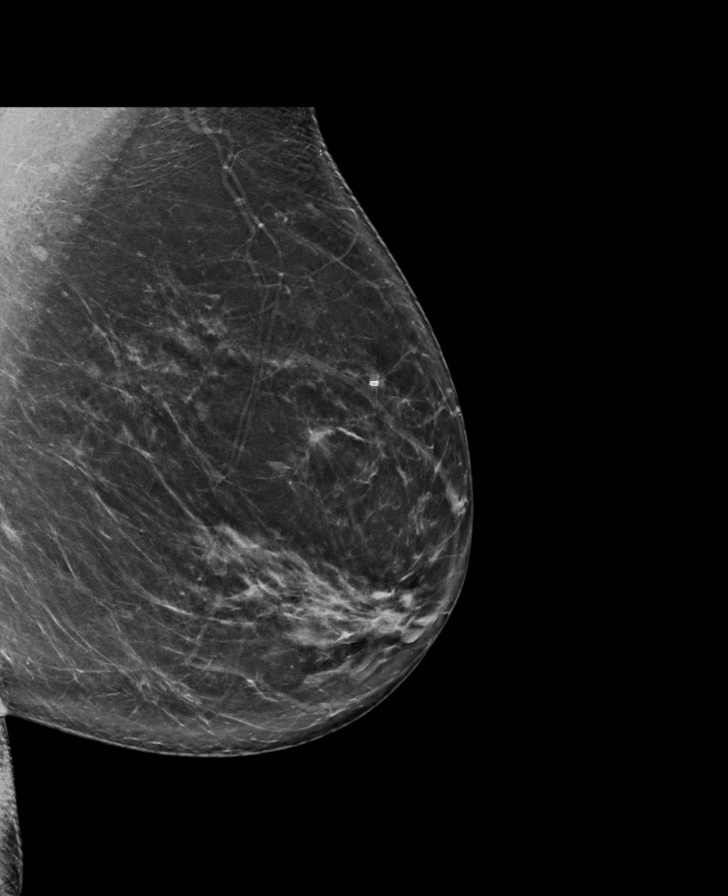

[L CC synth-2D]
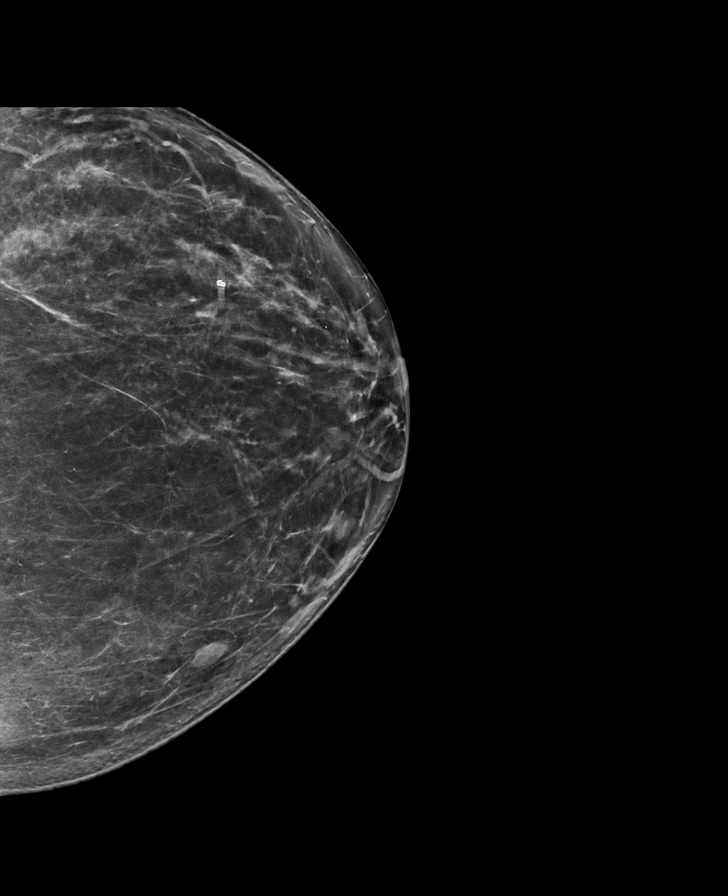

[R MLO synth-2D]
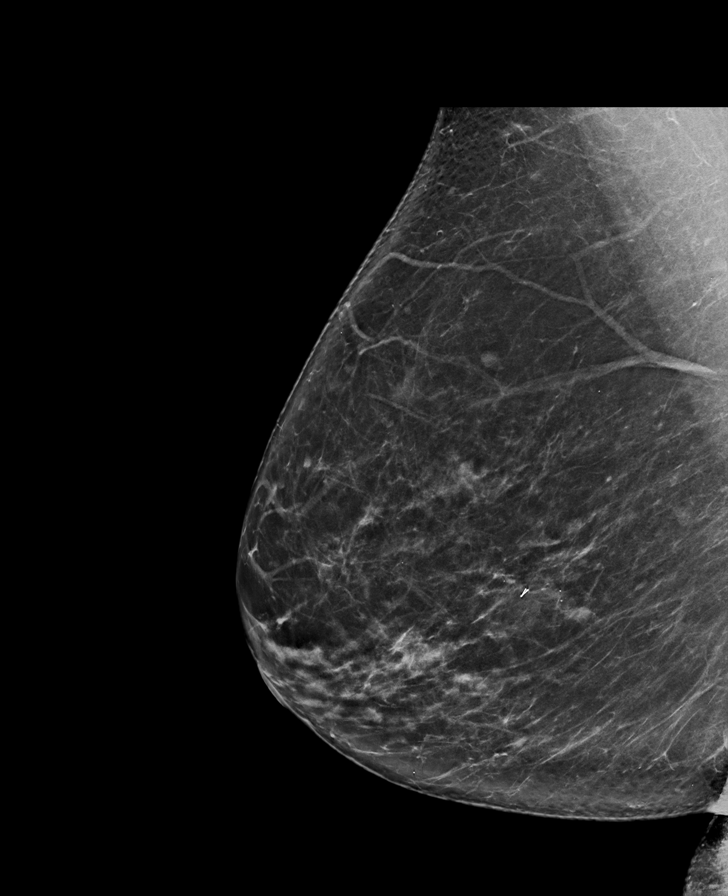

[R CC synth-2D]
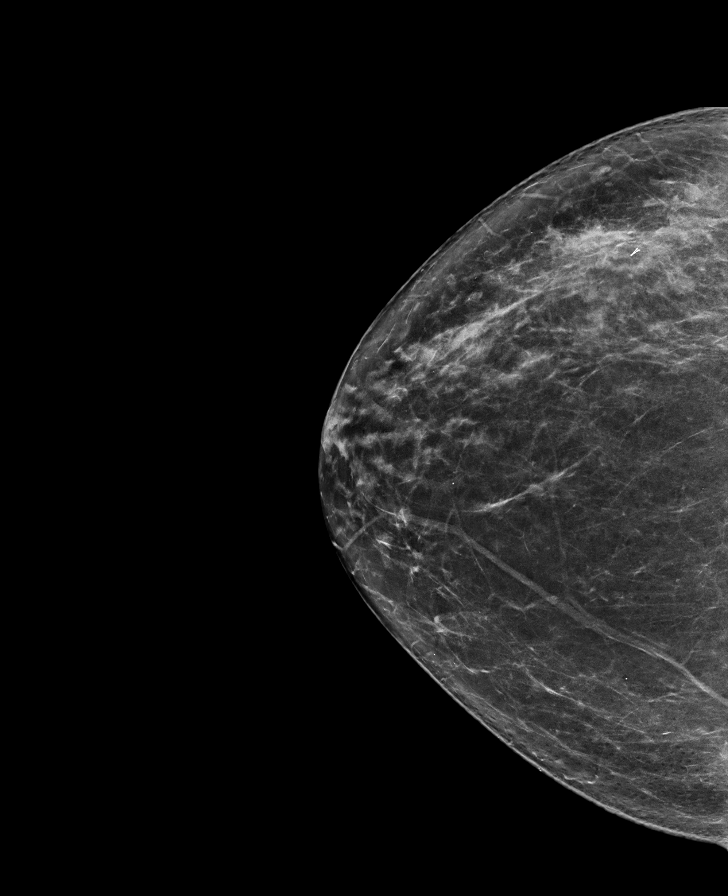

[L CC tomo · tomo slice 40/79.0]
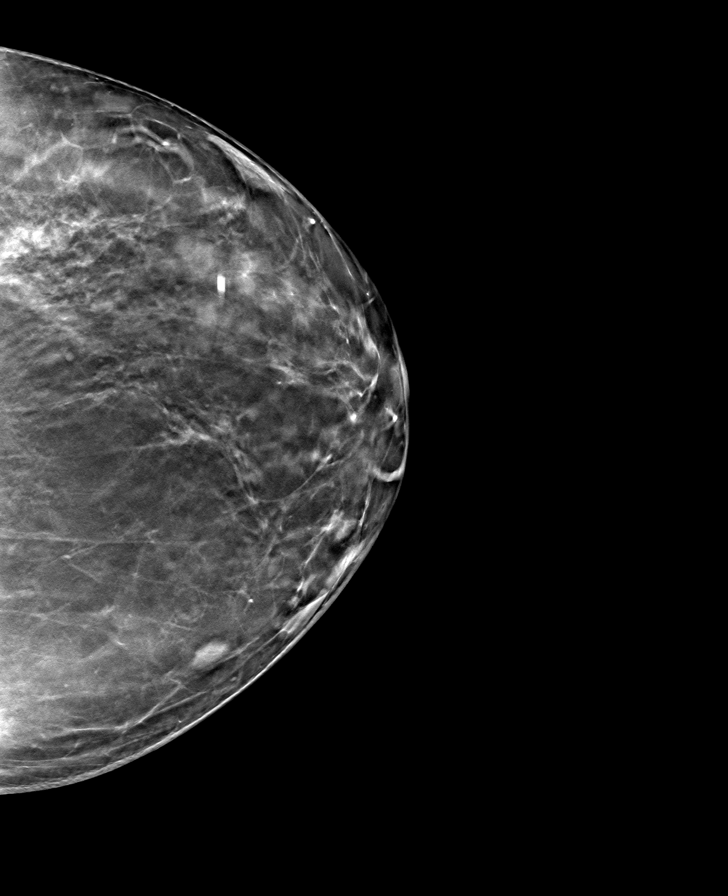

[R CC tomo · tomo slice 40/79.0]
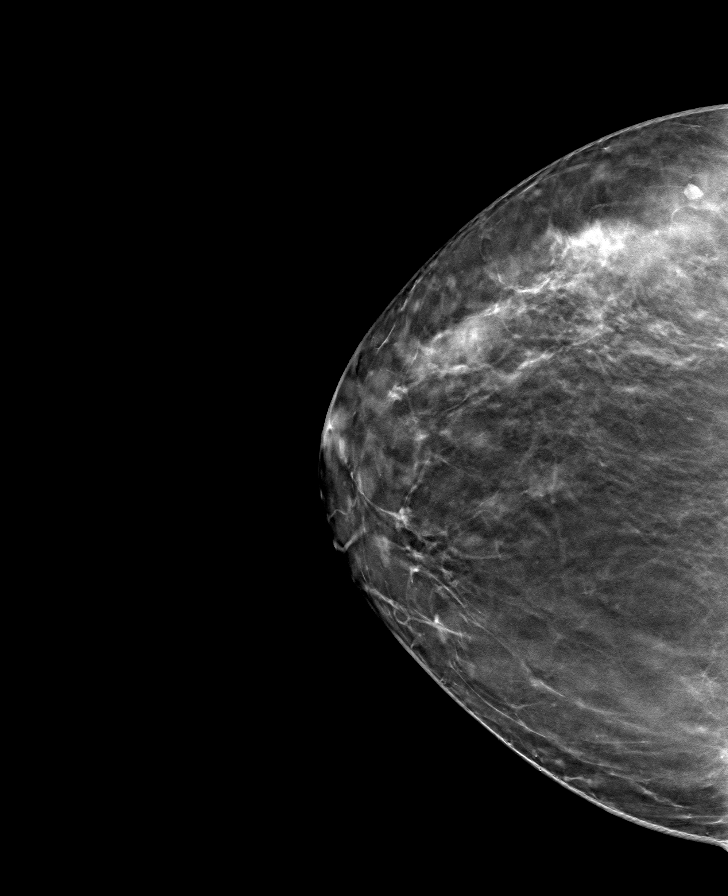

[L MLO tomo · tomo slice 45/89.0]
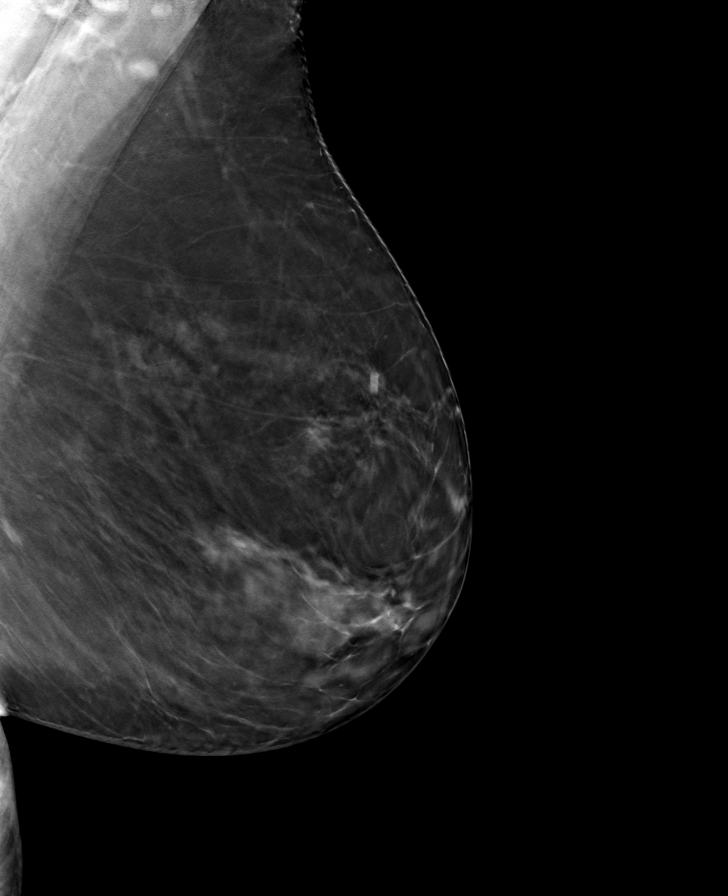

[R MLO tomo · tomo slice 43/86.0]
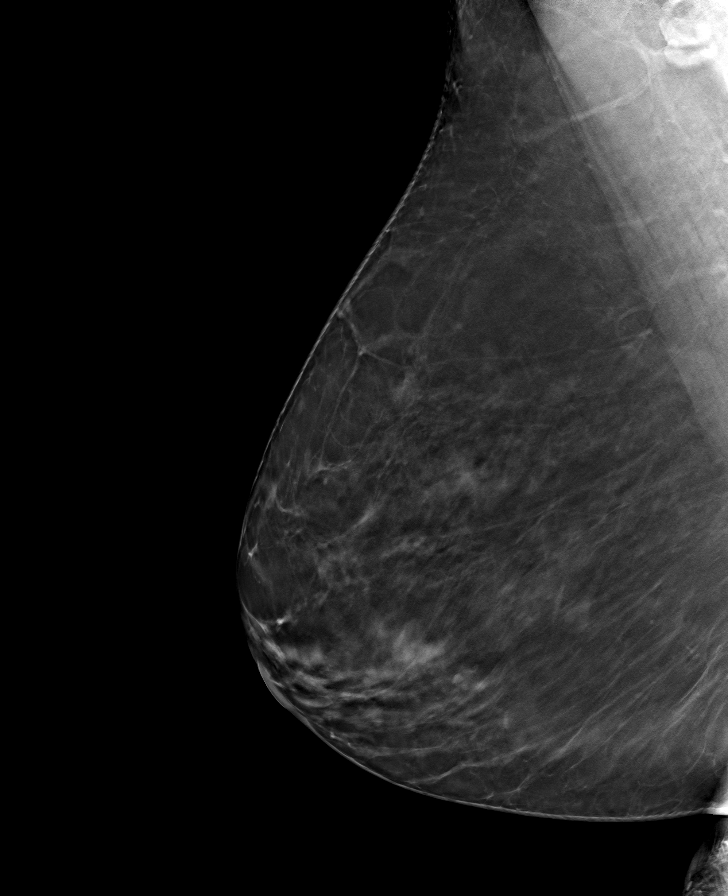

[8 of 24 positions shown; findings below may reference images not displayed]

ACR Breast Density Category b: There are scattered areas of
fibroglandular density.
FINDINGS: There are no findings suspicious for malignancy. No significant
change in mammographic appearance of nodularity in the outer right
breast at site of biopsy proven non-Hodgkin's lymphoma. Images were
processed with CAD.
IMPRESSION: No mammographic evidence of malignancy. No significant change in
mammographic appearance of nodularity in the outer right breast at
site of biopsy proven non-Hodgkin's lymphoma.

A result letter of this screening mammogram will be mailed directly
to the patient.

RECOMMENDATION:
Screening mammogram in one year. (Code:[3X])

BI-RADS CATEGORY  1: Negative.

## 2020-01-12 ENCOUNTER — Encounter: Payer: Self-pay | Admitting: Family Medicine

## 2020-01-13 ENCOUNTER — Ambulatory Visit: Payer: BC Managed Care – PPO | Admitting: Family Medicine

## 2020-01-18 ENCOUNTER — Other Ambulatory Visit: Payer: Self-pay

## 2020-01-18 ENCOUNTER — Encounter: Payer: Self-pay | Admitting: Family Medicine

## 2020-01-18 ENCOUNTER — Ambulatory Visit (INDEPENDENT_AMBULATORY_CARE_PROVIDER_SITE_OTHER): Payer: Medicare Other | Admitting: Family Medicine

## 2020-01-18 DIAGNOSIS — M1711 Unilateral primary osteoarthritis, right knee: Secondary | ICD-10-CM | POA: Diagnosis not present

## 2020-01-18 DIAGNOSIS — B001 Herpesviral vesicular dermatitis: Secondary | ICD-10-CM | POA: Diagnosis not present

## 2020-01-18 MED ORDER — ACYCLOVIR 5 % EX OINT: 1.0000 "application " | TOPICAL_OINTMENT | CUTANEOUS | 1 refills | Status: DC

## 2020-01-18 NOTE — Addendum Note (Signed)
Addended by: Lyndal Pulley on: 01/18/2020 01:30 PM   Modules accepted: Orders

## 2020-01-18 NOTE — Assessment & Plan Note (Signed)
Upper lip.  Sent in topical antibiotic for patient in the past.

## 2020-01-18 NOTE — Assessment & Plan Note (Signed)
Mild to moderate patellofemoral arthritis.  Discussed with patient that this is likely more immigrating sensation.  Hold on any type of bracing.  Home exercises given, over-the-counter medications I think will be better.  Discussed which activities to do which wants to avoid.  Follow-up with me again 4 to 8 weeks.

## 2020-01-18 NOTE — Patient Instructions (Signed)
Good to see you.  Ice 20 minutes 2 times daily. Usually after activity and before bed. Exercises 3 times a week.  Turmeric '500mg'$  daily  Tart cherry extract '1200mg'$  at night Vitamin D 2000 IU daily  See me again in 6 weeks

## 2020-01-18 NOTE — Progress Notes (Signed)
[place] Phone: [phone] Subjective:   Paula [name], am serving as a scribe for Paula [name]. This visit occurred during the SARS-CoV-2 public health emergency.  Safety protocols were in place, including screening questions prior to the visit, additional usage of staff PPE, and extensive cleaning of exam room while observing appropriate contact time as indicated for disinfecting solutions.   I'm seeing this patient by the request  of:  Paula Ruddy, MD  CC: Right-sided knee pain  [id number]  Paula [name] is a 69 y.o. female coming in with complaint of right knee pain. Broken her tibia in her 67s and states that her ankle is off center by 7 degrees. Is physically active and began having ankle pain. Bought orthotics and was told to cut out area of 1st ray. Developed knee pain 6 months ago. Pain seems to be improving over past 3 weeks after she felt a pop when she rolled over in bed. Has not gotten back into pilates or walking but aspires to do so.      Past Medical History:  Diagnosis Date  . CARPAL TUNNEL SYNDROME, MILD 12/05/2008  . Chronic cough - seeing pulmonologist in [place], [facility] 03/14/2014  . HSV 03/01/2010  . Leg skin lesion, left 01/30/2015   precancerous cells per patient  . OSTEOPENIA 12/05/2008  . PARESTHESIA 09/01/2009  . Waldenstroms macroglobulinemia - [place] Dr. Carolynn [name][name] 03/15/2014   Past Surgical History:  Procedure Laterality Date  . CHOLECYSTECTOMY  02/2017  . LEG SKIN LESION  BIOPSY / EXCISION Left 01/30/2015   Dr Paula [name]-precancerous cells per patient  . ORIF TIBIA & FIBULA FRACTURES Right 1988  . TONSILLECTOMY AND ADENOIDECTOMY  1958   Social History   Socioeconomic History  . Marital status: Married    Spouse name: Not on file  . Number of children: Not on file  . Years of education: Not on file  . Highest  education level: Not on file  Occupational History  . Not on file  Tobacco Use  . Smoking status: Never Smoker  . Smokeless tobacco: Never Used  Substance and Sexual Activity  . Alcohol use: Yes    Alcohol/week: 6.0 standard drinks    Types: 6 Glasses of wine per week  . Drug use: [name]  . Sexual activity: Not on file  Other Topics Concern  . 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 Difficulty of Paying Living Expenses: Not on file  Food Insecurity:   . Worried About Charity fundraiser in the Last Year: Not on file  . Ran Out of Food in the Last Year: Not on file  Transportation Needs:   . Lack of Transportation (Medical): Not on file  . Lack of Transportation (Non-Medical): Not on file  Physical Activity:   . Days of Exercise per Week: Not on file  . Minutes of Exercise per Session: Not on file  Stress:   . Feeling of Stress : Not on file  Social Connections:   . Frequency of Communication with Friends and Family: Not on file  . Frequency of Social Gatherings with  Friends and Family: Not on file  . Attends Religious Services: Not on file  . Active Member of Clubs or Organizations: Not on file  . Attends Archivist Meetings: Not on file  . Marital Status: Not on file   Allergies  Allergen Reactions  . Ciprofloxacin Nausea Only   Family History  Problem Relation Age of Onset  . Hypertension Mother   . Hypertension Brother   . Colon cancer Father 49  . Hypertension Son   . Cancer Neg Hx        lung ca - mother's side  . Arthritis Neg Hx        osteo - mother's side  . Breast cancer Neg Hx      Current Outpatient Medications (Cardiovascular):  .  lisinopril (ZESTRIL) 10 MG tablet, Take 1 tablet (10 mg total) by mouth daily.        Past medical history, social, surgical and  family history all reviewed in electronic medical record.  [name] pertanent information unless stated regarding to the chief complaint.   Review of Systems:  [name] headache, visual changes, nausea, vomiting, diarrhea, constipation, dizziness, abdominal pain, skin rash, fevers, chills, night sweats, weight loss, swollen lymph nodes, body aches, joint swelling, chest pain, shortness of breath, mood changes. POSITIVE muscle aches  Objective  Blood pressure 124/84, pulse 69, height 5' 5.5" (1.664 m), weight 173 lb (78.5 kg), SpO2 99 %.   General: [name] apparent distress alert and oriented x3 mood and affect normal, dressed appropriately.  HEENT: Pupils equal, extraocular movements intact  Respiratory: Patient's speak in full sentences and does not appear short of breath  Cardiovascular: [name] lower extremity edema, non tender, [name] erythema  Skin: Warm dry intact with [name] signs of infection or rash on extremities or on axial skeleton.  Abdomen: Soft nontender  Neuro: Cranial nerves II through XII are intact, neurovascularly intact in all extremities with 2+ DTRs and 2+ pulses.  Lymph: [name] lymphadenopathy of posterior or anterior cervical chain or axillae bilaterally.  Gait mild antalgic MSK: Mild tender with full range of motion and good stability and symmetric strength and tone of shoulders, elbows, wrist, hip, bilaterally Patient's right knee still has some mild lateral tracking noted.  Patient does have some mild crepitus.  Positive patellar grind test noted.  [id number]; 15 additional minutes spent for Therapeutic exercises as stated in above notes.  This included exercises focusing on stretching, strengthening, with significant focus on eccentric aspects.   Long term goals include an improvement in range of motion, strength, endurance as well as avoiding reinjury. Patient's frequency would include in 1-2 times a day, 3-5 times a week for a duration of 6-12 weeks.  Reviewed anatomy using anatomical model and how PFS  occurs.  Given rehab exercises handout for VMO, hip abductors, core, entire kinetic chain including proprioception exercises.  Could benefit from PT, regular exercise, upright biking, and a PFS knee brace to assist with tracking abnormalities. Proper technique shown and discussed handout in great detail with ATC.  All questions were discussed and answered.     Impression and Recommendations:     This case required medical decision making of moderate complexity. The above documentation has been reviewed and is accurate and complete Lyndal Pulley, DO       Note: This dictation was prepared with Dragon dictation along with smaller phrase technology. Any transcriptional errors that result from this process are unintentional.

## 2020-01-20 ENCOUNTER — Ambulatory Visit: Payer: Medicare Other | Attending: Internal Medicine

## 2020-01-20 DIAGNOSIS — Z23 Encounter for immunization: Secondary | ICD-10-CM | POA: Insufficient documentation

## 2020-01-20 NOTE — Progress Notes (Signed)
   Covid-19 Vaccination Clinic  Name:  Paula [name]    MRN: [id number] DOB: October 14, 1951  01/20/2020  [name] was observed post Covid-19 immunization for 15 minutes without incidence. She was provided with Vaccine Information Sheet and instruction to access the V-Safe system.   [name] was instructed to call 911 with any severe reactions post vaccine: Marland Kitchen Difficulty breathing  . Swelling of your face and throat  . A fast heartbeat  . A bad rash all over your body  . Dizziness and weakness    Immunizations Administered    Name Date Dose VIS Date Route   Pfizer COVID-19 Vaccine 01/20/2020  5:28 PM 0.3 mL 12/10/2019 Intramuscular   Manufacturer: [place]   Lot: [id number]   [place]: [id number]

## 2020-02-08 ENCOUNTER — Telehealth: Payer: Self-pay | Admitting: Family Medicine

## 2020-02-08 NOTE — Telephone Encounter (Signed)
Spoke with patient recommending [employer] Support Orthotics and locations where she can find them.

## 2020-02-08 NOTE — Telephone Encounter (Signed)
Patient called stating that she was told to get some sort of support for her arch in her foot but it was also flexible. Do you know what that was?

## 2020-02-09 ENCOUNTER — Ambulatory Visit: Payer: Medicare Other | Attending: Internal Medicine

## 2020-02-09 DIAGNOSIS — Z23 Encounter for immunization: Secondary | ICD-10-CM | POA: Insufficient documentation

## 2020-03-01 ENCOUNTER — Ambulatory Visit: Payer: Medicare Other | Admitting: Family Medicine

## 2020-03-28 ENCOUNTER — Ambulatory Visit: Payer: Medicare Other | Admitting: Family Medicine

## 2020-04-11 ENCOUNTER — Telehealth: Payer: Self-pay | Admitting: Family Medicine

## 2020-04-11 NOTE — Telephone Encounter (Signed)
Pt has been scheduled for CPE with fasting labs on 04/13/2020 at 2 pm

## 2020-04-11 NOTE — Telephone Encounter (Signed)
Pt is calling in wanting to have labs done she has a concern about her cholesterol.  Pt would like to have a call back to let her know that labs are possible without having a appointment with Dr. Volanda Napoleon.

## 2020-04-12 ENCOUNTER — Other Ambulatory Visit: Payer: Self-pay

## 2020-04-13 ENCOUNTER — Ambulatory Visit (INDEPENDENT_AMBULATORY_CARE_PROVIDER_SITE_OTHER): Payer: Medicare Other | Admitting: Family Medicine

## 2020-04-13 ENCOUNTER — Encounter: Payer: Self-pay | Admitting: Family Medicine

## 2020-04-13 VITALS — BP 126/70 | HR 74 | Temp 97.8°F | Wt 167.0 lb

## 2020-04-13 DIAGNOSIS — E785 Hyperlipidemia, unspecified: Secondary | ICD-10-CM | POA: Diagnosis not present

## 2020-04-13 DIAGNOSIS — M1711 Unilateral primary osteoarthritis, right knee: Secondary | ICD-10-CM

## 2020-04-13 DIAGNOSIS — C880 Waldenstrom macroglobulinemia: Secondary | ICD-10-CM

## 2020-04-13 DIAGNOSIS — Z0000 Encounter for general adult medical examination without abnormal findings: Secondary | ICD-10-CM | POA: Diagnosis not present

## 2020-04-13 NOTE — Progress Notes (Signed)
Subjective:     Paula [name] is a 69 y.o. female and is here for a comprehensive physical exam. Pt is not fasting.  The patient reports problems - knee pain x 6 mo.  Recently seen by ortho?  Endorses occasional pain and clicking noise with walking.  Pt notes h/o right lower tib-fib fx.  Noticing mild edema in the right ankle and thinks it may be causing her knee pain.  Pt has labs done regularly with oncology given h/o Wallenstroms macroglobulinemia and CLL.  Pt ptherwise doing well.  Going to Pilates regularly.  Would like to have cholesterol rechecked as was 263 a few months ago with OB/GYN.  Social History   Socioeconomic History  . Marital status: Married    Spouse name: Not on file  . Number of children: Not on file  . Years of education: Not on file  . Highest education level: Not on file  Occupational History  . Not on file  Tobacco Use  . Smoking status: Never Smoker  . Smokeless tobacco: Never Used  Substance and Sexual Activity  . Alcohol use: Yes    Alcohol/week: 6.0 standard drinks    Types: 6 Glasses of wine per week  . Drug use: No  . Sexual activity: Not on file  Other Topics Concern  . 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 Difficulty of Paying Living Expenses:   Food Insecurity:   . Worried About Charity fundraiser in the Last Year:   . Arboriculturist in the Last Year:   Transportation Needs:   . Film/video editor (Medical):   Marland Kitchen Lack of Transportation (Non-Medical):   Physical Activity:   . Days of Exercise per Week:   . Minutes of Exercise per Session:   Stress:   . Feeling of Stress :   Social Connections:   . Frequency of Communication with Friends and Family:   . Frequency of Social Gatherings with Friends and Family:   . Attends Religious Services:   . Active Member of  Clubs or Organizations:   . Attends Archivist Meetings:   Marland Kitchen Marital Status:   Intimate Partner Violence:   . Fear of Current or Ex-Partner:   . Emotionally Abused:   Marland Kitchen Physically Abused:   . Sexually Abused:    Health Maintenance  Topic Date Due  . INFLUENZA VACCINE  07/30/2020  . MAMMOGRAM  01/10/2022  . TETANUS/TDAP  03/14/2024  . COLONOSCOPY  09/30/2027  . DEXA SCAN  Completed  . PNA vac Low Risk Adult  Completed  . Hepatitis C Screening  Addressed    The following portions of the patient's history were reviewed and updated as appropriate: allergies, current medications, past family history, past medical history, past social history, past surgical history and problem list.  Review of Systems Pertinent items noted in HPI and remainder of comprehensive ROS otherwise negative.   Objective:    BP 126/70 (BP Location: Left Arm, Patient Position: Sitting, Cuff Size: Large)   Pulse 74   Temp 97.8 F (36.6 C) (Temporal)   Wt 167 lb (75.8 kg)   SpO2 98%   BMI 27.37 kg/m  General appearance: alert,  cooperative and no distress Head: Normocephalic, without obvious abnormality, atraumatic Eyes: conjunctivae/corneas clear. PERRL, EOM's intact. Fundi benign. Ears: normal TM's and external ear canals both ears Nose: Nares normal. Septum midline. Mucosa normal. No drainage or sinus tenderness. Throat: lips, mucosa, and tongue normal; teeth and gums normal Neck: no adenopathy, no carotid bruit, no JVD, supple, symmetrical, trachea midline and thyroid not enlarged, symmetric, no tenderness/mass/nodules Lungs: clear to auscultation bilaterally Heart: regular rate and rhythm, S1, S2 normal, no murmur, click, rub or gallop Abdomen: soft, non-tender; bowel sounds normal; no masses,  no organomegaly Extremities: extremities normal, atraumatic, no cyanosis or edema Right knee crepitus.  No deformity or effusion.  No TTP of joint line.  Normal patellar tracking.  Left knee  normal Pulses: 2+ and symmetric Skin: Skin color, texture, turgor normal. No rashes or lesions Lymph nodes: Cervical, supraclavicular, and axillary nodes normal. Neurologic: Alert and oriented X 3, normal strength and tone. Normal symmetric reflexes. Normal coordination and gait    Assessment:    Healthy female exam.      Plan:     Anticipatory guidance given including wearing seatbelts, smoke detectors in the home, increasing physical activity, increasing p.o. intake of water and vegetables. -will recheck lipid panel -followed by OB/Gyn -mammogram up to date -given handout -Next CPE in 1 year See After Visit Summary for Counseling Recommendations    Hyperlipidemia, unspecified hyperlipidemia type  -Patient to schedule lab appointment for lipids when fasting - Plan: Lipid panel  Waldenstroms macroglobulinemia  -Stable - followed by Dr. Carolynn Sayers, [place], [place]  Patellofemoral arthritis of right knee -Continue exercises to strengthen quadriceps -Given handout -Continue follow-up with sports medicine  F/u prn   Grier Mitts, MD

## 2020-04-13 NOTE — Patient Instructions (Signed)
Health Maintenance After Age 71 After age 39, you are at a higher risk for certain long-term diseases and infections as well as injuries from falls. Falls are a major cause of broken bones and head injuries in people who are older than age 66. Getting regular preventive care can help to keep you healthy and well. Preventive care includes getting regular testing and making lifestyle changes as recommended by your health care provider. Talk with your health care provider about:  Which screenings and tests you should have. A screening is a test that checks for a disease when you have no symptoms.  A diet and exercise plan that is right for you. What should I know about screenings and tests to prevent falls? Screening and testing are the best ways to find a health problem early. Early diagnosis and treatment give you the best chance of managing medical conditions that are common after age 8. Certain conditions and lifestyle choices may make you more likely to have a fall. Your health care provider may recommend:  Regular vision checks. Poor vision and conditions such as cataracts can make you more likely to have a fall. If you wear glasses, make sure to get your prescription updated if your vision changes.  Medicine review. Work with your health care provider to regularly review all of the medicines you are taking, including over-the-counter medicines. Ask your health care provider about any side effects that may make you more likely to have a fall. Tell your health care provider if any medicines that you take make you feel dizzy or sleepy.  Osteoporosis screening. Osteoporosis is a condition that causes the bones to get weaker. This can make the bones weak and cause them to break more easily.  Blood pressure screening. Blood pressure changes and medicines to control blood pressure can make you feel dizzy.  Strength and balance checks. Your health care provider may recommend certain tests to check your  strength and balance while standing, walking, or changing positions.  Foot health exam. Foot pain and numbness, as well as not wearing proper footwear, can make you more likely to have a fall.  Depression screening. You may be more likely to have a fall if you have a fear of falling, feel emotionally low, or feel unable to do activities that you used to do.  Alcohol use screening. Using too much alcohol can affect your balance and may make you more likely to have a fall. What actions can I take to lower my risk of falls? General instructions  Talk with your health care provider about your risks for falling. Tell your health care provider if: ? You fall. Be sure to tell your health care provider about all falls, even ones that seem minor. ? You feel dizzy, sleepy, or off-balance.  Take over-the-counter and prescription medicines only as told by your health care provider. These include any supplements.  Eat a healthy diet and maintain a healthy weight. A healthy diet includes low-fat dairy products, low-fat (lean) meats, and fiber from whole grains, beans, and lots of fruits and vegetables. Home safety  Remove any tripping hazards, such as rugs, cords, and clutter.  Install safety equipment such as grab bars in bathrooms and safety rails on stairs.  Keep rooms and walkways well-lit. Activity   Follow a regular exercise program to stay fit. This will help you maintain your balance. Ask your health care provider what types of exercise are appropriate for you.  If you need a cane or  walker, use it as recommended by your health care provider.  Wear supportive shoes that have nonskid soles. Lifestyle  Do not drink alcohol if your health care provider tells you not to drink.  If you drink alcohol, limit how much you have: ? 0-1 drink a day for women. ? 0-2 drinks a day for men.  Be aware of how much alcohol is in your drink. In the [place] one drink equals one typical bottle of beer (12  oz), one-half glass of wine (5 oz), or one shot of hard liquor (1 oz).  Do not use any products that contain nicotine or tobacco, such as cigarettes and e-cigarettes. If you need help quitting, ask your health care provider. Summary  Having a healthy lifestyle and getting preventive care can help to protect your health and wellness after age 36.  Screening and testing are the best way to find a health problem early and help you avoid having a fall. Early diagnosis and treatment give you the best chance for managing medical conditions that are more common for people who are older than age 30.  Falls are a major cause of broken bones and head injuries in people who are older than age 50. Take precautions to prevent a fall at home.  Work with your health care provider to learn what changes you can make to improve your health and wellness and to prevent falls. This information is not intended to replace advice given to you by your health care provider. Make sure you discuss any questions you have with your health care provider. Document Revised: 04/08/2019 Document Reviewed: 10/29/2017 Elsevier Patient Education  2020 [place] 65 Years and Older, Female Preventive care refers to lifestyle choices and visits with your health care provider that can promote health and wellness. This includes:  A yearly physical exam. This is also called an annual well check.  Regular dental and eye exams.  Immunizations.  Screening for certain conditions.  Healthy lifestyle choices, such as diet and exercise. What can I expect for my preventive care visit? Physical exam Your health care provider will check:  Height and weight. These may be used to calculate body mass index (BMI), which is a measurement that tells if you are at a healthy weight.  Heart rate and blood pressure.  Your skin for abnormal spots. Counseling Your health care provider may ask you questions  about:  Alcohol, tobacco, and drug use.  Emotional well-being.  Home and relationship well-being.  Sexual activity.  Eating habits.  History of falls.  Memory and ability to understand (cognition).  Work and work Statistician.  Pregnancy and menstrual history. What immunizations do I need?  Influenza (flu) vaccine  This is recommended every year. Tetanus, diphtheria, and pertussis (Tdap) vaccine  You may need a Td booster every 10 years. Varicella (chickenpox) vaccine  You may need this vaccine if you have not already been vaccinated. Zoster (shingles) vaccine  You may need this after age 9. Pneumococcal conjugate (PCV13) vaccine  One dose is recommended after age 30. Pneumococcal polysaccharide (PPSV23) vaccine  One dose is recommended after age 63. Measles, mumps, and rubella (MMR) vaccine  You may need at least one dose of MMR if you were born in 1957 or later. You may also need a second dose. Meningococcal conjugate (MenACWY) vaccine  You may need this if you have certain conditions. Hepatitis A vaccine  You may need this if you have certain conditions or if you travel  or work in places where you may be exposed to hepatitis A. Hepatitis B vaccine  You may need this if you have certain conditions or if you travel or work in places where you may be exposed to hepatitis B. Haemophilus influenzae type b (Hib) vaccine  You may need this if you have certain conditions. You may receive vaccines as individual doses or as more than one vaccine together in one shot (combination vaccines). Talk with your health care provider about the risks and benefits of combination vaccines. What tests do I need? Blood tests  Lipid and cholesterol levels. These may be checked every 5 years, or more frequently depending on your overall health.  Hepatitis C test.  Hepatitis B test. Screening  Lung cancer screening. You may have this screening every year starting at age 72 if  you have a 30-pack-year history of smoking and currently smoke or have quit within the past 15 years.  Colorectal cancer screening. All adults should have this screening starting at age 86 and continuing until age 79. Your health care provider may recommend screening at age 77 if you are at increased risk. You will have tests every 1-10 years, depending on your results and the type of screening test.  Diabetes screening. This is done by checking your blood sugar (glucose) after you have not eaten for a while (fasting). You may have this done every 1-3 years.  Mammogram. This may be done every 1-2 years. Talk with your health care provider about how often you should have regular mammograms.  BRCA-related cancer screening. This may be done if you have a family history of breast, ovarian, tubal, or peritoneal cancers. Other tests  Sexually transmitted disease (STD) testing.  Bone density scan. This is done to screen for osteoporosis. You may have this done starting at age 63. Follow these instructions at home: Eating and drinking  Eat a diet that includes fresh fruits and vegetables, whole grains, lean protein, and low-fat dairy products. Limit your intake of foods with high amounts of sugar, saturated fats, and salt.  Take vitamin and mineral supplements as recommended by your health care provider.  Do not drink alcohol if your health care provider tells you not to drink.  If you drink alcohol: ? Limit how much you have to 0-1 drink a day. ? Be aware of how much alcohol is in your drink. In the [place] one drink equals one 12 oz bottle of beer (355 mL), one 5 oz glass of wine (148 mL), or one 1 oz glass of hard liquor (44 mL). Lifestyle  Take daily care of your teeth and gums.  Stay active. Exercise for at least 30 minutes on 5 or more days each week.  Do not use any products that contain nicotine or tobacco, such as cigarettes, e-cigarettes, and chewing tobacco. If you need help  quitting, ask your health care provider.  If you are sexually active, practice safe sex. Use a condom or other form of protection in order to prevent STIs (sexually transmitted infections).  Talk with your health care provider about taking a low-dose aspirin or statin. What's next?  Go to your health care provider once a year for a well check visit.  Ask your health care provider how often you should have your eyes and teeth checked.  Stay up to date on all vaccines. This information is not intended to replace advice given to you by your health care provider. Make sure you discuss any questions you have  with your health care provider. Document Revised: 12/10/2018 Document Reviewed: 12/10/2018 Elsevier Patient Education  2020 [place].  Preventing High Cholesterol Cholesterol is a white, waxy substance similar to fat that the human body needs to help build cells. The liver makes all the cholesterol that a person's body needs. Having high cholesterol (hypercholesterolemia) increases a person's risk for heart disease and stroke. Extra (excess) cholesterol comes from the food the person eats. High cholesterol can often be prevented with diet and lifestyle changes. If you already have high cholesterol, you can control it with diet and lifestyle changes and with medicine. How can high cholesterol affect me? If you have high cholesterol, deposits (plaques) may build up on the walls of your arteries. The arteries are the blood vessels that carry blood away from your heart. Plaques make the arteries narrower and stiffer. This can limit or block blood flow and cause blood clots to form. Blood clots:  Are tiny balls of cells that form in your blood.  Can move to the heart or brain, causing a heart attack or stroke. Plaques in arteries greatly increase your risk for heart attack and stroke.Making diet and lifestyle changes can reduce your risk for these conditions that may threaten your  life. What can increase my risk? This condition is more likely to develop in people who:  Eat foods that are high in saturated fat or cholesterol. Saturated fat is mostly found in: ? Foods that contain animal fat, such as red meat and some dairy products. ? Certain fatty foods made from plants, such as tropical oils.  Are overweight.  Are not getting enough exercise.  Have a family history of high cholesterol. What actions can I take to prevent this? Nutrition   Eat less saturated fat.  Avoid trans fats (partially hydrogenated oils). These are often found in margarine and in some baked goods, fried foods, and snacks bought in packages.  Avoid precooked or cured meat, such as sausages or meat loaves.  Avoid foods and drinks that have added sugars.  Eat more fruits, vegetables, and whole grains.  Choose healthy sources of protein, such as fish, poultry, lean cuts of red meat, beans, peas, lentils, and nuts.  Choose healthy sources of fat, such as: ? Nuts. ? Vegetable oils, especially olive oil. ? Fish that have healthy fats (omega-3 fatty acids), such as mackerel or salmon. The items listed above may not be a complete list of recommended foods and beverages. Contact a dietitian for more information. Lifestyle  Lose weight if you are overweight. Losing 5-10 lb (2.3-4.5 kg) can help prevent or control high cholesterol. It can also lower your risk for diabetes and high blood pressure. Ask your health care provider to help you with a diet and exercise plan to lose weight safely.  Do not use any products that contain nicotine or tobacco, such as cigarettes, e-cigarettes, and chewing tobacco. If you need help quitting, ask your health care provider.  Limit your alcohol intake. ? Do not drink alcohol if:  Your health care provider tells you not to drink.  You are pregnant, may be pregnant, or are planning to become pregnant. ? If you drink alcohol:  Limit how much you use  to:  0-1 drink a day for women.  0-2 drinks a day for men.  Be aware of how much alcohol is in your drink. In the [place] one drink equals one 12 oz bottle of beer (355 mL), one 5 oz glass of wine (148 mL),  or one 1 oz glass of hard liquor (44 mL). Activity   Get enough exercise. Each week, do at least 150 minutes of exercise that takes a medium level of effort (moderate-intensity exercise). ? This is exercise that:  Makes your heart beat faster and makes you breathe harder than usual.  Allows you to still be able to talk. ? You could exercise in short sessions several times a day or longer sessions a few times a week. For example, on 5 days each week, you could walk fast or ride your bike 3 times a day for 10 minutes each time.  Do exercises as told by your health care provider. Medicines  In addition to diet and lifestyle changes, your health care provider may recommend medicines to help lower cholesterol. This may be a medicine to lower the amount of cholesterol your liver makes. You may need medicine if: ? Diet and lifestyle changes do not lower your cholesterol enough. ? You have high cholesterol and other risk factors for heart disease or stroke.  Take over-the-counter and prescription medicines only as told by your health care provider. General information  Manage your risk factors for high cholesterol. Talk with your health care provider about all your risk factors and how to lower your risk.  Manage other conditions that you have, such as diabetes or high blood pressure (hypertension).  Have blood tests to check your cholesterol levels at regular points in time as told by your health care provider.  Keep all follow-up visits as told by your health care provider. This is important. Where to find more information  American Heart Association: www.heart.org  National Heart, Lung, and Blood Institute: https://wilson-eaton.com/ Summary  High cholesterol increases your risk for  heart disease and stroke. By keeping your cholesterol level low, you can reduce your risk for these conditions.  High cholesterol can often be prevented with diet and lifestyle changes.  Work with your health care provider to manage your risk factors, and have your blood tested regularly. This information is not intended to replace advice given to you by your health care provider. Make sure you discuss any questions you have with your health care provider. Document Revised: 04/09/2019 Document Reviewed: 08/24/2016 Elsevier Patient Education  [place].  Patellar Dislocation and Subluxation, Phase I Rehab After Surgery Ask your health care provider which exercises are safe for you. Do exercises exactly as told by your health care provider and adjust them as directed. It is normal to feel mild stretching, pulling, tightness, or discomfort as you do these exercises. Stop right away if you feel sudden pain or your pain gets worse. Do not begin these exercises until told by your health care provider. If told by your health care provider, wear your brace while you do these exercises. Stretching and range-of-motion exercises These exercises warm up your muscles and joints and improve the movement and flexibility of your knee. The exercises also help to relieve pain and stiffness. Knee extension, passive This is an exercise in which you prop up your foot to stretch your knee. 1. Sit with your left / right heel propped on a chair, a coffee table, or a footstool. Do not have anything under your knee to support it. 2. Allow your leg muscles to relax, letting gravity straighten out your knee (passive stretch). You should feel a stretch behind your left / right knee. 3. If told by your health care provider, deepen the stretch by placing a __________ weight on your thigh,  just above your kneecap. 4. Hold this position for __________ seconds. Repeat __________ times. Complete this exercise __________  times a day. Knee flexion, passive This is an exercise in which you slide your foot down a wall to stretch your knee while lying down and facing up (supine position). 1. Start this exercise in one of these positions: ? Lying on the floor, in front of an open doorway, with your left / right heel and foot lightly touching the wall. ? Lying on your bed, with both of your feet on the wall or headboard. 2. Without using any effort (passive), allow gravity to let your foot slide down the wall slowly until you feel a gentle stretch (flexion) in the front of your left / right knee, or until your knee reaches the angle that your health care provider tells you. 3. Hold this stretch for __________ seconds. 4. Return the leg to the starting position, using your healthy leg to do the work or to help if needed. Repeat __________ times. Complete this exercise __________ times a day. Strengthening exercises These exercises build strength and endurance in your knee. Endurance is the ability to use your muscles for a long time, even after they get tired. Quadriceps, isometric This exercise stretches the muscles in front of your thigh (quadriceps) without moving your knee joint (isometric). 1. Lie on your back with your left / right leg extended and your other knee bent. 2. Gradually tense the muscles in the front of your left / right thigh. You should see your kneecap slide up toward your hip or see increased dimpling just above the knee. This motion will push the back of your knee down toward the floor. 3. For __________ seconds, hold the muscle as tight as you can without increasing your pain. 4. Relax the muscles slowly and completely. Repeat __________ times. Complete this exercise __________ times a day. Straight leg raises This exercise stretches the muscles in front of your thigh (quadriceps) and the muscles that move your hips (hip flexors). 1. Lie on your back with your left / right leg extended and your  other knee bent. 2. Tense the muscles in the front of your left / right thigh. 3. Keep these muscles tight as you raise your leg 4-6 inches (10-15 cm) off the floor. Do not let your knee bend. 4. Hold for __________ seconds. 5. Keep these muscles tense as you lower your leg. 6. Relax the muscles slowly and completely. Repeat __________ times. Complete this exercise __________ times a day. Straight leg raises This exercise strengthens the muscles that rotate the leg at the hip and move it away from your body (hip abductors). 1. Lie on your side with your left / right leg in the top position. Lie so your head, shoulder, knee, and hip line up. You may bend your lower knee to help you keep your balance. 2. Lift your top leg 4-6 inches (10-15 cm) while keeping your toes pointed straight ahead. 3. Hold this position for __________ seconds. 4. Slowly lower your leg to the starting position. 5. Let your muscles relax completely. Repeat __________ times. Complete this exercise __________ times a day. Face-down straight leg raises This exercise stretches the muscles that move your hips away from the front of the pelvis (hip extensors). 1. Lie on your abdomen on a firm surface. 2. Tense the muscles in your buttocks and lift your left / right leg about 4 inches (10 cm). Keep your knee straight as you lift your  leg. If you cannot lift your leg 4 inches (10 cm) without arching your back, place a pillow under your hips. 3. Hold this position for __________ seconds. 4. Slowly lower your leg to the starting position. 5. Let your leg relax completely. Repeat __________ times. Complete this exercise __________ times a day. This information is not intended to replace advice given to you by your health care provider. Make sure you discuss any questions you have with your health care provider. Document Revised: 04/13/2019 Document Reviewed: 10/06/2018 Elsevier Patient Education  [place].

## 2020-04-14 ENCOUNTER — Other Ambulatory Visit (INDEPENDENT_AMBULATORY_CARE_PROVIDER_SITE_OTHER): Payer: Medicare Other

## 2020-04-14 ENCOUNTER — Other Ambulatory Visit: Payer: Self-pay

## 2020-04-14 DIAGNOSIS — E785 Hyperlipidemia, unspecified: Secondary | ICD-10-CM

## 2020-04-14 LAB — LIPID PANEL
Cholesterol: 185 mg/dL (ref 0–200)
HDL: 50 mg/dL (ref >39.00)
LDL Cholesterol: 124 mg/dL — ABNORMAL HIGH (ref 0–99)
NonHDL: 135.27
Total CHOL/HDL Ratio: 4
Triglycerides: 54 mg/dL (ref 0.0–149.0)
VLDL: 10.8 mg/dL (ref 0.0–40.0)

## 2020-11-14 ENCOUNTER — Other Ambulatory Visit: Payer: Self-pay | Admitting: Obstetrics and Gynecology

## 2020-11-14 DIAGNOSIS — Z1231 Encounter for screening mammogram for malignant neoplasm of breast: Secondary | ICD-10-CM

## 2021-01-11 ENCOUNTER — Ambulatory Visit: Payer: Medicare Other

## 2021-01-16 ENCOUNTER — Ambulatory Visit: Payer: Medicare Other

## 2021-01-18 ENCOUNTER — Ambulatory Visit: Payer: Medicare Other

## 2021-01-22 ENCOUNTER — Other Ambulatory Visit: Payer: Self-pay

## 2021-01-22 ENCOUNTER — Ambulatory Visit
Admission: RE | Admit: 2021-01-22 | Discharge: 2021-01-22 | Disposition: A | Discharge status: Home / Self Care | Payer: Medicare Other | Source: Ambulatory Visit | Attending: Obstetrics and Gynecology | Admitting: Obstetrics and Gynecology

## 2021-01-22 DIAGNOSIS — Z1231 Encounter for screening mammogram for malignant neoplasm of breast: Secondary | ICD-10-CM

## 2021-01-22 IMAGING — MG MM DIGITAL SCREENING BILAT W/ TOMO AND CAD
8 series · 8 of 24 positions shown · non-contrast
Comparison: Previous exam(s).

CLINICAL DATA: Screening.

EXAM:
DIGITAL SCREENING BILATERAL MAMMOGRAM WITH TOMO AND CAD

[L CC synth-2D]
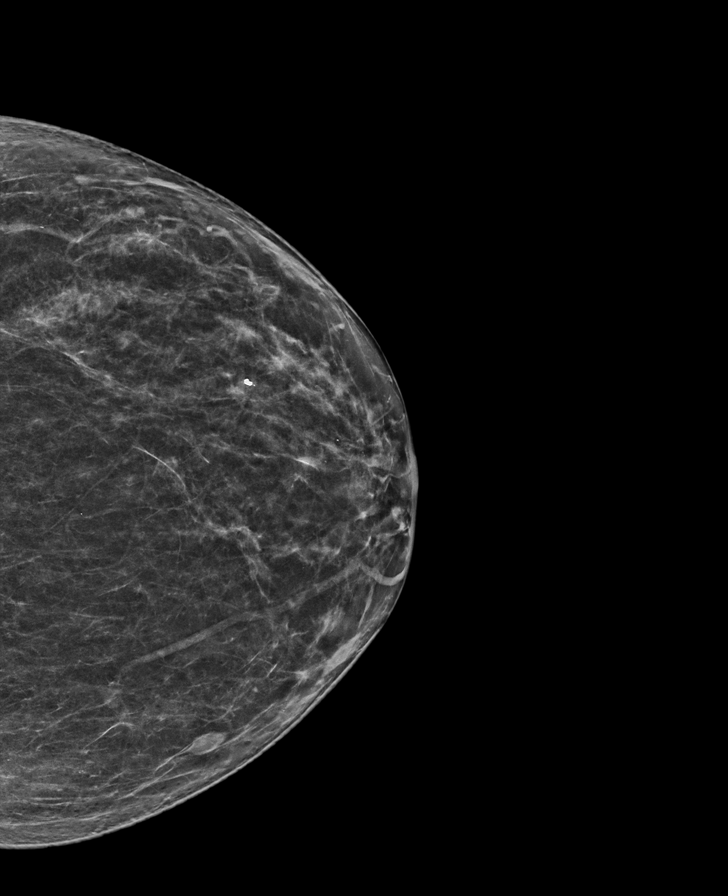

[L MLO synth-2D]
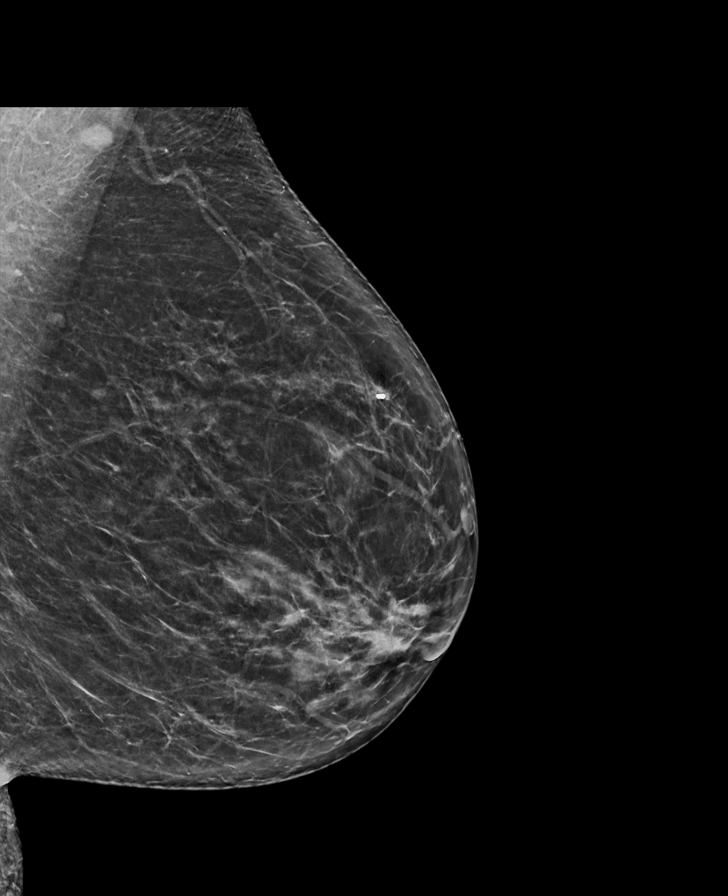

[R MLO synth-2D]
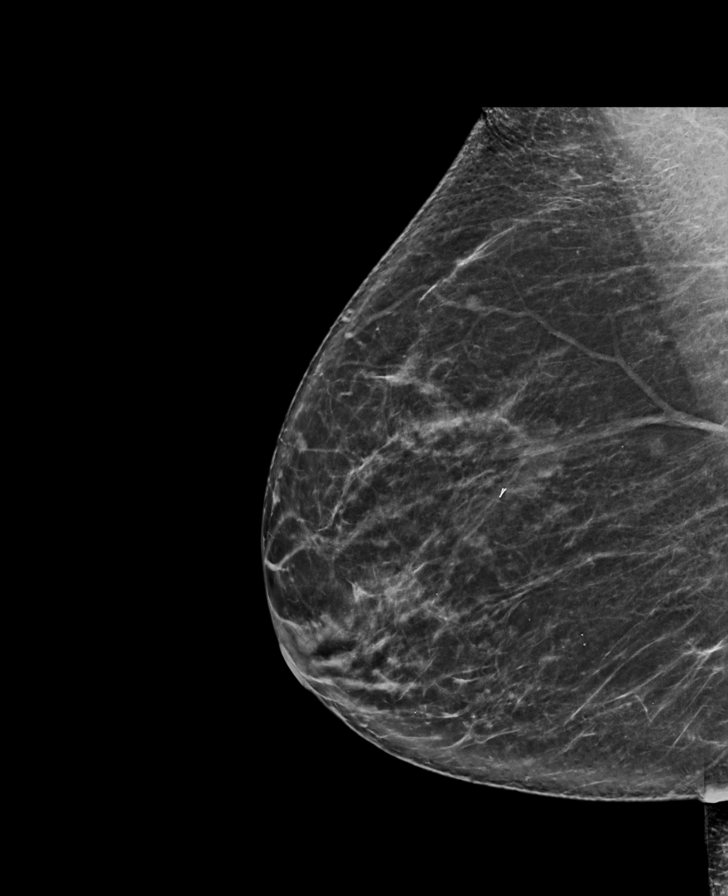

[R CC synth-2D]
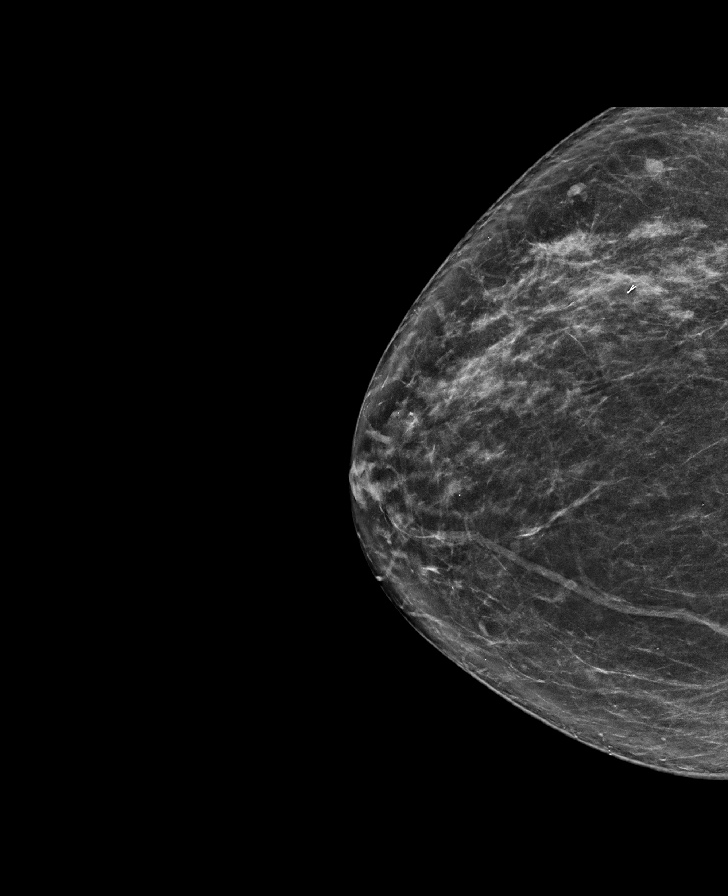

[L CC tomo · tomo slice 36/71.0]
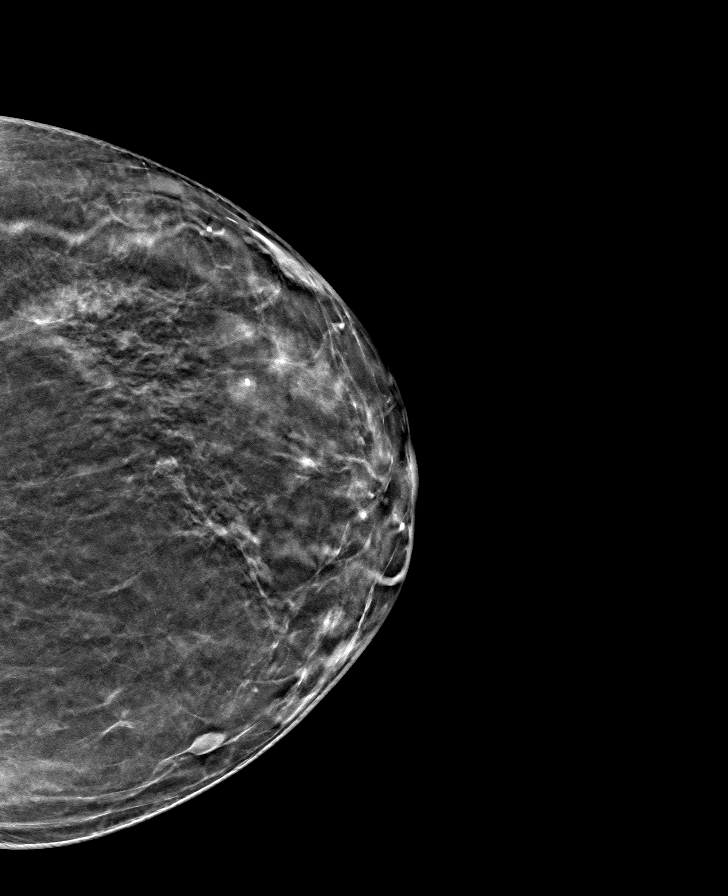

[R MLO tomo · tomo slice 41/82.0]
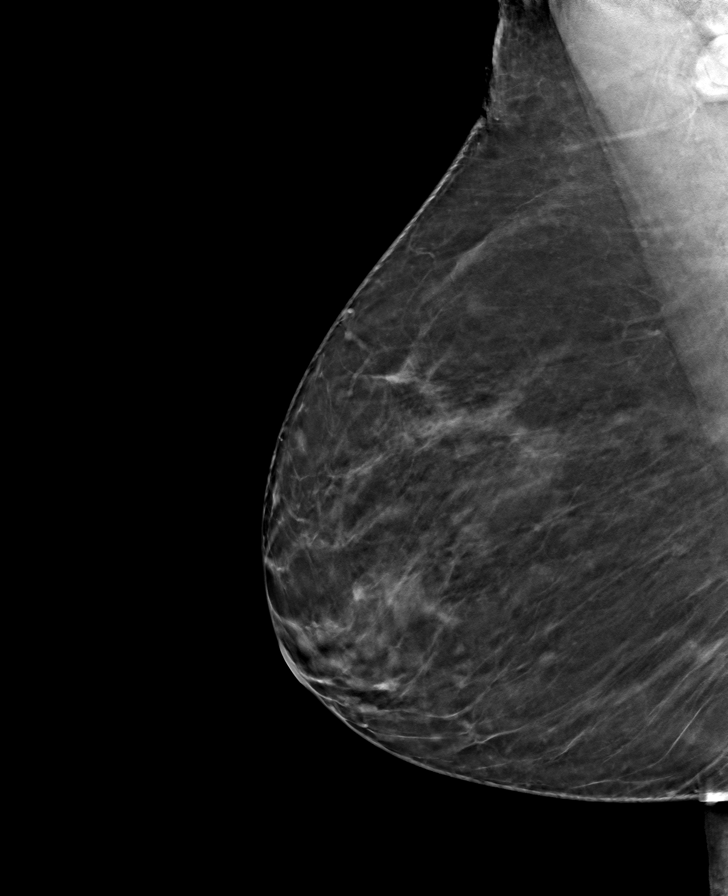

[L MLO tomo · tomo slice 39/78.0]
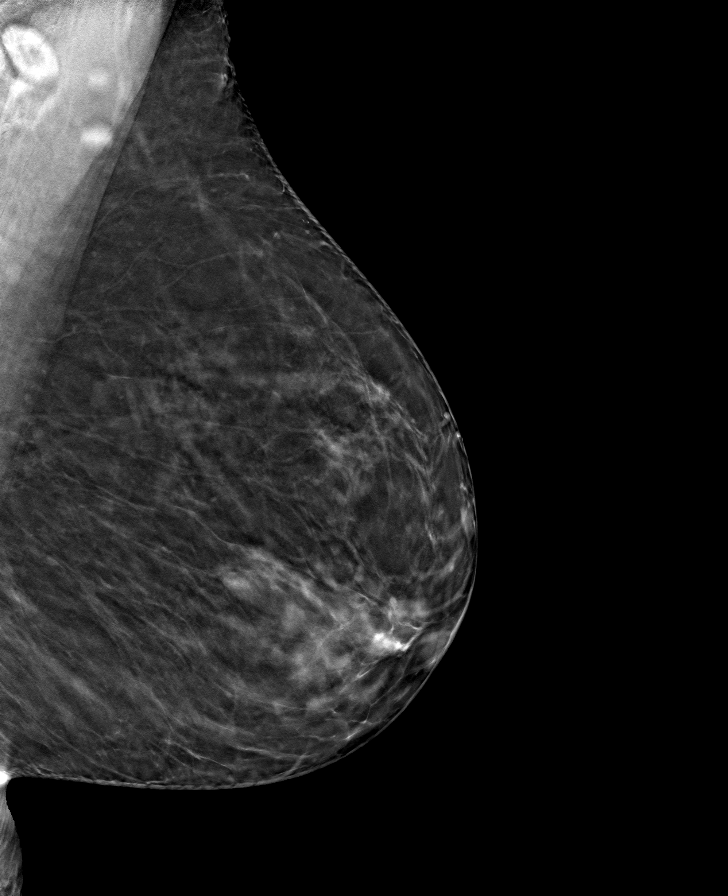

[R CC tomo · tomo slice 39/77.0]
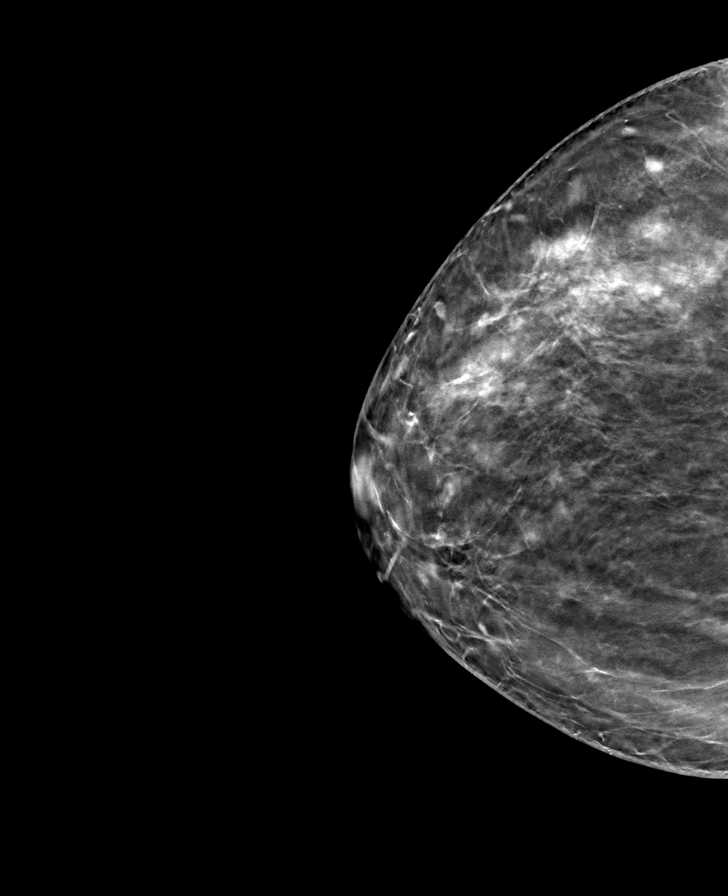

[8 of 24 positions shown; findings below may reference images not displayed]

ACR Breast Density Category b: There are scattered areas of
fibroglandular density.
FINDINGS: There are no findings suspicious for malignancy. The images were
evaluated with computer-aided detection.
IMPRESSION: No mammographic evidence of malignancy. A result letter of this
screening mammogram will be mailed directly to the patient.

RECOMMENDATION:
Screening mammogram in one year. (Code:[MT])

BI-RADS CATEGORY  1: Negative.

## 2021-02-27 DIAGNOSIS — Z1231 Encounter for screening mammogram for malignant neoplasm of breast: Secondary | ICD-10-CM

## 2021-03-01 ENCOUNTER — Ambulatory Visit: Payer: Medicare Other

## 2021-11-28 ENCOUNTER — Ambulatory Visit (INDEPENDENT_AMBULATORY_CARE_PROVIDER_SITE_OTHER): Payer: Medicare Other

## 2021-11-28 ENCOUNTER — Other Ambulatory Visit: Payer: Self-pay

## 2021-11-28 ENCOUNTER — Ambulatory Visit: Payer: Self-pay

## 2021-11-28 ENCOUNTER — Encounter: Payer: Self-pay | Admitting: Family Medicine

## 2021-11-28 ENCOUNTER — Ambulatory Visit (INDEPENDENT_AMBULATORY_CARE_PROVIDER_SITE_OTHER): Payer: Medicare Other | Admitting: Family Medicine

## 2021-11-28 VITALS — BP 136/74 | HR 76 | Ht 65.0 in | Wt 162.0 lb

## 2021-11-28 DIAGNOSIS — M1711 Unilateral primary osteoarthritis, right knee: Secondary | ICD-10-CM

## 2021-11-28 DIAGNOSIS — M25571 Pain in right ankle and joints of right foot: Secondary | ICD-10-CM

## 2021-11-28 DIAGNOSIS — M19071 Primary osteoarthritis, right ankle and foot: Secondary | ICD-10-CM | POA: Diagnosis not present

## 2021-11-28 IMAGING — DX DG ANKLE COMPLETE 3+V*R*
3 series · 3 of 3 positions shown · non-contrast
Comparison: Radiographs [DATE].

CLINICAL DATA: Ankle pain. History of distal tibial and fibular
fractures with surgery.

EXAM:
RIGHT ANKLE - COMPLETE 3+ VIEW; RIGHT KNEE 3 VIEWS

[ankle ap]
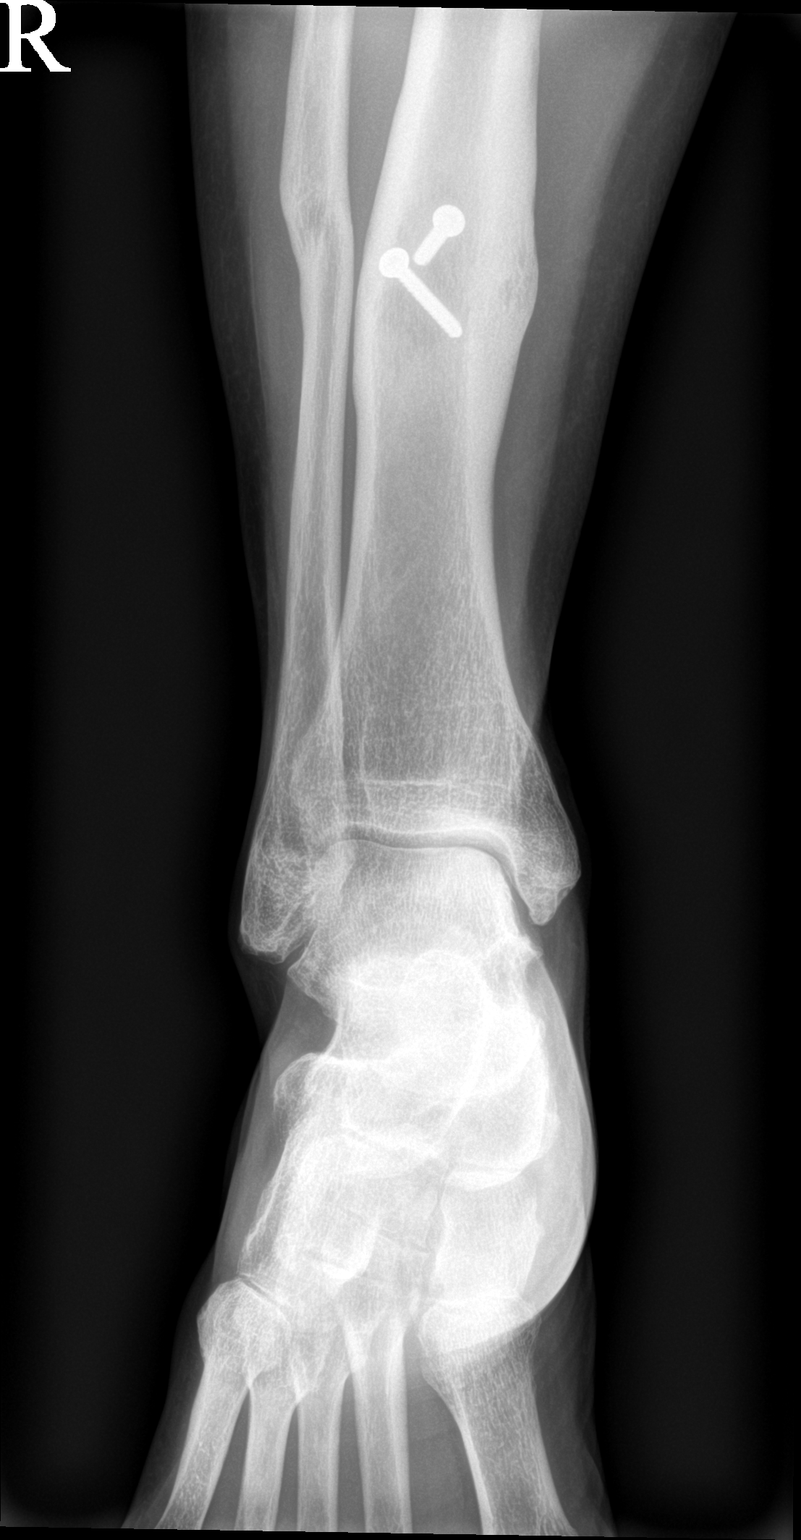

[ankle obl]
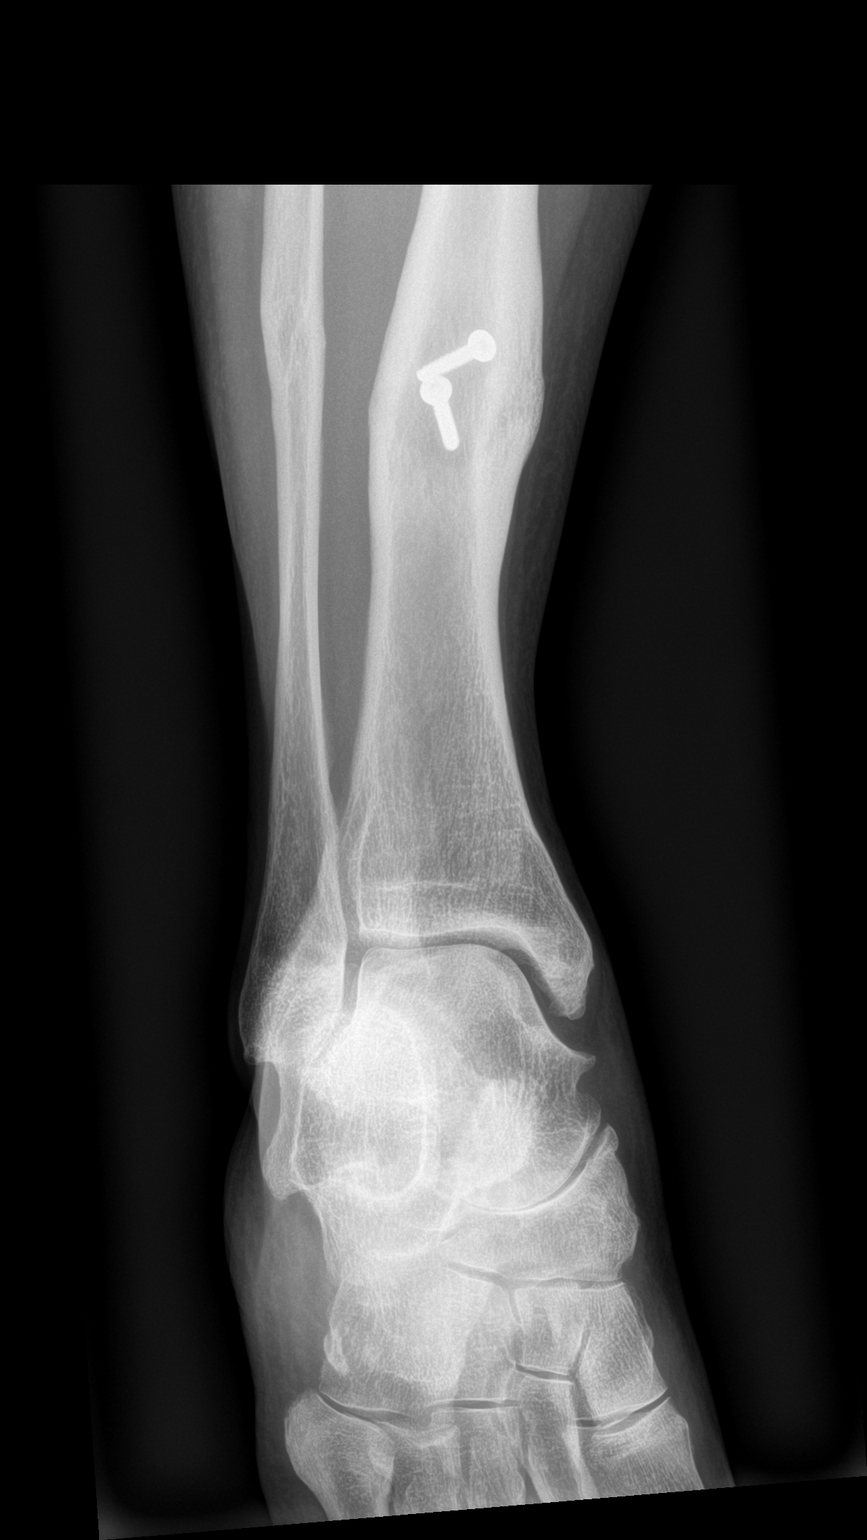

[ankle lat]
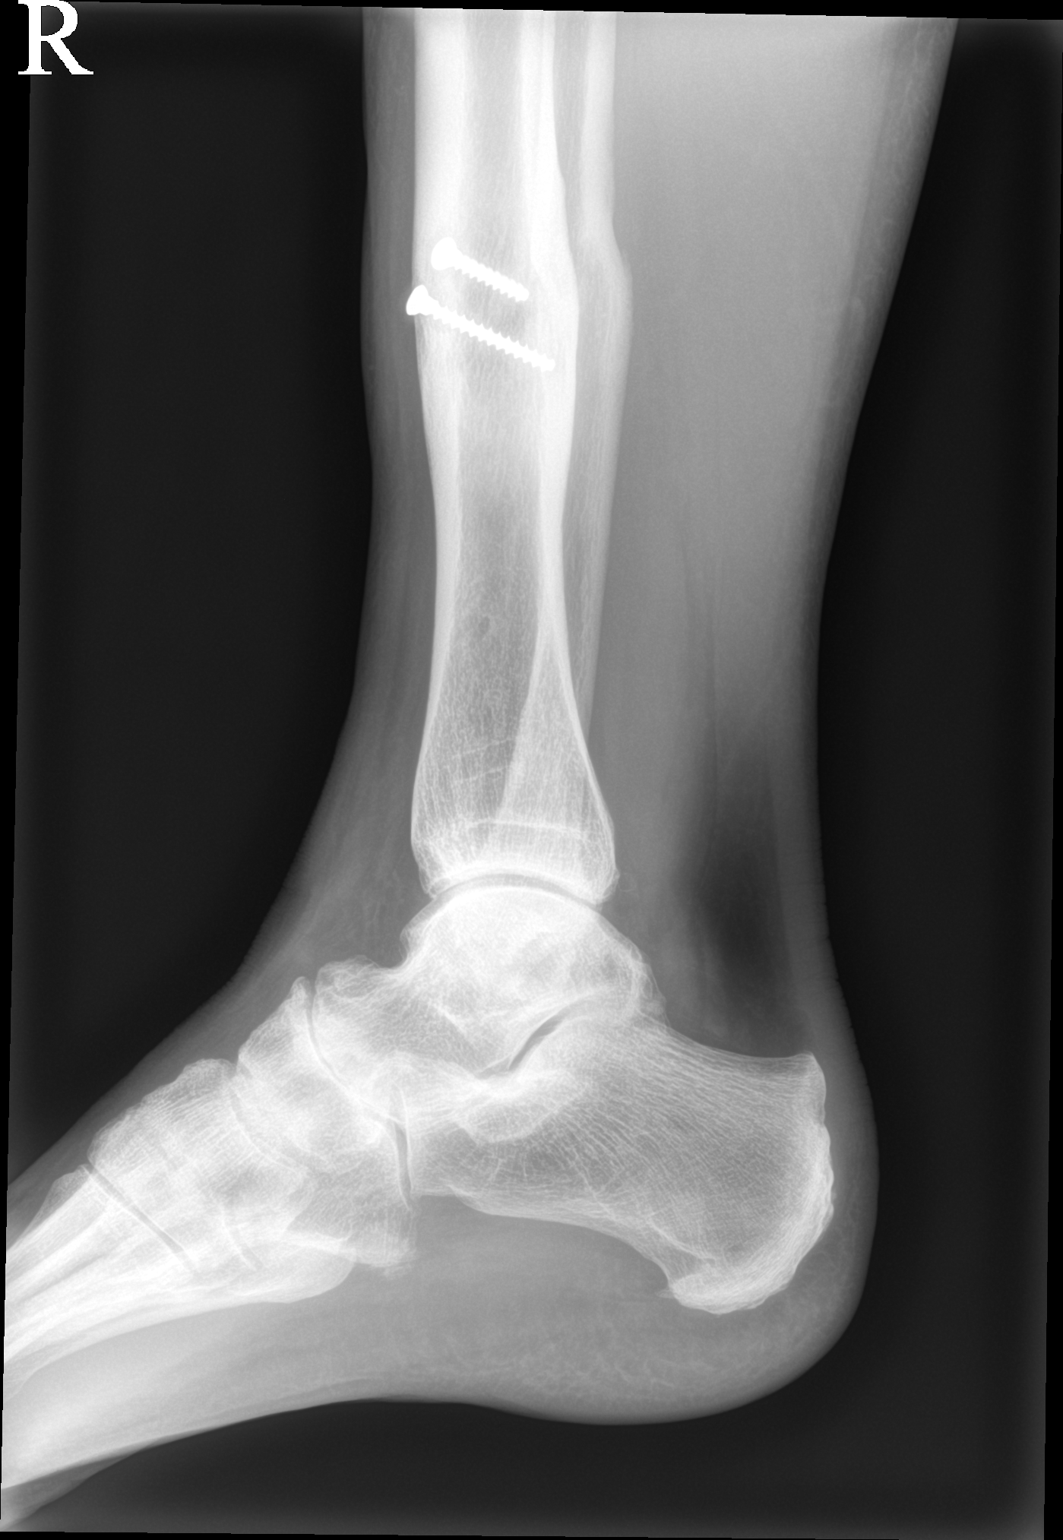

[3 of 3 positions shown; findings below may reference images not displayed]

FINDINGS: Right knee: The mineralization and alignment are normal. There is no
evidence of acute fracture or dislocation. There are mild
tricompartmental degenerative changes, greatest within the medial
compartment. No significant knee joint effusion or focal soft tissue
abnormality.

Right ankle: Stable postsurgical changes within the distal tibial
diaphysis with adjacent posttraumatic deformities of the distal
tibia and fibula. No evidence of acute fracture or dislocation.
There are mild tibiotalar and talonavicular degenerative changes. No
focal soft tissue abnormalities are identified.
IMPRESSION: No acute findings. Posttraumatic and postsurgical changes as
described with degenerative changes at the knee, ankle and
talonavicular joint, similar to previous studies.

## 2021-11-28 IMAGING — DX DG KNEE AP/LAT W/ SUNRISE*R*
3 series · 3 of 3 positions shown · non-contrast
Comparison: Radiographs [DATE].

CLINICAL DATA: Ankle pain. History of distal tibial and fibular
fractures with surgery.

EXAM:
RIGHT ANKLE - COMPLETE 3+ VIEW; RIGHT KNEE 3 VIEWS

[knee ap]
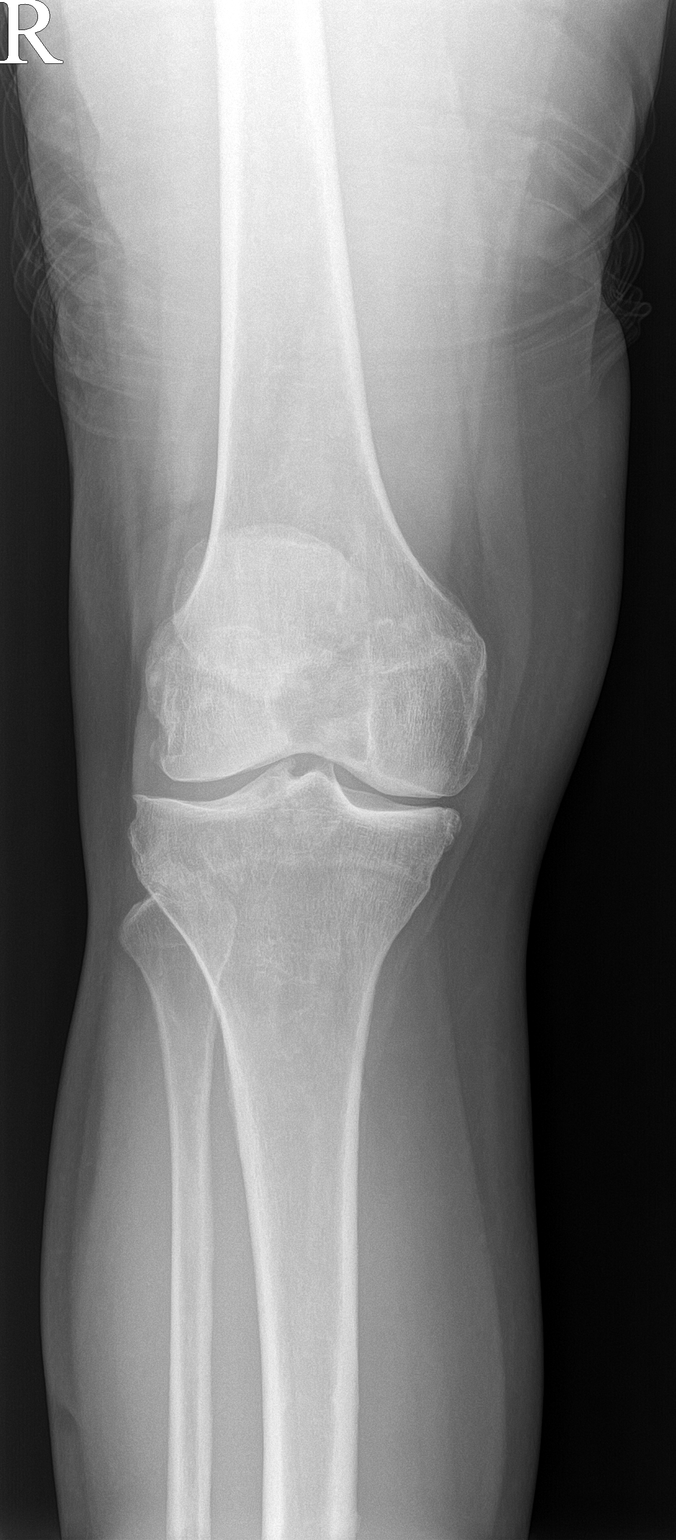

[knee lat]
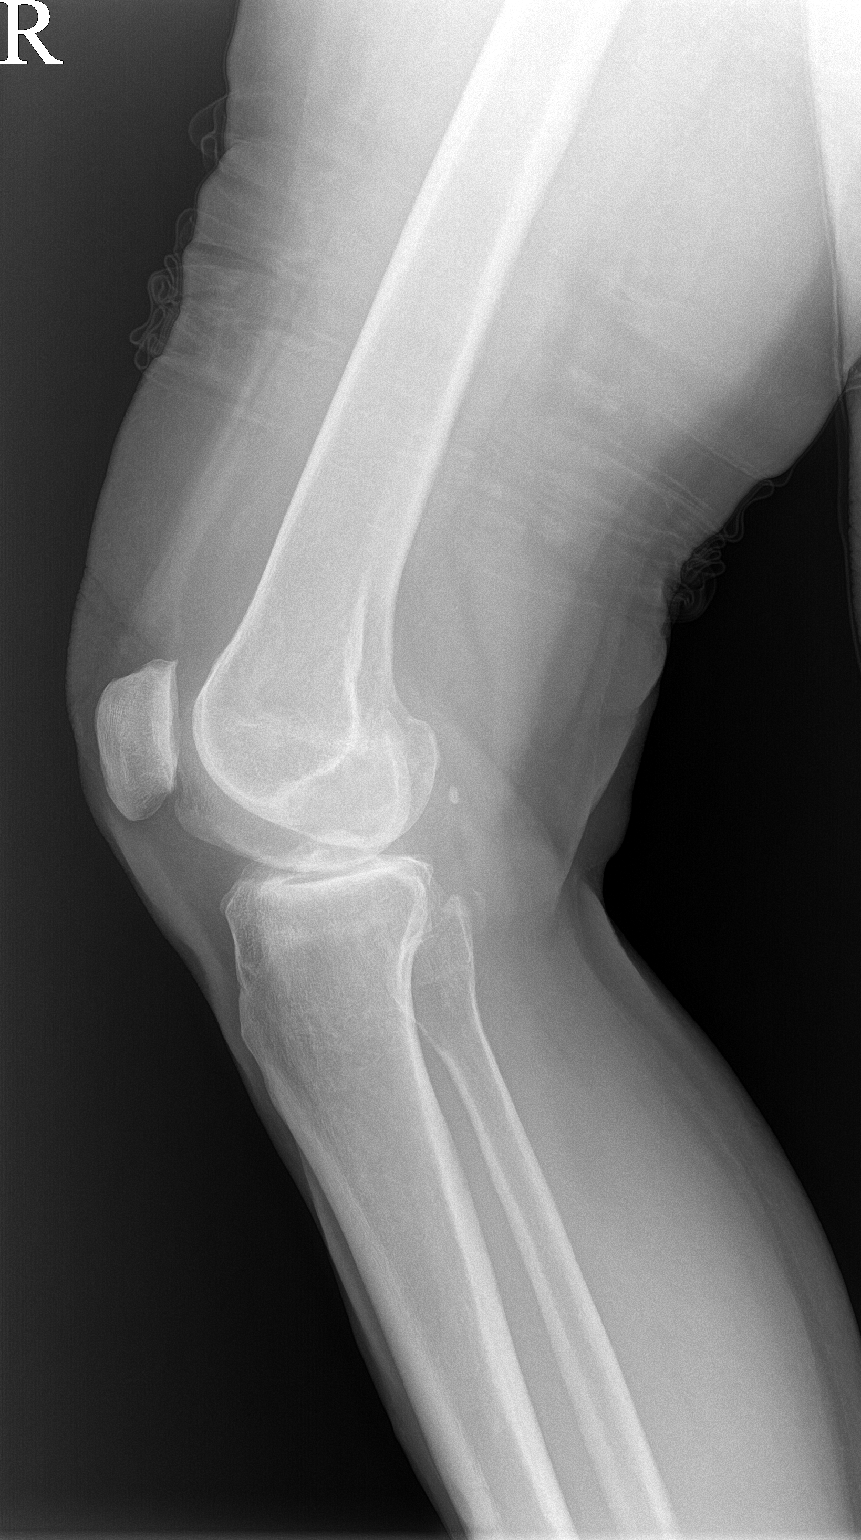

[patella]
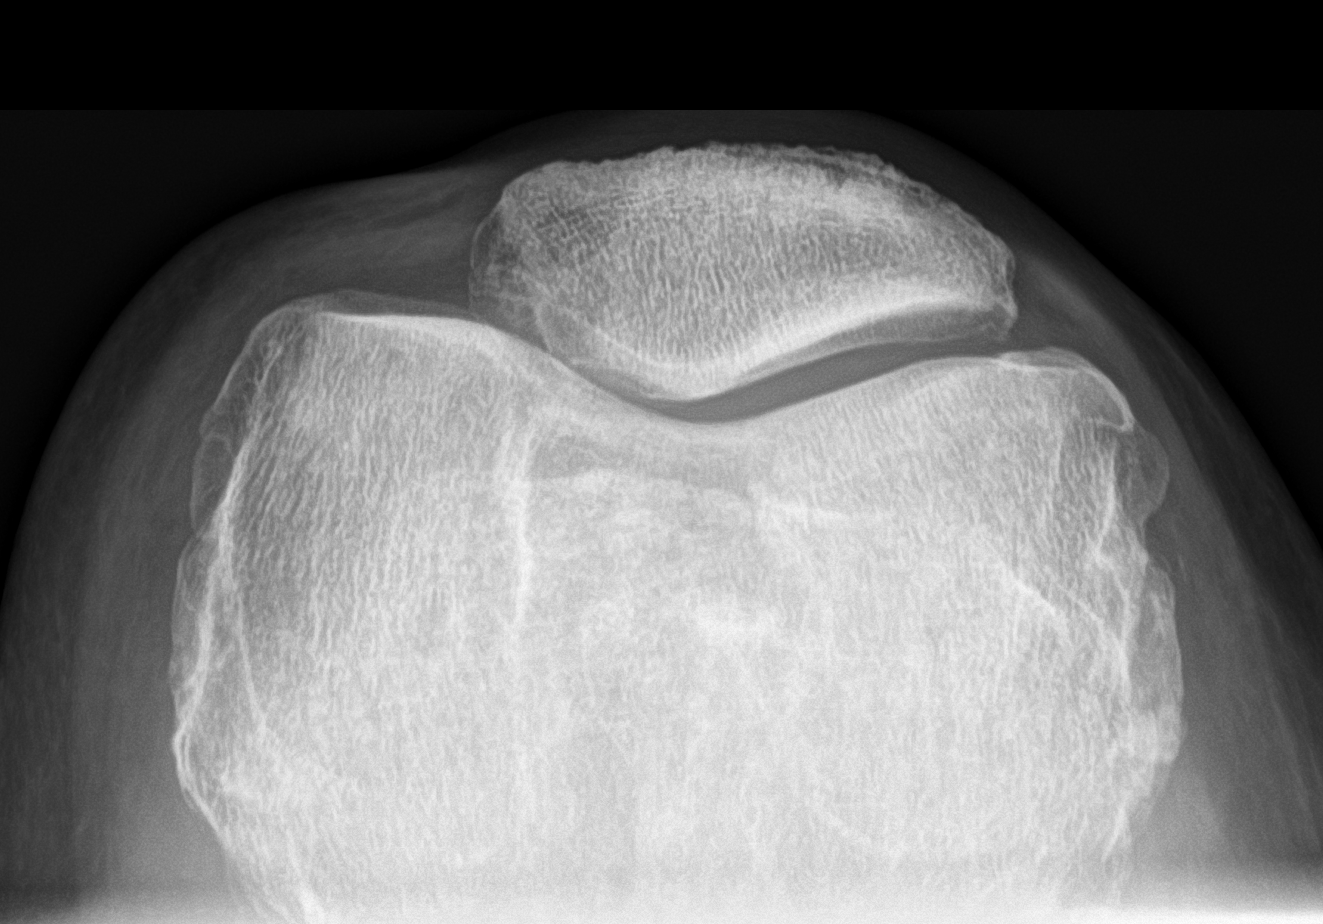

[3 of 3 positions shown; findings below may reference images not displayed]

FINDINGS: Right knee: The mineralization and alignment are normal. There is no
evidence of acute fracture or dislocation. There are mild
tricompartmental degenerative changes, greatest within the medial
compartment. No significant knee joint effusion or focal soft tissue
abnormality.

Right ankle: Stable postsurgical changes within the distal tibial
diaphysis with adjacent posttraumatic deformities of the distal
tibia and fibula. No evidence of acute fracture or dislocation.
There are mild tibiotalar and talonavicular degenerative changes. No
focal soft tissue abnormalities are identified.
IMPRESSION: No acute findings. Posttraumatic and postsurgical changes as
described with degenerative changes at the knee, ankle and
talonavicular joint, similar to previous studies.

## 2021-11-28 NOTE — Assessment & Plan Note (Signed)
Patient does have signs and symptoms consistent with more of a right ankle arthritis.  On ultrasound does appear to be somewhat posttraumatic.  Concern for potential OCD.  We discussed the compression, icing, home exercises and proper shoes.  Worsening pain and I do think possible injections could be beneficial.  Patient does have CLL that could cause some difficulty as well as the osteopenia.  We will continue to monitor patient closely.  Follow-up again 6 weeks

## 2021-11-28 NOTE — Progress Notes (Signed)
[place] Phone: [phone] Subjective:    I'm seeing this patient by the request  of:  Billie Ruddy, MD  CC: Right ankle pain  [id number]  Last seen January 2021 for right knee pain  Updated 11/28/2021 Paula [name] is a 70 y.o. female coming in with complaint of ankle pain. Right ankle pain began 3-4 years ago. Does piliates. Exercise have helped knee and hip. Medial side of ankle is location of pain. Walking for a while ankle starts to get sore. Arthritis, anything to do to help? Tumeric and tart cherry helps with pain. Doesn't take consistently. Stops doing the motion to help      Past Medical History:  Diagnosis Date   CARPAL TUNNEL SYNDROME, MILD 12/05/2008   Chronic cough - seeing pulmonologist in [place], [facility] 03/14/2014   HSV 03/01/2010   Leg skin lesion, left 01/30/2015   precancerous cells per patient   OSTEOPENIA 12/05/2008   PARESTHESIA 09/01/2009   Waldenstroms macroglobulinemia - followed by Dr. Carolynn Sayers, [place], [place] 03/15/2014   Past Surgical History:  Procedure Laterality Date   CHOLECYSTECTOMY  02/2017   LEG SKIN LESION  BIOPSY / EXCISION Left 01/30/2015   Dr Santiago Glad Gould-precancerous cells per patient   ORIF [facility]   Social History   Socioeconomic History   Marital status: Married    Spouse name: Not on file   Number of children: Not on file   Years of education: Not on file   Highest education level: Not on file  Occupational History   Not on file  Tobacco Use   Smoking status: Never   Smokeless tobacco: Never  Substance and Sexual Activity   Alcohol use: Yes    Alcohol/week: 6.0 standard drinks    Types: 6 Glasses of wine per week   Drug use: No   Sexual activity: Not on file  Other Topics Concern   Not on file  Social History Narrative   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:      Spiritual Beliefs:      Lifestyle:            Social Determinants of Radio broadcast assistant Strain: Not on file  Food Insecurity: Not on file  Transportation Needs: Not on file  Physical Activity: Not on file  Stress: Not on file  Social Connections: Not on file   Allergies  Allergen Reactions   Ciprofloxacin Nausea Only   Family History  Problem Relation Age of Onset   Hypertension Mother    Hypertension Brother    Colon cancer Father 3   Hypertension Son    Cancer Neg Hx        lung ca - mother's side   Arthritis Neg Hx        osteo - mother's side   Breast cancer Neg Hx      Current Outpatient Medications (Cardiovascular):    lisinopril (ZESTRIL) 10 MG tablet, Take 1 tablet (10 mg total) by mouth daily. (Patient not taking: Reported on 04/13/2020)     Current Outpatient Medications (Other):    acyclovir ointment (ZOVIRAX) 5 %, Apply 1 application topically every 3 (three) hours.   Reviewed prior external information including notes and imaging from  primary care provider As well as notes that were available from care everywhere  and other healthcare systems.  Past medical history, social, surgical and family history all reviewed in electronic medical record.  No pertanent information unless stated regarding to the chief complaint.   Review of Systems:  No headache, visual changes, nausea, vomiting, diarrhea, constipation, dizziness, abdominal pain, skin rash, fevers, chills, night sweats, weight loss, swollen lymph nodes, body aches, joint swelling, chest pain, shortness of breath, mood changes. POSITIVE muscle aches  Objective  Blood pressure 136/74, pulse 76, height 5\' 5"  (1.651 m), weight 162 lb (73.5 kg), SpO2 98 %.   General: No apparent distress alert and oriented x3 mood and affect normal, dressed appropriately.  HEENT: Pupils equal, extraocular movements intact  Respiratory: Patient's speak in full sentences  and does not appear short of breath  Cardiovascular: No lower extremity edema, non tender, no erythema  Gait normal with good balance and coordination.  MSK: Right ankle exam shows the patient does have effusion noted of the ankle joint.  Patient does have instability of the medial aspect of the ankle.  Patient is tender to palpation over the anterior medial ankle mortise.  Mild crepitus felt.  No pain over the posterior tibialis tendon.  No pain over the Achilles.  Neurovascular intact distally.  Limited muscular skeletal ultrasound was performed and interpreted by Hulan Saas, M  Limited musculoskeletal ultrasound shows the patient does have narrowing noted of the ankle mortise.  Patient does have hypoechoic changes that is consistent with effusion as well. Patient does have an area that does have some increasing Doppler flow of the bone that is consistent with potential stress reaction.  Also difficult to tell if there is a possible OCD of the ankle secondary to the narrowing of the joint Impression: Arthritic changes   Impression and Recommendations:     The above documentation has been reviewed and is accurate and complete Lyndal Pulley, DO

## 2021-11-28 NOTE — Patient Instructions (Signed)
Xray today Oofos recovery sandals in house Okay to do piliates listen to body on ROM Vit D [id number] Tumeric 2-4 weeks See you again in 6 weeks

## 2022-01-01 ENCOUNTER — Other Ambulatory Visit: Payer: Self-pay | Admitting: Obstetrics and Gynecology

## 2022-01-01 DIAGNOSIS — Z1231 Encounter for screening mammogram for malignant neoplasm of breast: Secondary | ICD-10-CM

## 2022-02-08 ENCOUNTER — Other Ambulatory Visit: Payer: Self-pay

## 2022-02-08 ENCOUNTER — Ambulatory Visit
Admission: RE | Admit: 2022-02-08 | Discharge: 2022-02-08 | Disposition: A | Discharge status: Home / Self Care | Payer: Medicare Other | Source: Ambulatory Visit | Attending: Obstetrics and Gynecology | Admitting: Obstetrics and Gynecology

## 2022-02-08 DIAGNOSIS — Z1231 Encounter for screening mammogram for malignant neoplasm of breast: Secondary | ICD-10-CM

## 2022-02-08 IMAGING — MG MM DIGITAL SCREENING BILAT W/ TOMO AND CAD
8 series · 8 of 24 positions shown · non-contrast
Comparison: Previous exam(s).

CLINICAL DATA: Screening.

EXAM:
DIGITAL SCREENING BILATERAL MAMMOGRAM WITH TOMOSYNTHESIS AND CAD
TECHNIQUE: Bilateral screening digital craniocaudal and mediolateral oblique
mammograms were obtained. Bilateral screening digital breast
tomosynthesis was performed. The images were evaluated with
computer-aided detection.

[R MLO synth-2D]
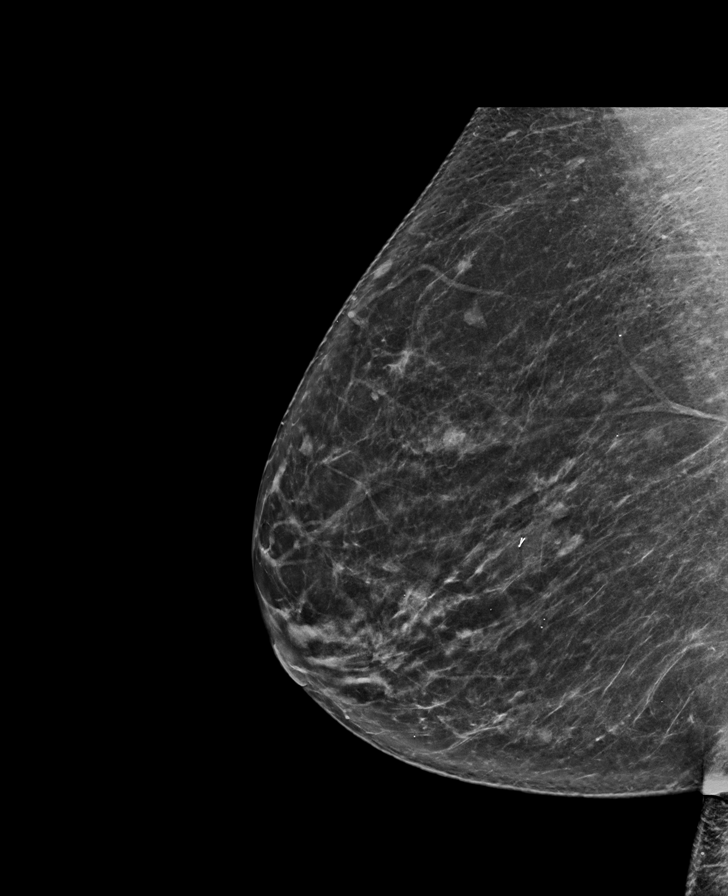

[L CC synth-2D]
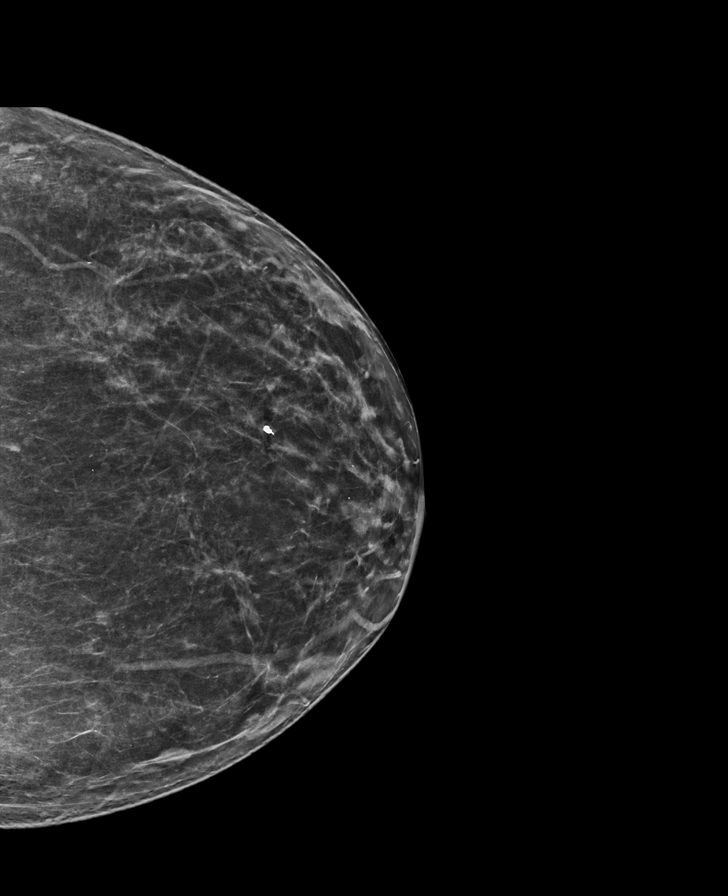

[L MLO synth-2D]
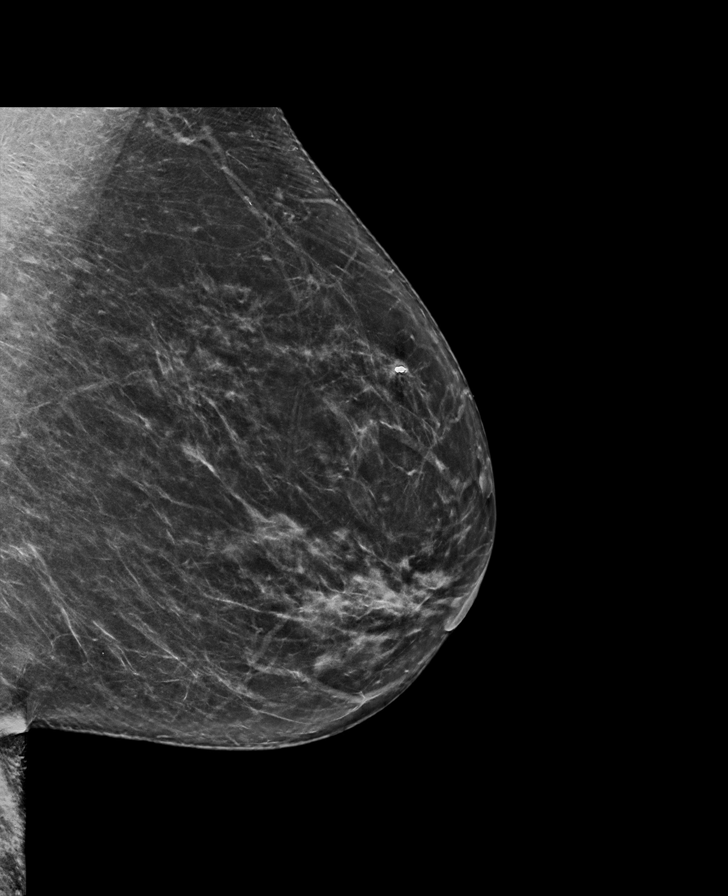

[R CC synth-2D]
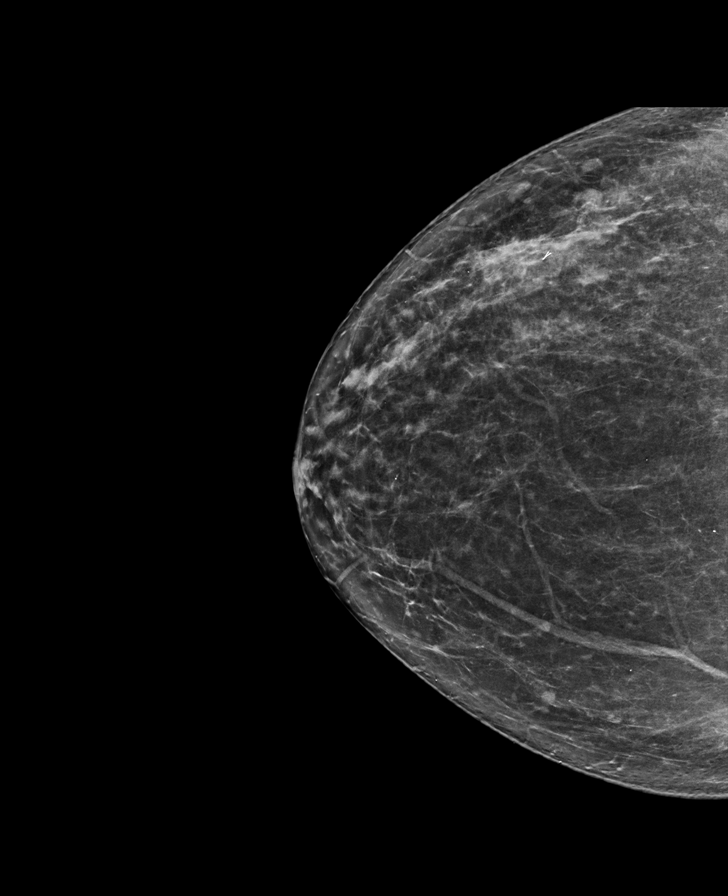

[L CC tomo · tomo slice 36/71.0]
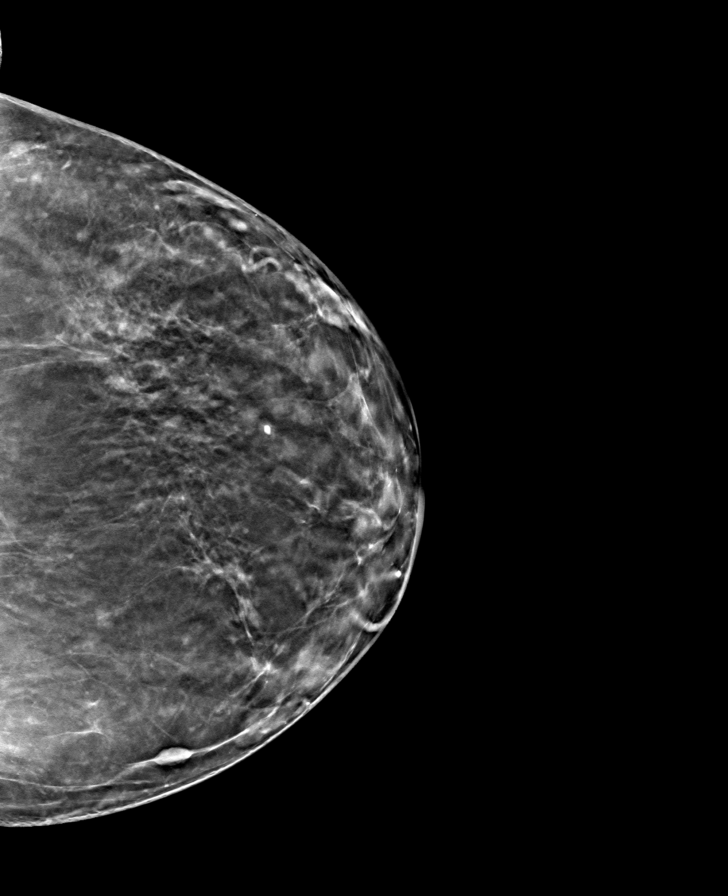

[R CC tomo · tomo slice 38/75.0]
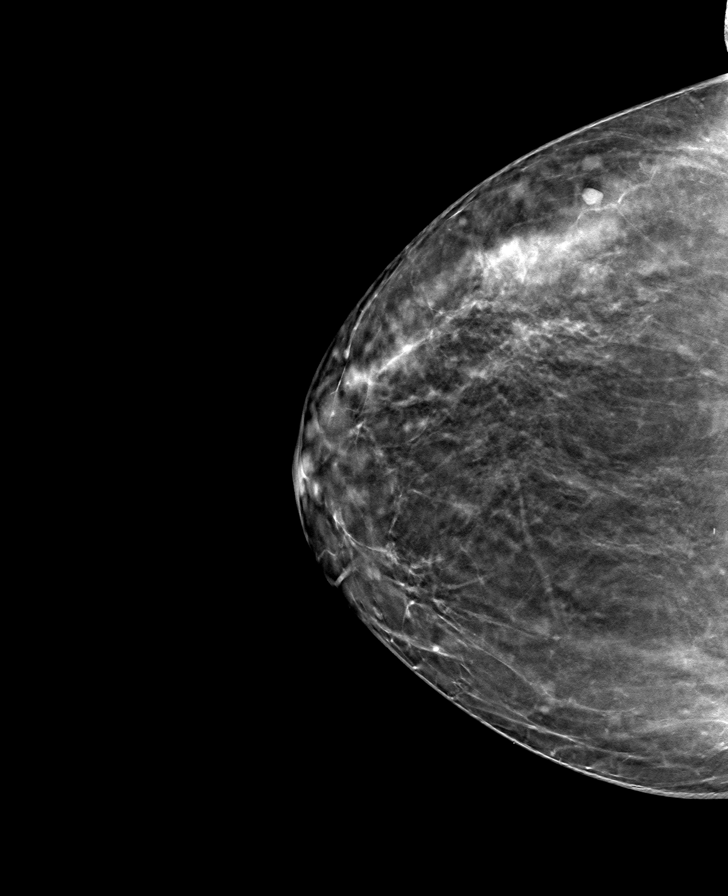

[L MLO tomo · tomo slice 39/77.0]
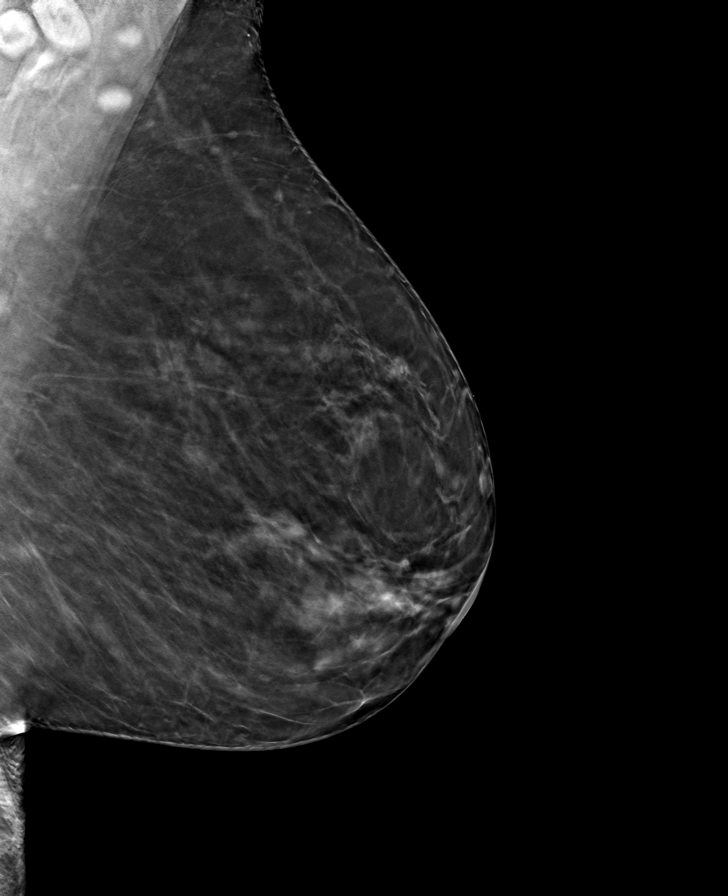

[R MLO tomo · tomo slice 39/77.0]
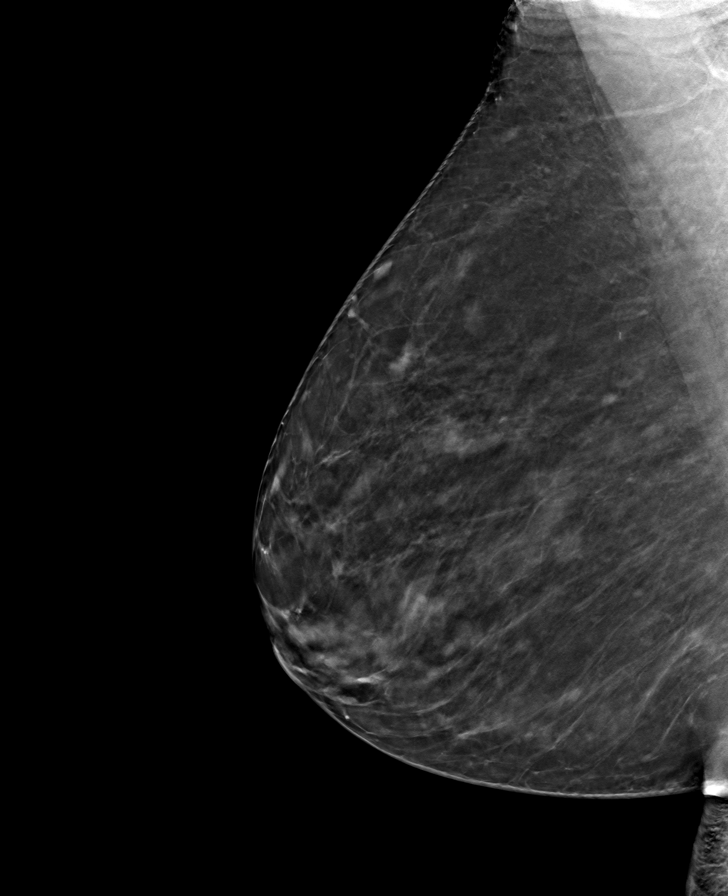

[8 of 24 positions shown; findings below may reference images not displayed]

ACR Breast Density Category b: There are scattered areas of
fibroglandular density.
FINDINGS: In the right breast possible asymmetry requires further evaluation.

In the left breast possible asymmetry requires further evaluation.
IMPRESSION: Further evaluation is suggested for possible asymmetry in the right
breast.

Further evaluation is suggested for possible asymmetry in the left
breast.

RECOMMENDATION:
Diagnostic mammogram and possibly ultrasound of both breasts.
(Code:[GV])

The patient will be contacted regarding the findings, and additional
imaging will be scheduled.

BI-RADS CATEGORY  0: Incomplete. Need additional imaging evaluation
and/or prior mammograms for comparison.

## 2022-02-08 NOTE — Progress Notes (Signed)
[name] [place] Phone: [phone] Subjective:    I'm seeing this patient by the request  of:  Billie Ruddy, MD  CC:   [id number]  11/28/2021 Patient does have signs and symptoms consistent with more of a right ankle arthritis.  On ultrasound does appear to be somewhat posttraumatic.  Concern for potential OCD.  We discussed the compression, icing, home exercises and proper shoes.  Worsening pain and I do think possible injections could be beneficial.  Patient does have CLL that could cause some difficulty as well as the osteopenia.  We will continue to monitor patient closely.  Follow-up again 6 weeks  Update 02/11/2022 Paula [name] is a 71 y.o. female coming in with complaint of R ankle pain. Patient states       Past Medical History:  Diagnosis Date   CARPAL TUNNEL SYNDROME, MILD 12/05/2008   Chronic cough - seeing pulmonologist in [place], [facility] 03/14/2014   HSV 03/01/2010   Leg skin lesion, left 01/30/2015   precancerous cells per patient   OSTEOPENIA 12/05/2008   PARESTHESIA 09/01/2009   Waldenstroms macroglobulinemia - followed by Dr. Carolynn Sayers, [place], [place] 03/15/2014   Past Surgical History:  Procedure Laterality Date   CHOLECYSTECTOMY  02/2017   LEG SKIN LESION  BIOPSY / EXCISION Left 01/30/2015   Dr Santiago Glad Gould-precancerous cells per patient   ORIF [place]   Social History   Socioeconomic History   Marital status: Married    Spouse name: Not on file   Number of children: Not on file   Years of education: Not on file   Highest education level: Not on file  Occupational History   Not on file  Tobacco Use   Smoking status: Never   Smokeless tobacco: Never  Substance and Sexual Activity   Alcohol use: Yes    Alcohol/week: 6.0 standard drinks    Types: 6 Glasses of wine per week   Drug use:  No   Sexual activity: Not on file  Other Topics Concern   Not on file  Social History Narrative   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:      Spiritual Beliefs:      Lifestyle:            Social Determinants of Radio broadcast assistant Strain: Not on file  Food Insecurity: Not on file  Transportation Needs: Not on file  Physical Activity: Not on file  Stress: Not on file  Social Connections: Not on file   Allergies  Allergen Reactions   Ciprofloxacin Nausea Only   Family History  Problem Relation Age of Onset   Hypertension Mother    Hypertension Brother    Colon cancer Father 30   Hypertension Son    Cancer Neg Hx        lung ca - mother's side   Arthritis Neg Hx        osteo - mother's side   Breast cancer Neg Hx      Current Outpatient Medications (Cardiovascular):    lisinopril (ZESTRIL) 10 MG tablet, Take 1 tablet (10 mg total) by mouth daily. (Patient not taking: Reported on 04/13/2020)     Current Outpatient Medications (Other):    acyclovir ointment (ZOVIRAX) 5 %, Apply 1 application topically every 3 (three) hours.   Reviewed prior external  information including notes and imaging from  primary care provider As well as notes that were available from care everywhere and other healthcare systems.  Past medical history, social, surgical and family history all reviewed in electronic medical record.  No pertanent information unless stated regarding to the chief complaint.   Review of Systems:  No headache, visual changes, nausea, vomiting, diarrhea, constipation, dizziness, abdominal pain, skin rash, fevers, chills, night sweats, weight loss, swollen lymph nodes, body aches, joint swelling, chest pain, shortness of breath, mood changes. POSITIVE muscle aches  Objective  There were no vitals taken for this visit.   General: No apparent distress alert and oriented x3 mood and affect normal, dressed appropriately.  HEENT: Pupils equal,  extraocular movements intact  Respiratory: Patient's speak in full sentences and does not appear short of breath  Cardiovascular: No lower extremity edema, non tender, no erythema  Gait normal with good balance and coordination.  MSK:  Non tender with full range of motion and good stability and symmetric strength and tone of shoulders, elbows, wrist, hip, knee and ankles bilaterally.     Impression and Recommendations:     The above documentation has been reviewed and is accurate and complete Jacqualin Combes

## 2022-02-11 ENCOUNTER — Encounter: Payer: Self-pay | Admitting: Family Medicine

## 2022-02-11 ENCOUNTER — Ambulatory Visit (INDEPENDENT_AMBULATORY_CARE_PROVIDER_SITE_OTHER): Payer: Medicare Other | Admitting: Family Medicine

## 2022-02-11 ENCOUNTER — Ambulatory Visit: Payer: Self-pay

## 2022-02-11 ENCOUNTER — Other Ambulatory Visit: Payer: Self-pay

## 2022-02-11 VITALS — BP 136/80 | HR 78 | Ht 65.0 in | Wt 160.0 lb

## 2022-02-11 DIAGNOSIS — M19071 Primary osteoarthritis, right ankle and foot: Secondary | ICD-10-CM | POA: Diagnosis not present

## 2022-02-11 NOTE — Patient Instructions (Addendum)
PRP handout MRI R ankle [phone] We will be in touch with results Depending on MRI will talk PRP vs orthotics

## 2022-02-11 NOTE — Assessment & Plan Note (Signed)
Patient does have the arthritic changes noted.  Unfortunately continues to have difficulty.  The patient does have hypoechoic changes noted of the posterior tibialis with intersubstance tearing that I think is also contributing.  Patient does have a history of CLL and I do feel that advanced imaging with an MRI is warranted.  Patient does have some postsurgical changes from greater than 40 years ago that I will make the anatomy is somewhat different and difficult to assess appropriately.  Depending on findings patient could be a candidate for PRP but this could be secondary to the arthritis or the tendon of the posterior tibialis.  This would change if we do leukocyte poor versus leukocyte rich mixture.  Depending on findings of the MRI we will discuss next step treatment.

## 2022-02-11 NOTE — Progress Notes (Signed)
[place] [facility] Phone: [phone] Subjective:   Fontaine No, am serving as a scribe for Dr. Hulan Saas.  This visit occurred during the SARS-CoV-2 public health emergency.  Safety protocols were in place, including screening questions prior to the visit, additional usage of staff PPE, and extensive cleaning of exam room while observing appropriate contact time as indicated for disinfecting solutions.    I'm seeing this patient by the request  of:  Billie Ruddy, MD  CC: Right ankle pain follow-up  [id number]  11/28/2021 Patient does have signs and symptoms consistent with more of a right ankle arthritis.  On ultrasound does appear to be somewhat posttraumatic.  Concern for potential OCD.  We discussed the compression, icing, home exercises and proper shoes.  Worsening pain and I do think possible injections could be beneficial.  Patient does have CLL that could cause some difficulty as well as the osteopenia.  We will continue to monitor patient closely.  Follow-up again 6 weeks  Update 02/11/2022 Paula [name] is a 71 y.o. female coming in with complaint of R ankle pain. Patient states that she has been doing well. Soreness finally went away 2 weeks ago. Would like xray results.  Patient did have x-rays at last exam showing significant arthritic changes noted.  Patient's previous surgical intervention for the tib-fib without has some callus formation but nothing new that would be corresponding.  Masses appreciated.  Patient states that she is not making any improvement.  Wants to know what else can be done.     Past Medical History:  Diagnosis Date   CARPAL TUNNEL SYNDROME, MILD 12/05/2008   Chronic cough - seeing pulmonologist in [place], [facility] 03/14/2014   HSV 03/01/2010   Leg skin lesion, left 01/30/2015   precancerous cells per patient   OSTEOPENIA 12/05/2008   PARESTHESIA 09/01/2009    Waldenstroms macroglobulinemia - followed by Dr. Carolynn Sayers, [place], [place] 03/15/2014   Past Surgical History:  Procedure Laterality Date   CHOLECYSTECTOMY  02/2017   LEG SKIN LESION  BIOPSY / EXCISION Left 01/30/2015   Dr Santiago Glad Gould-precancerous cells per patient   ORIF [place]   Social History   Socioeconomic History   Marital status: Married    Spouse name: Not on file   Number of children: Not on file   Years of education: Not on file   Highest education level: Not on file  Occupational History   Not on file  Tobacco Use   Smoking status: Never   Smokeless tobacco: Never  Substance and Sexual Activity   Alcohol use: Yes    Alcohol/week: 6.0 standard drinks    Types: 6 Glasses of wine per week   Drug use: No   Sexual activity: Not on file  Other Topics Concern   Not on file  Social History Narrative   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:      Spiritual Beliefs:      Lifestyle:            Social Determinants of Health   Financial Resource Strain: Not on file  Food Insecurity: Not on file  Transportation Needs: Not on file  Physical Activity: Not on file  Stress: Not on file  Social Connections: Not on file   Allergies  Allergen Reactions   Ciprofloxacin Nausea Only  Family History  Problem Relation Age of Onset   Hypertension Mother    Hypertension Brother    Colon cancer Father 90   Hypertension Son    Cancer Neg Hx        lung ca - mother's side   Arthritis Neg Hx        osteo - mother's side   Breast cancer Neg Hx      Current Outpatient Medications (Cardiovascular):    lisinopril (ZESTRIL) 10 MG tablet, Take 1 tablet (10 mg total) by mouth daily.     Current Outpatient Medications (Other):    acyclovir ointment (ZOVIRAX) 5 %, Apply 1 application topically every 3 (three) hours.   Reviewed prior external information including notes and imaging  from  primary care provider As well as notes that were available from care everywhere and other healthcare systems.  Past medical history, social, surgical and family history all reviewed in electronic medical record.  No pertanent information unless stated regarding to the chief complaint.   Review of Systems:  No headache, visual changes, nausea, vomiting, diarrhea, constipation, dizziness, abdominal pain, skin rash, fevers, chills, night sweats, weight loss, swollen lymph nodes, body aches, joint swelling, chest pain, shortness of breath, mood changes. POSITIVE muscle aches  Objective  Blood pressure 136/80, pulse 78, height 5\' 5"  (1.651 m), weight 160 lb (72.6 kg), SpO2 98 %.   General: No apparent distress alert and oriented x3 mood and affect normal, dressed appropriately.  HEENT: Pupils equal, extraocular movements intact  Respiratory: Patient's speak in full sentences and does not appear short of breath  Cardiovascular: Trace lower extremity edema Gait antalgic Right ankle and does have asymmetry noted.  Does have hypertrophy of the bony abnormality of the medial malleolus.  Patient is tender to palpation over the medial aspect.  Significant overpronation of the hindfoot with standing.  Patient also has abnormality noted of the medial aspect of the tibia from postsurgical changes. Tender to palpation over the medial aspect of the ankle with decreased range of motion with flexion and extension as well.  Limited muscular skeletal ultrasound was performed and interpreted by Hulan Saas, M  Limited ultrasound shows significant nearly bone-on-bone osteoarthritic changes of the medial compartment of the ankle.  Patient also has hypoechoic changes with an increase in neovascularization consistent with intrasubstance tearing of the posterior tibialis tendon Impression: Right ankle arthritis with posterior tibialis tendon intersubstance tearing noted.   Impression and Recommendations:      The above documentation has been reviewed and is accurate and complete Lyndal Pulley, DO

## 2022-02-12 ENCOUNTER — Other Ambulatory Visit: Payer: Self-pay | Admitting: Obstetrics and Gynecology

## 2022-02-12 DIAGNOSIS — R928 Other abnormal and inconclusive findings on diagnostic imaging of breast: Secondary | ICD-10-CM

## 2022-02-23 ENCOUNTER — Other Ambulatory Visit: Payer: Self-pay

## 2022-02-23 ENCOUNTER — Ambulatory Visit
Admission: RE | Admit: 2022-02-23 | Discharge: 2022-02-23 | Disposition: A | Discharge status: Home / Self Care | Payer: Medicare Other | Source: Ambulatory Visit | Attending: Family Medicine | Admitting: Family Medicine

## 2022-02-23 DIAGNOSIS — M19071 Primary osteoarthritis, right ankle and foot: Secondary | ICD-10-CM

## 2022-02-23 IMAGING — MR MR [PERSON_NAME] LOW W/O CM*R*
4 of 6 series · 17 of 40 positions shown · non-contrast
Comparison: Right ankle x-ray [DATE]

CLINICAL DATA: Right ankle soreness.  Ongoing for 2 years.

EXAM:
MRI OF THE RIGHT ANKLE WITHOUT CONTRAST
MRI OF LOWER RIGHT EXTREMITY WITHOUT CONTRAST
TECHNIQUE: Multiplanar, multisequence MR imaging of the right lower leg was
performed. No intravenous contrast was administered.
Multiplanar, multisequence MR imaging of the ankle was performed. No
intravenous contrast was administered.

[Series 4: T1 · coronal · 4.0mm · 0.70mm/px · 3 of 32 slices shown (1 of 2)]
[im 1/32]
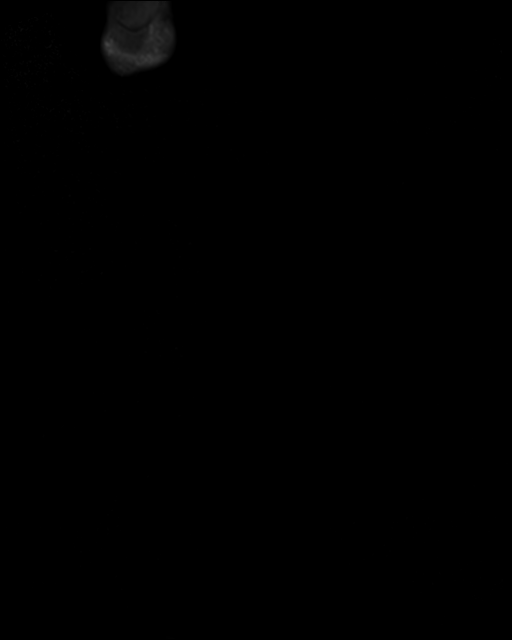
[im 21/32]
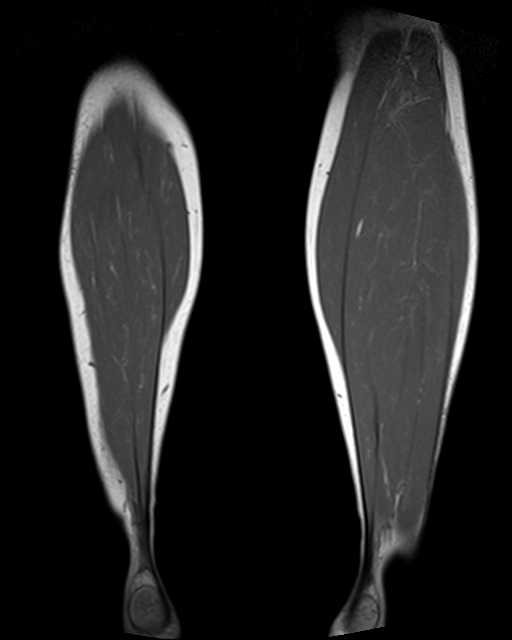
[im 32/32]
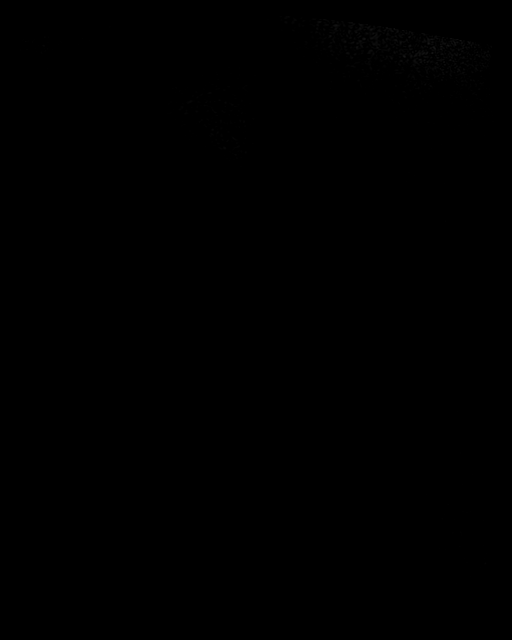

[Series 6: T2 fat-sat · axial · 5.0mm · 0.35mm/px · z∈[-269,+72]mm · 8 of 64 slices shown]
[im 1/64]
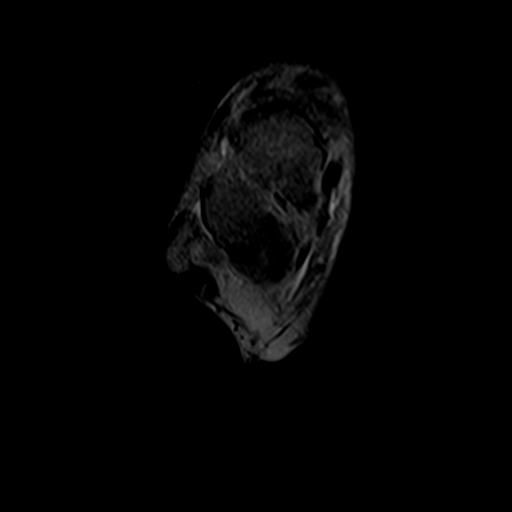
[im 8/64]
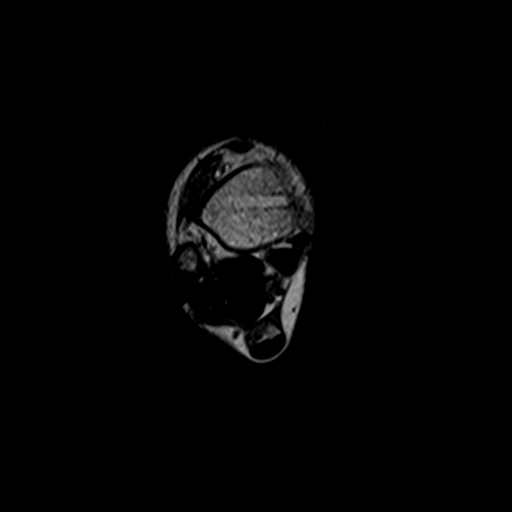
[im 16/64]
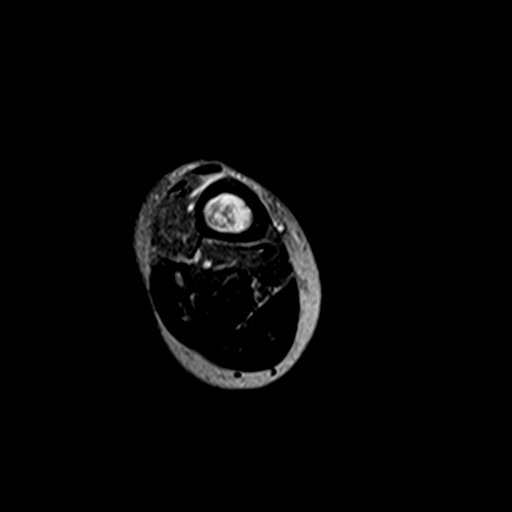
[im 24/64]
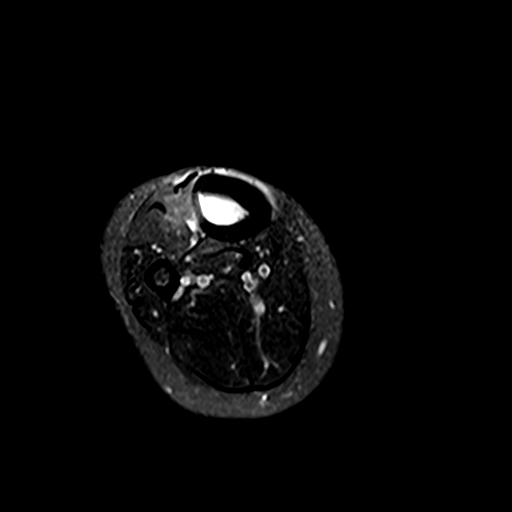
[im 32/64]
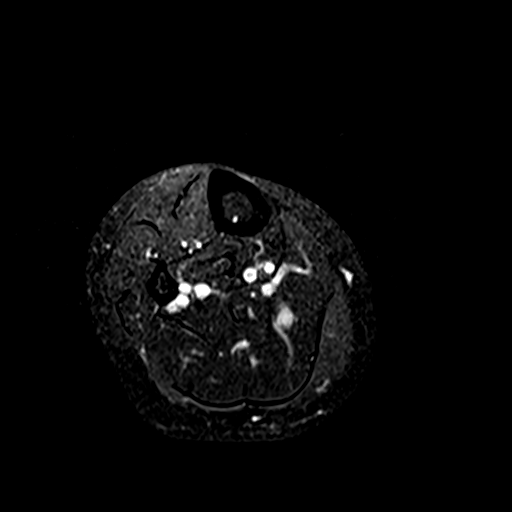
[im 40/64]
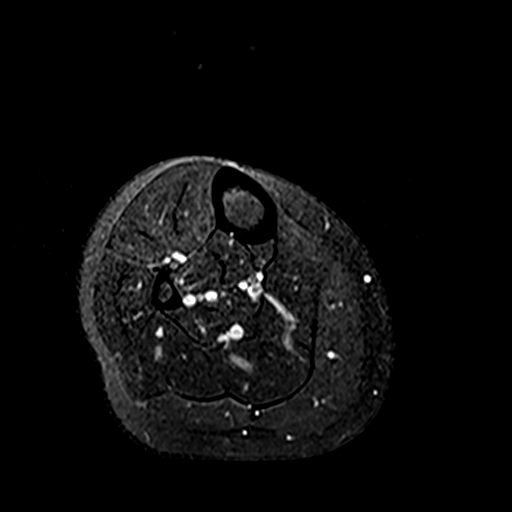
[im 48/64]
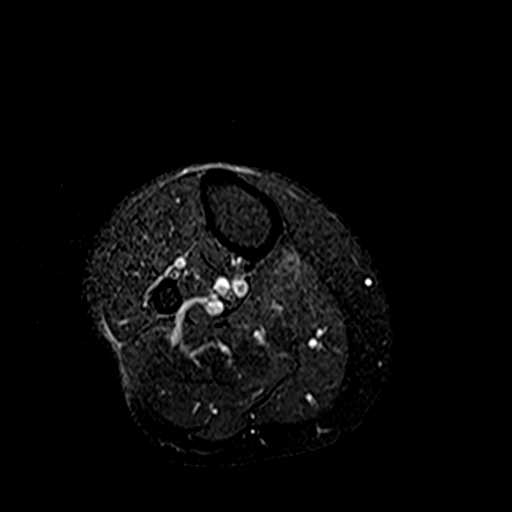
[im 56/64]
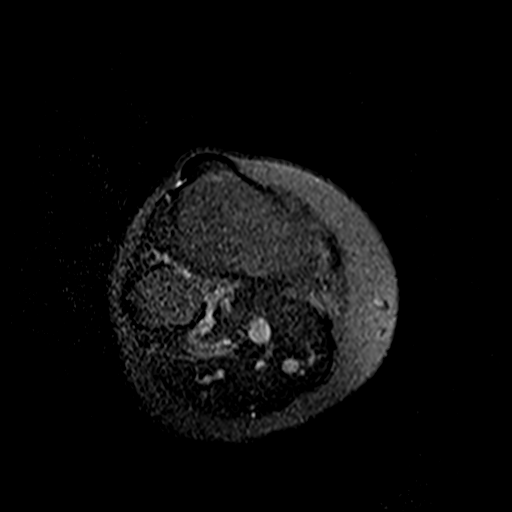

[Series 7: T1 · axial · 5.0mm · 0.35mm/px · z∈[-226,+72]mm · 3 of 64 slices shown (2 of 2)]
[im 8/64]
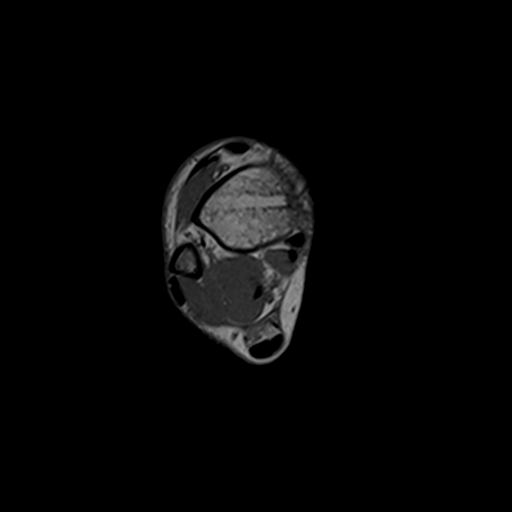
[im 32/64]
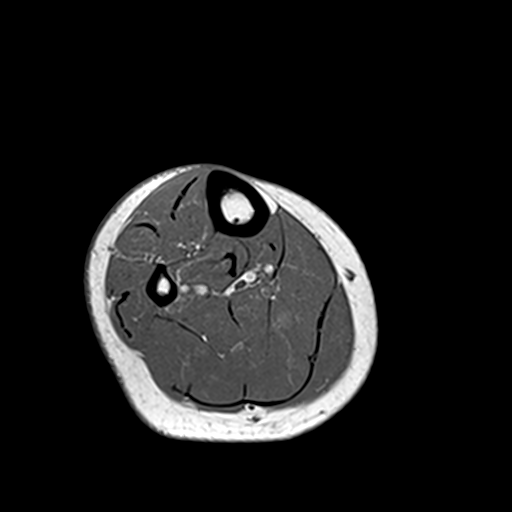
[im 56/64]
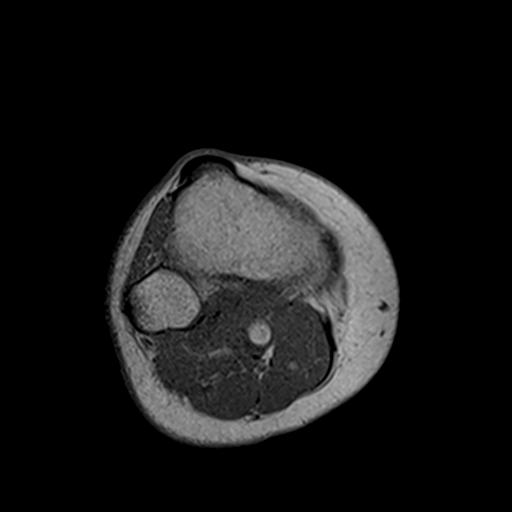

[Series 8: T1 fat-sat · axial · 5.0mm · 0.35mm/px · z∈[-226,+72]mm · 3 of 64 slices shown]
[im 8/64]
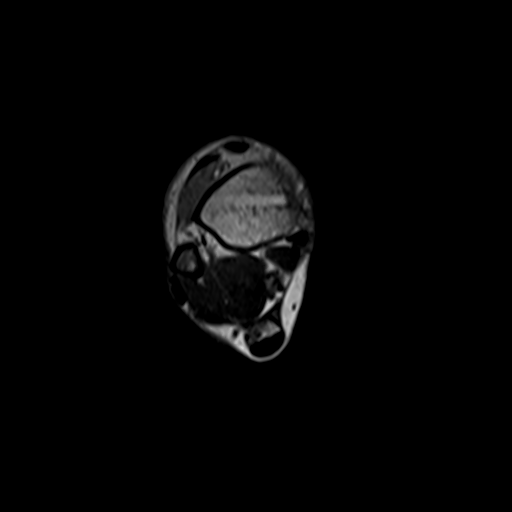
[im 32/64]
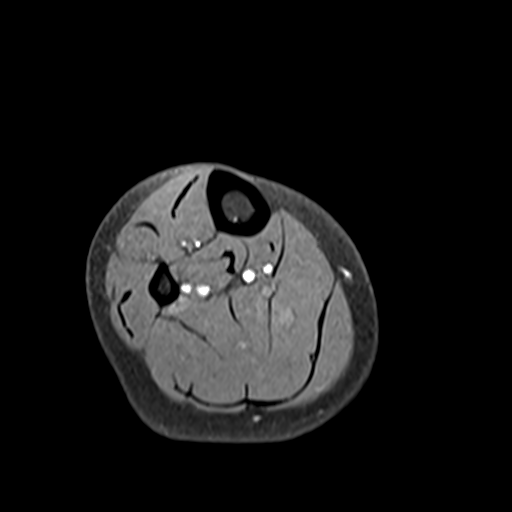
[im 56/64]
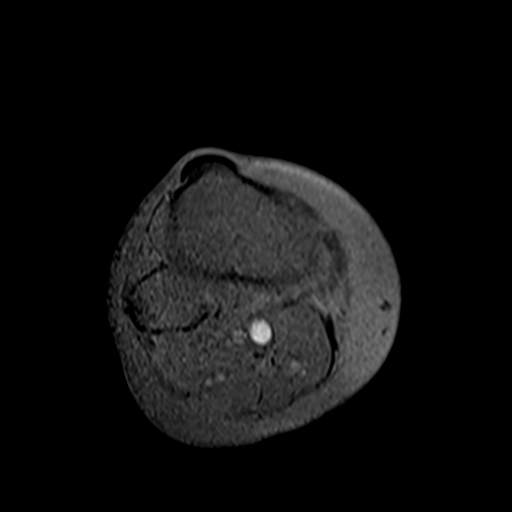

[17 of 40 positions shown; findings below may reference images not displayed]

FINDINGS: TENDONS

Peroneal: Peroneal longus tendon intact. Mild tendinosis of the
peroneus brevis with a short-segment longitudinal split tear.

Posteromedial: Posterior tibial tendon intact. Flexor hallucis
longus tendon intact. Flexor digitorum longus tendon intact.

Anterior: Tibialis anterior tendon intact. Extensor hallucis longus
tendon intact Extensor digitorum longus tendon intact.

Achilles:  Intact.

Plantar Fascia: Intact.

LIGAMENTS

Lateral: Anterior talofibular ligament intact. Calcaneofibular
ligament intact. Posterior talofibular ligament intact. Anterior and
posterior tibiofibular ligaments intact.

Medial: Deltoid ligament intact. Spring ligament intact.

CARTILAGE

Ankle Joint: No joint effusion. Partial-thickness cartilage loss of
the tibiotalar joint.

Subtalar Joints/Sinus Tarsi: Normal subtalar joints. No subtalar
joint effusion. Normal sinus tarsi.

Bones: Severe osteoarthritis of the talonavicular joint with
subchondral reactive marrow edema and cystic changes. Mild
osteoarthritis of the calcaneocuboid joint. Old healed distal tibial
diaphysis fracture transfixed with 2 screws. Old healed distal
fibular diaphysis fracture.

Abnormal marrow edema in the distal fibular diametaphysis. No
surrounding periosteal reaction or bone destruction. No surrounding
soft tissue edema.

Soft Tissue: No fluid collection or hematoma. Muscles are normal
without edema or atrophy. Tarsal tunnel is normal.
IMPRESSION: 1. Mild tendinosis of the peroneus brevis with a short-segment
longitudinal split tear.
2. Severe osteoarthritis of the talonavicular joint with subchondral
reactive marrow edema and cystic changes.
3. Mild osteoarthritis of the calcaneocuboid joint.
4. Old healed distal tibial diaphysis fracture transfixed with 2
screws. Old healed distal fibular diaphysis fracture. Abnormal
marrow edema in the distal fibular diametaphysis. No surrounding
periosteal reaction or bone destruction. Overall appearance is
concerning for stress reaction versus less likely infection or
malignancy. If the patient's symptoms fail to resolve, recommend an
MRI of the right lower leg in 3-6 months.

## 2022-02-23 IMAGING — MR MR ANKLE*R* W/O CM
6 series · 39 of 40 positions shown · non-contrast
Comparison: Right ankle x-ray [DATE]

CLINICAL DATA: Right ankle soreness.  Ongoing for 2 years.

EXAM:
MRI OF THE RIGHT ANKLE WITHOUT CONTRAST
MRI OF LOWER RIGHT EXTREMITY WITHOUT CONTRAST
TECHNIQUE: Multiplanar, multisequence MR imaging of the right lower leg was
performed. No intravenous contrast was administered.
Multiplanar, multisequence MR imaging of the ankle was performed. No
intravenous contrast was administered.

[Series 4: T2 fat-sat · axial · 3.0mm · 0.50mm/px · z∈[-84,+48]mm · 9 of 35 slices shown (1 of 2)]
[im 1/35]
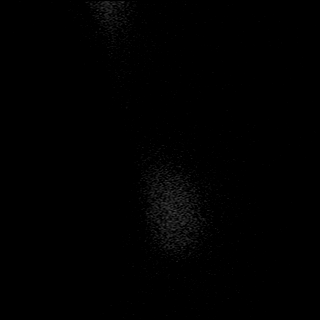
[im 5/35]
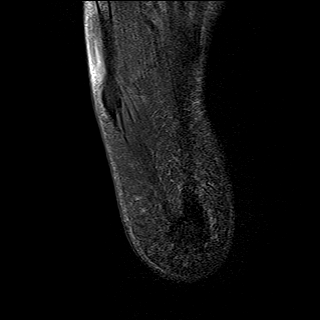
[im 9/35]
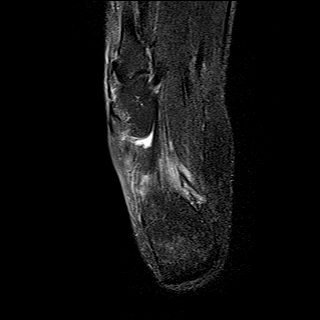
[im 13/35]
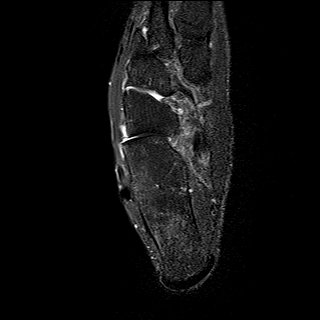
[im 18/35]
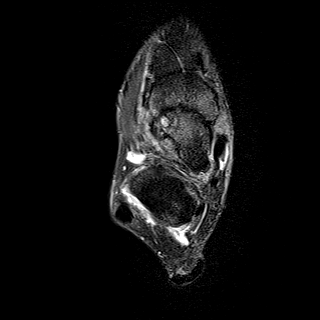
[im 22/35]
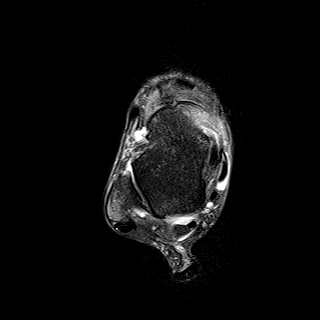
[im 26/35]
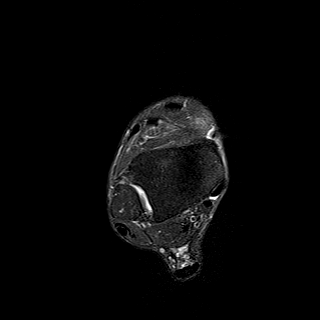
[im 30/35]
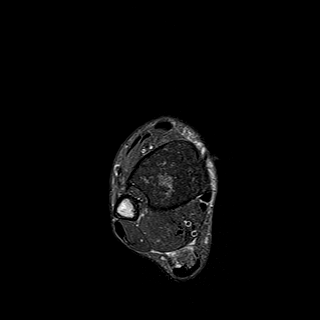
[im 35/35]
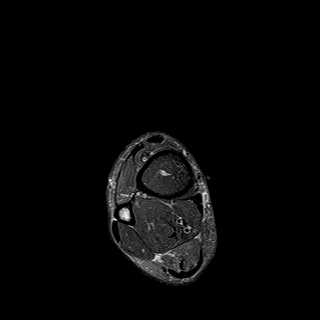

[Series 5: PD fat-sat · axial · 3.0mm · 0.50mm/px · z∈[-84,+48]mm · 9 of 35 slices shown]
[im 1/35]
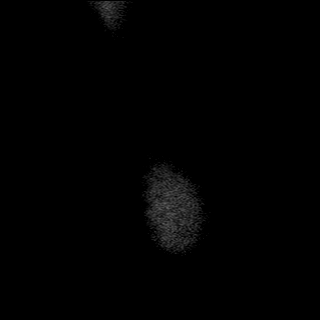
[im 5/35]
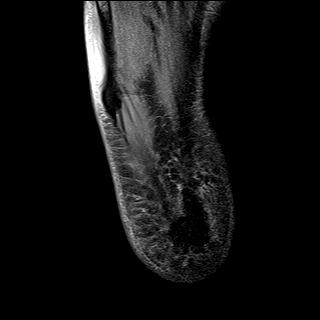
[im 9/35]
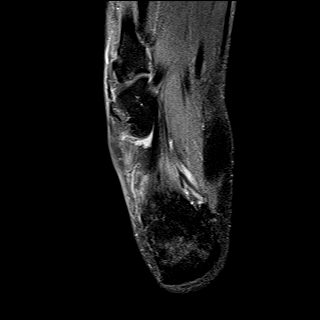
[im 13/35]
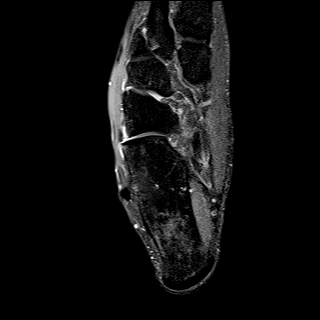
[im 18/35]
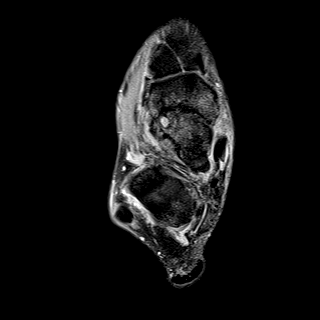
[im 22/35]
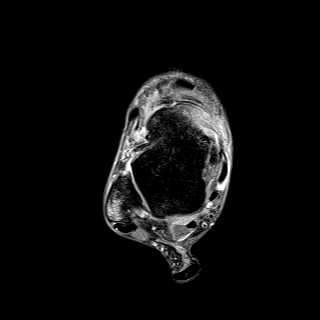
[im 26/35]
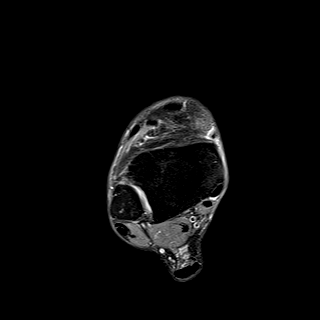
[im 30/35]
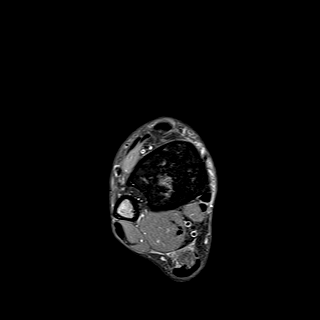
[im 35/35]
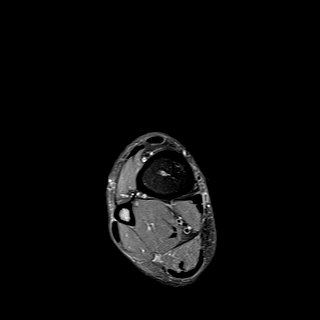

[Series 6: T1 · sagittal · 4.0mm · 0.56mm/px · 5 of 22 slices shown]
[im 1/22]
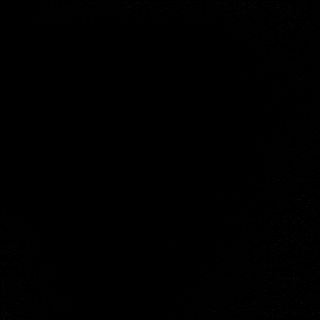
[im 6/22]
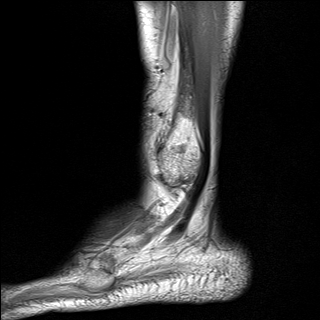
[im 11/22]
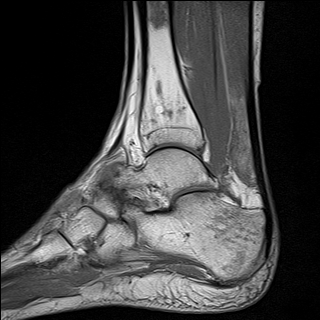
[im 16/22]
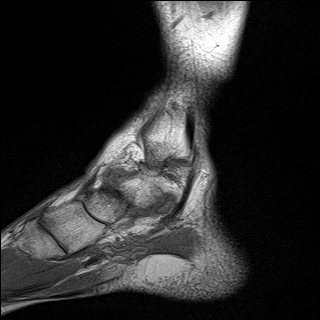
[im 22/22]
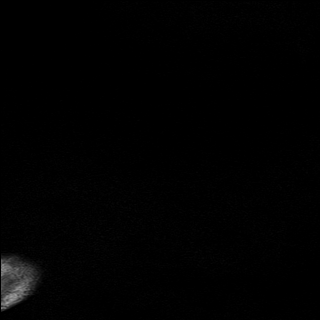

[Series 7: STIR · sagittal · 4.0mm · 0.35mm/px · 4 of 22 slices shown]
[im 1/22]
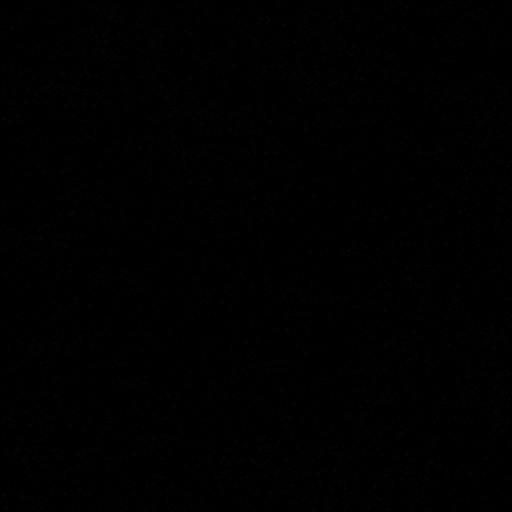
[im 6/22]
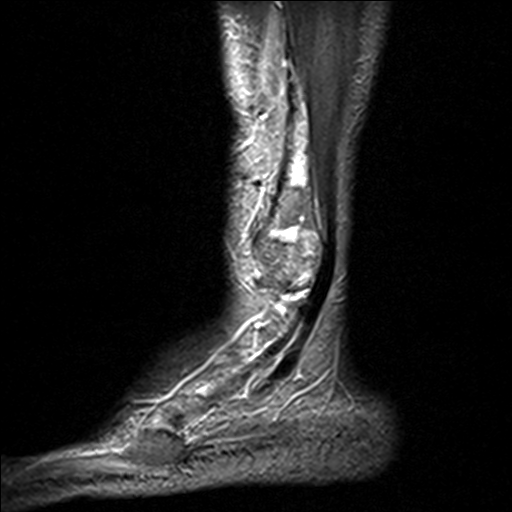
[im 11/22]
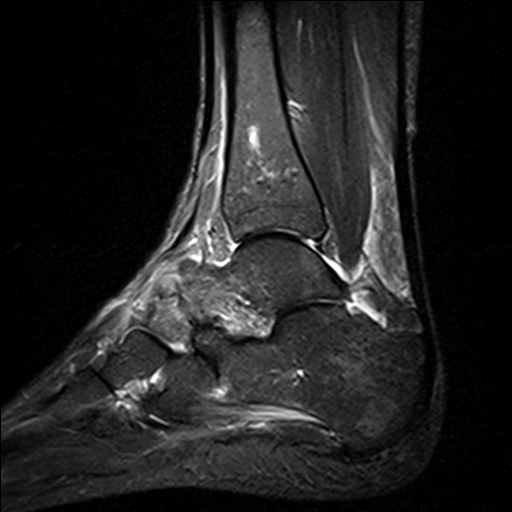
[im 16/22]
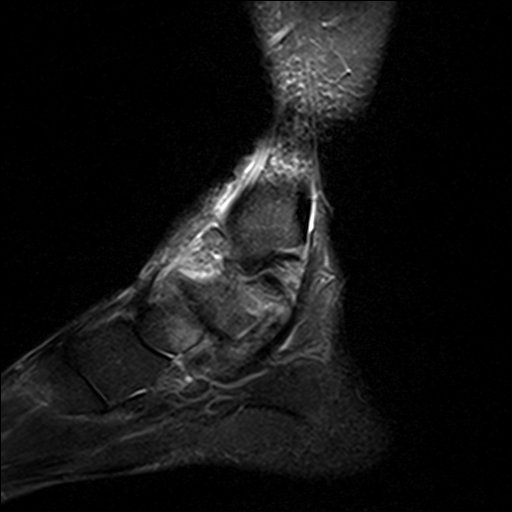

[Series 8: T1 fat-sat · axial · 3.0mm · 0.62mm/px · z∈[-84,+36]mm · 5 of 20 slices shown]
[im 1/20]
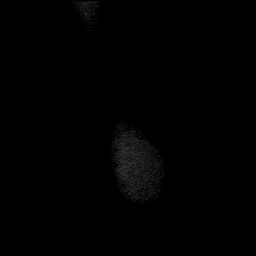
[im 5/20]
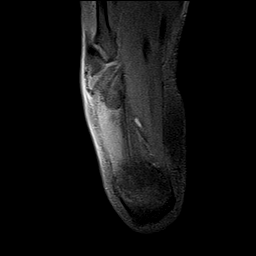
[im 10/20]
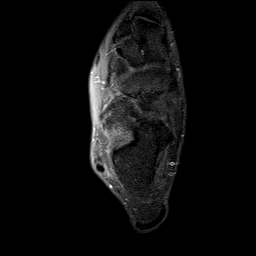
[im 15/20]
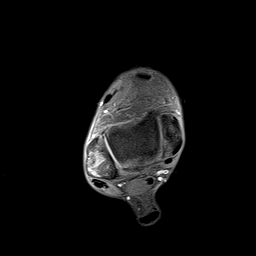
[im 20/20]
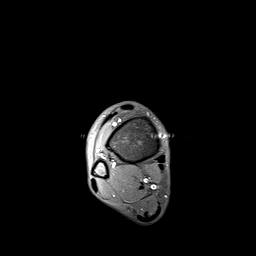

[Series 9: T2 fat-sat · coronal · 3.0mm · 0.56mm/px · 7 of 30 slices shown (2 of 2)]
[im 1/30]
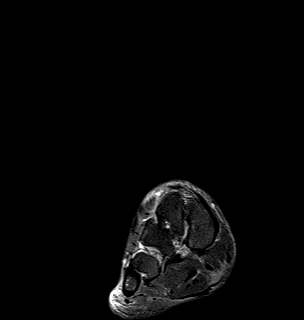
[im 5/30]
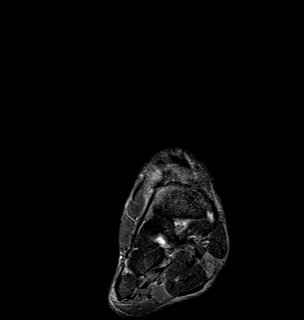
[im 10/30]
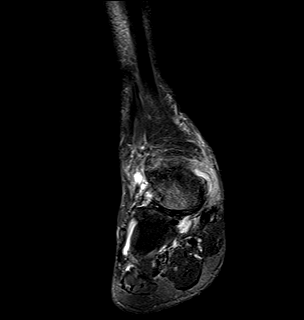
[im 15/30]
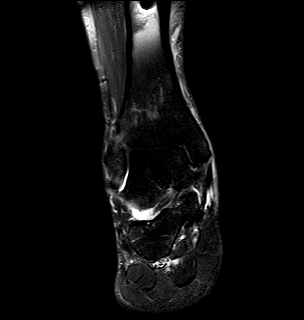
[im 20/30]
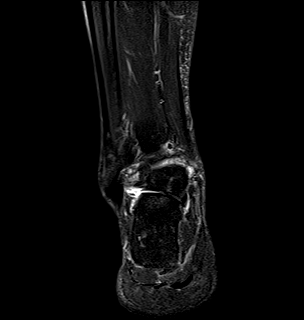
[im 25/30]
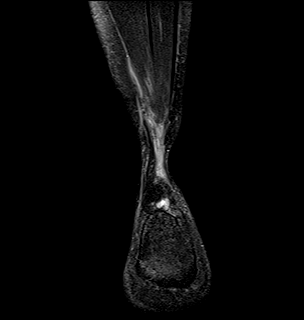
[im 30/30]
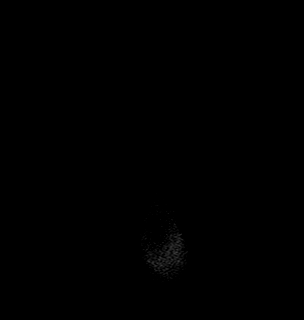

[39 of 40 positions shown; findings below may reference images not displayed]

FINDINGS: TENDONS

Peroneal: Peroneal longus tendon intact. Mild tendinosis of the
peroneus brevis with a short-segment longitudinal split tear.

Posteromedial: Posterior tibial tendon intact. Flexor hallucis
longus tendon intact. Flexor digitorum longus tendon intact.

Anterior: Tibialis anterior tendon intact. Extensor hallucis longus
tendon intact Extensor digitorum longus tendon intact.

Achilles:  Intact.

Plantar Fascia: Intact.

LIGAMENTS

Lateral: Anterior talofibular ligament intact. Calcaneofibular
ligament intact. Posterior talofibular ligament intact. Anterior and
posterior tibiofibular ligaments intact.

Medial: Deltoid ligament intact. Spring ligament intact.

CARTILAGE

Ankle Joint: No joint effusion. Partial-thickness cartilage loss of
the tibiotalar joint.

Subtalar Joints/Sinus Tarsi: Normal subtalar joints. No subtalar
joint effusion. Normal sinus tarsi.

Bones: Severe osteoarthritis of the talonavicular joint with
subchondral reactive marrow edema and cystic changes. Mild
osteoarthritis of the calcaneocuboid joint. Old healed distal tibial
diaphysis fracture transfixed with 2 screws. Old healed distal
fibular diaphysis fracture.

Abnormal marrow edema in the distal fibular diametaphysis. No
surrounding periosteal reaction or bone destruction. No surrounding
soft tissue edema.

Soft Tissue: No fluid collection or hematoma. Muscles are normal
without edema or atrophy. Tarsal tunnel is normal.
IMPRESSION: 1. Mild tendinosis of the peroneus brevis with a short-segment
longitudinal split tear.
2. Severe osteoarthritis of the talonavicular joint with subchondral
reactive marrow edema and cystic changes.
3. Mild osteoarthritis of the calcaneocuboid joint.
4. Old healed distal tibial diaphysis fracture transfixed with 2
screws. Old healed distal fibular diaphysis fracture. Abnormal
marrow edema in the distal fibular diametaphysis. No surrounding
periosteal reaction or bone destruction. Overall appearance is
concerning for stress reaction versus less likely infection or
malignancy. If the patient's symptoms fail to resolve, recommend an
MRI of the right lower leg in 3-6 months.

## 2022-02-27 ENCOUNTER — Other Ambulatory Visit: Payer: Self-pay | Admitting: Obstetrics and Gynecology

## 2022-02-27 ENCOUNTER — Ambulatory Visit
Admission: RE | Admit: 2022-02-27 | Discharge: 2022-02-27 | Disposition: A | Discharge status: Home / Self Care | Payer: Medicare Other | Source: Ambulatory Visit | Attending: Obstetrics and Gynecology | Admitting: Obstetrics and Gynecology

## 2022-02-27 DIAGNOSIS — N6310 Unspecified lump in the right breast, unspecified quadrant: Secondary | ICD-10-CM

## 2022-02-27 DIAGNOSIS — R928 Other abnormal and inconclusive findings on diagnostic imaging of breast: Secondary | ICD-10-CM

## 2022-02-27 IMAGING — MG DIGITAL DIAGNOSTIC BILAT W/ TOMO W/ CAD
8 series · 8 of 24 positions shown · non-contrast
Comparison: None.

CLINICAL DATA: The patient was called back from screening
mammography due to bilateral asymmetries. Of note, the patient has a
history of low-grade non-Hodgkin's lymphoma which is not currently
being treated.



[L MLO synth-2D]
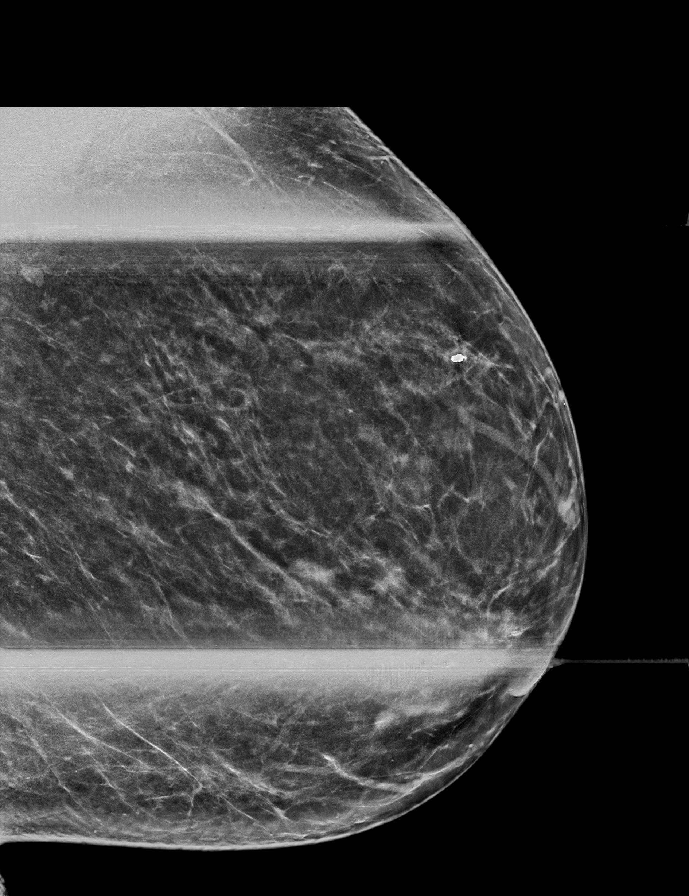

[L ML synth-2D]
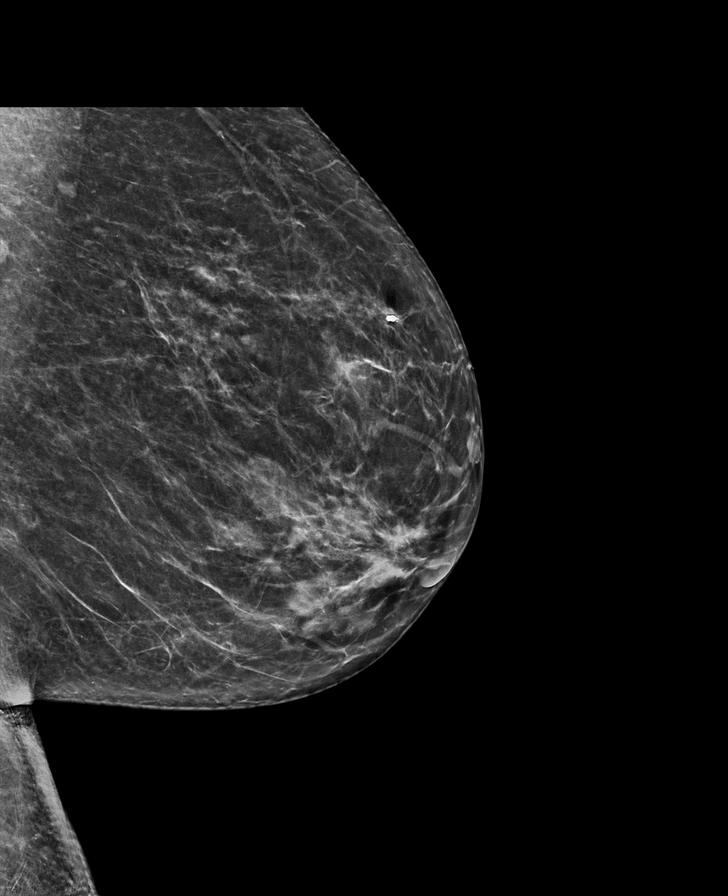

[R ML synth-2D]
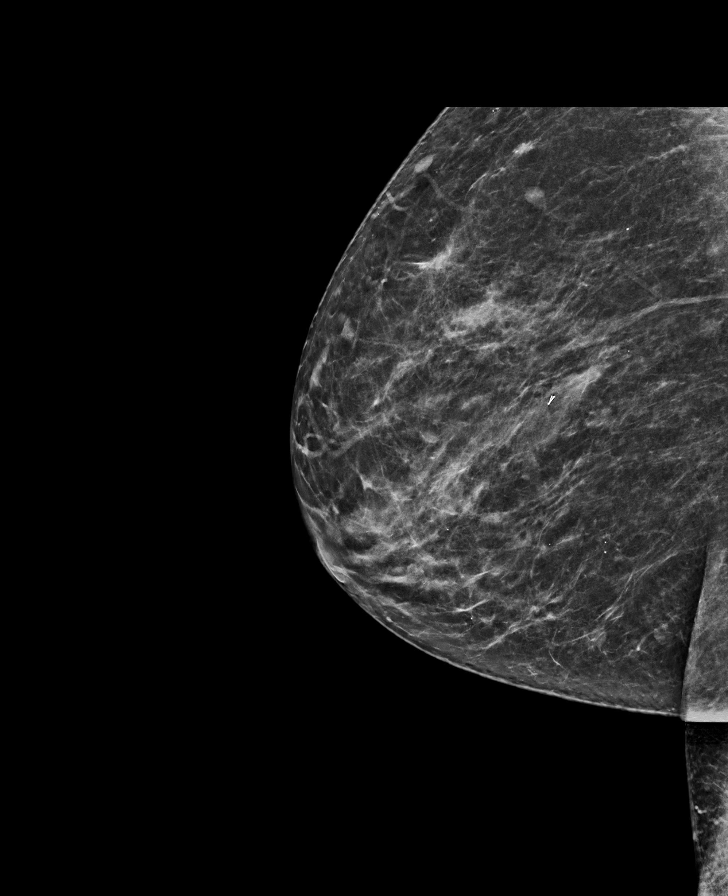

[R MLO synth-2D]
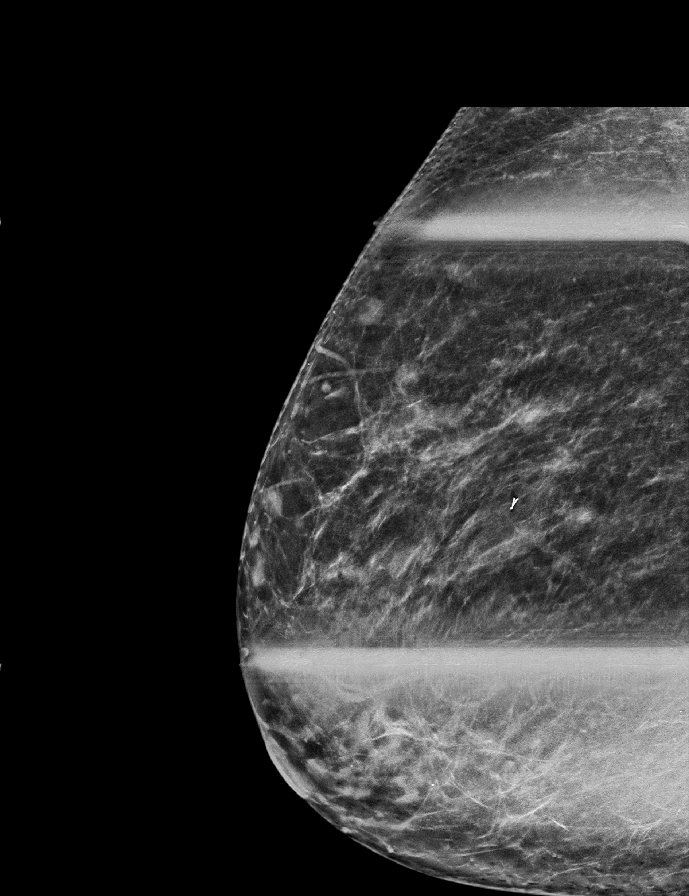

[R ML tomo · tomo slice 37/72.0]
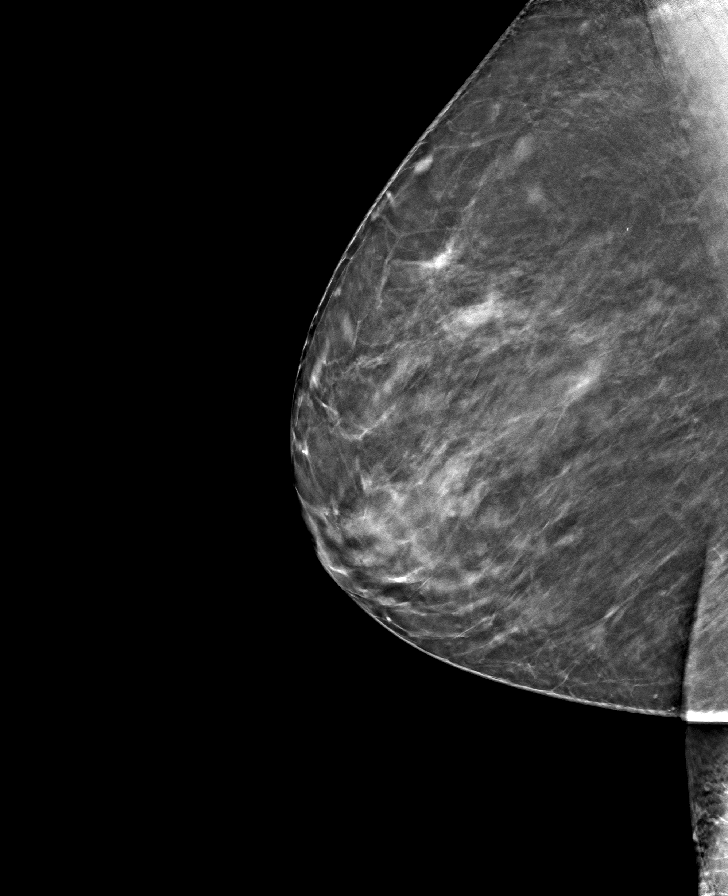

[L ML tomo · tomo slice 37/73.0]
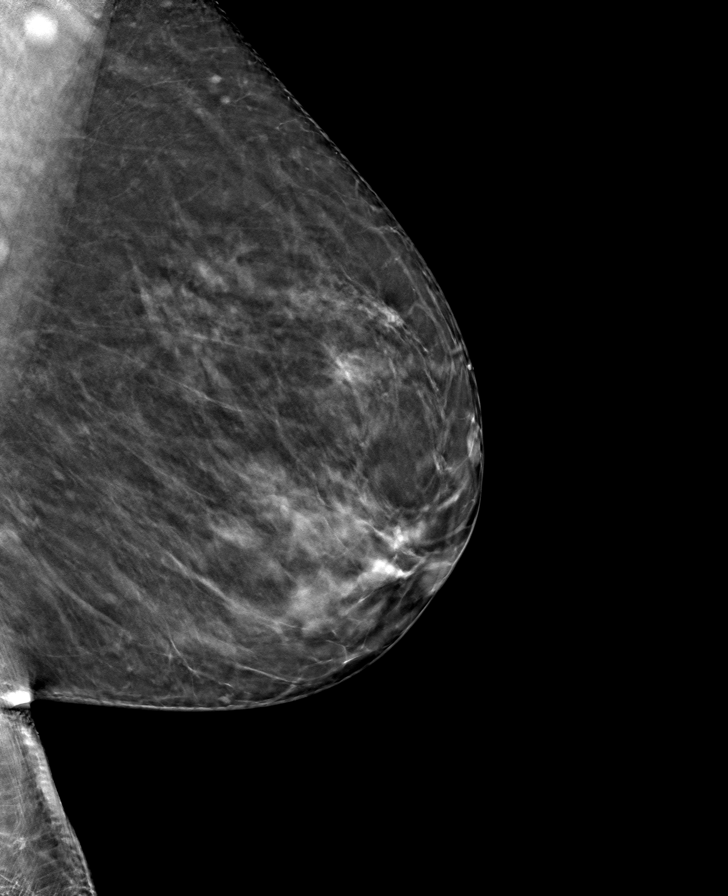

[L MLO tomo · tomo slice 33/66.0]
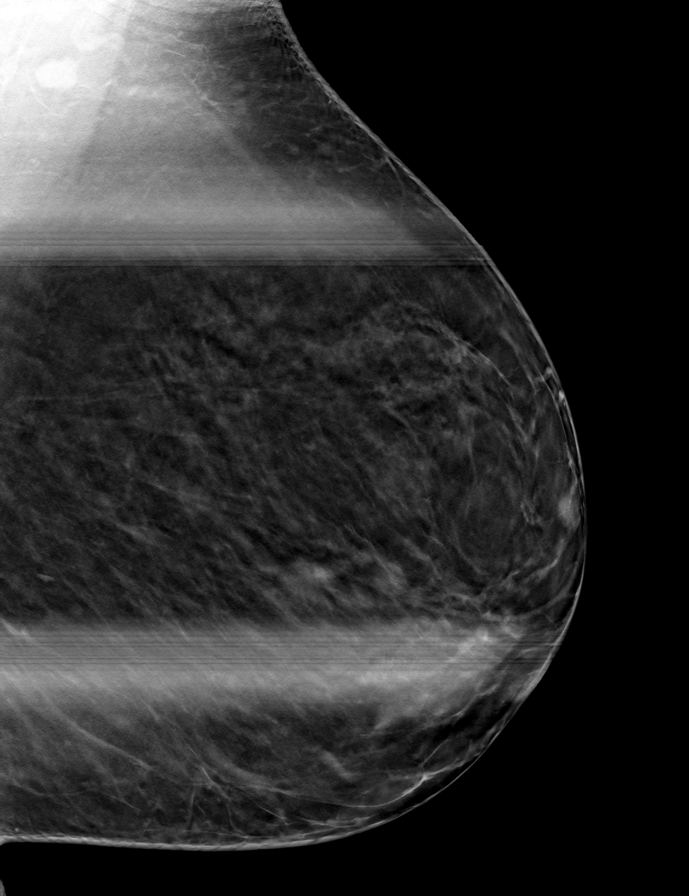

[R MLO tomo · tomo slice 33/64.0]
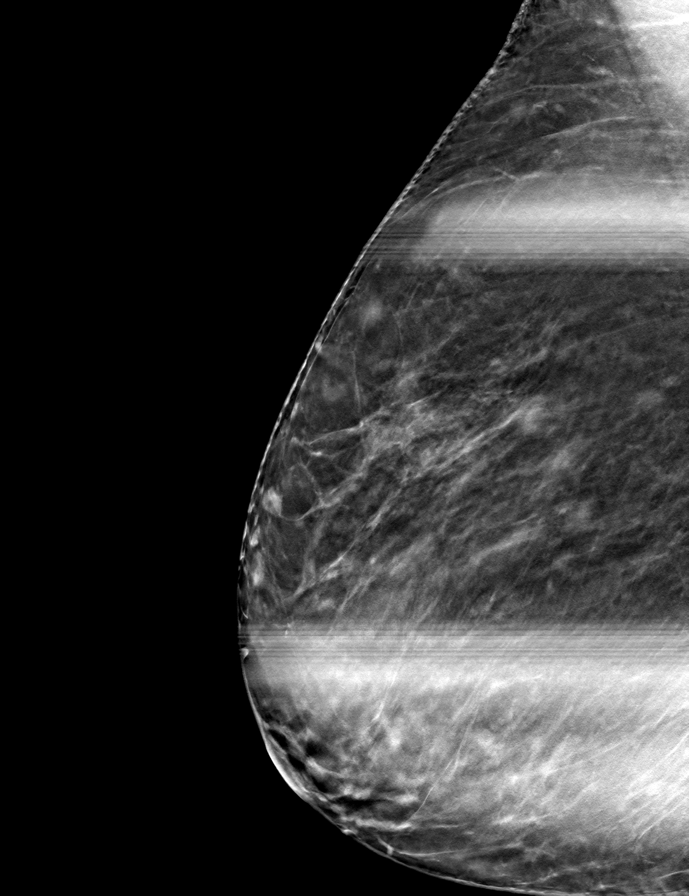

[8 of 24 positions shown; findings below may reference images not displayed]

ACR Breast Density Category c: The breast tissue is heterogeneously
dense, which may obscure small masses.
FINDINGS: The mass in the right breast may represent a lymph node not seen on
previous studies. No other interval changes in the right breast.

The asymmetry on the left is not seen on the 90 degree lateral view
but persists on the MLO view. Today's spot MLO view is similar in
appearance to a study from [DATE] and the increased
conspicuity this year is thought to be technical in nature. No other
suspicious findings.

On physical exam, no suspicious lumps are identified.

Targeted ultrasound is performed, showing a lymph node in the right
breast at [DATE], 7 cm from the nipple measuring 6 x 7 x 4 mm
correlating with the mammographically identified mass. The cortex of
this lymph node measures up to 2.6 mm which is within normal limits
but clearly increased from previous studies as the lymph node was
not clearly seen previously. No sonographic correlate for the left
breast mass is identified.
IMPRESSION: The right breast mass is a lymph node, larger compared to previous
studies. This correlates with the patient's history of lymphoma. If
it would change management, we would be happy to biopsy this lymph
node for the oncologist.

Probably benign left breast asymmetry.

RECOMMENDATION:
Six-month follow-up mammography of the left breast asymmetry. We
would be happy to biopsy the right intramammary node if it would
change clinical management.

I have discussed the findings and recommendations with the patient.
If applicable, a reminder letter will be sent to the patient
regarding the next appointment.

BI-RADS CATEGORY  3: Probably benign.

## 2022-02-27 IMAGING — US US BREAST*R* LIMITED INC AXILLA
1 series · 9 of 9 positions shown · non-contrast
Comparison: None.

CLINICAL DATA: The patient was called back from screening
mammography due to bilateral asymmetries. Of note, the patient has a
history of low-grade non-Hodgkin's lymphoma which is not currently
being treated.



[Series 1: us breast*right* limited inc axilla · 0.06mm/px · 9 of 9 slices shown]
[im 1/9]
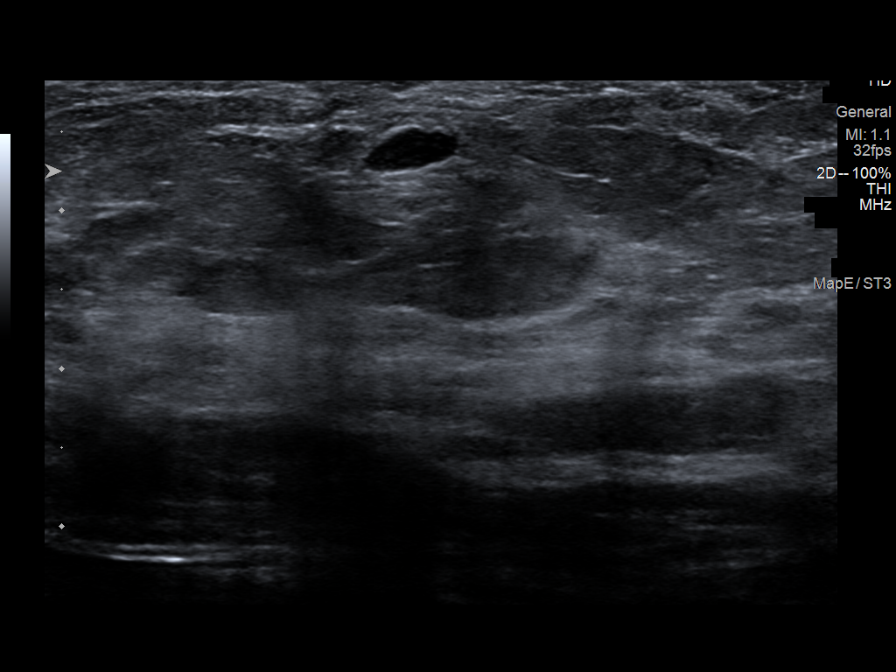
[im 2/9]
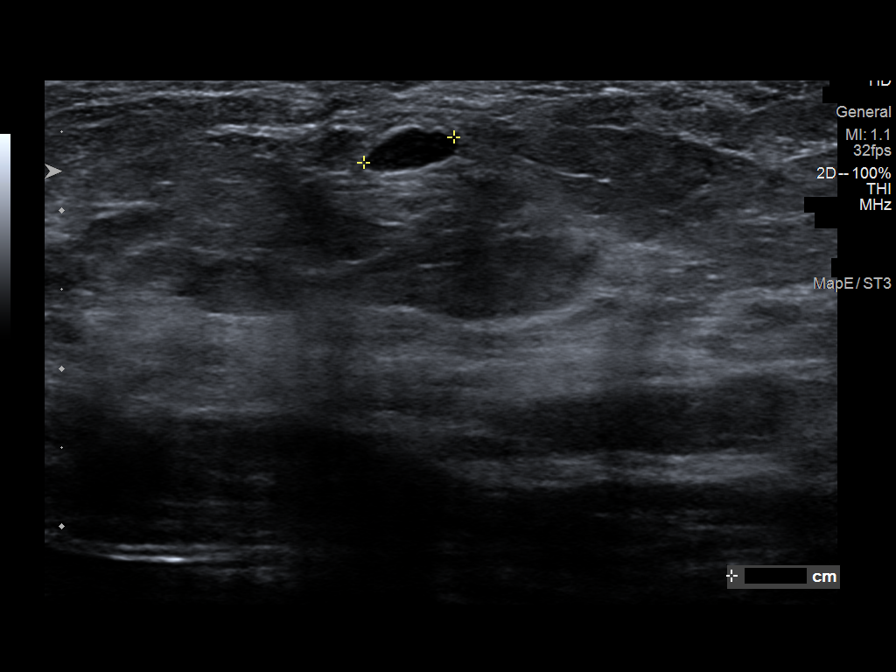
[im 3/9]
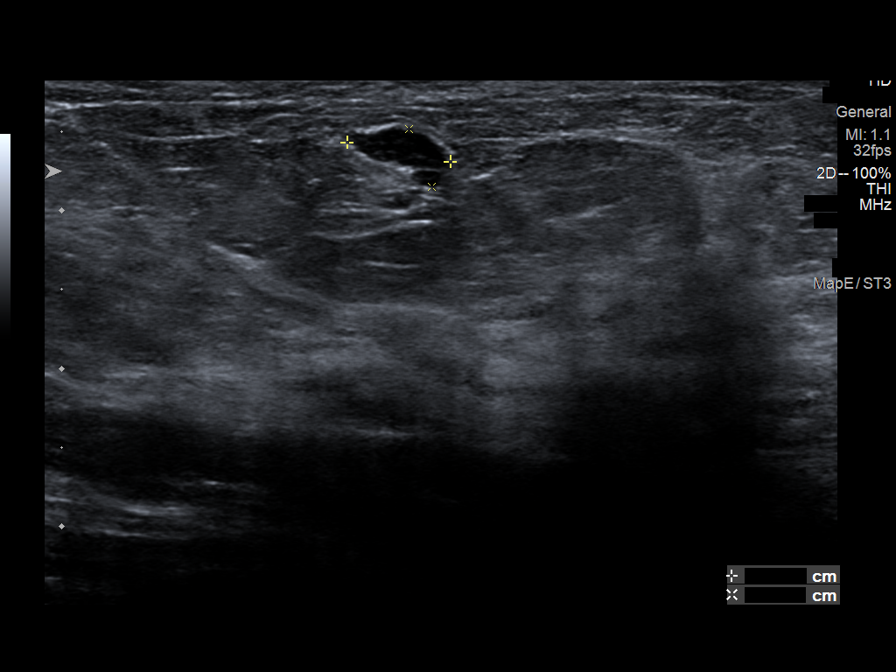
[im 4/9]
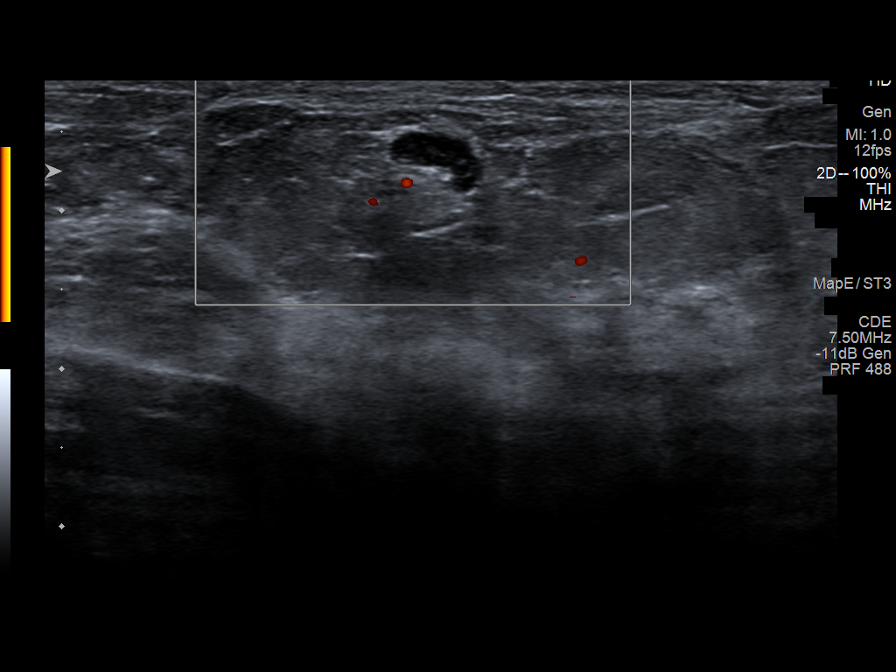
[im 5/9]
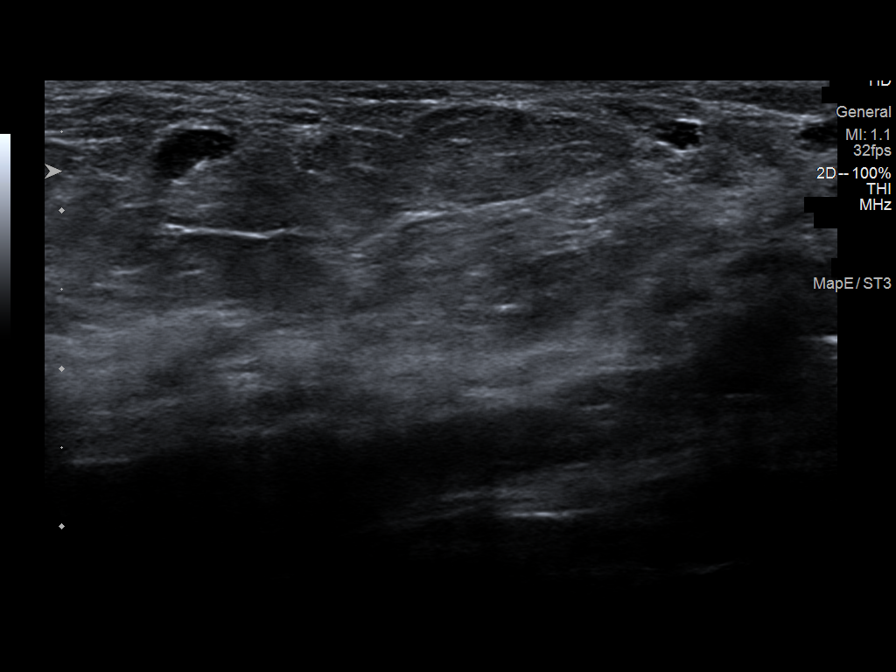
[im 6/9]
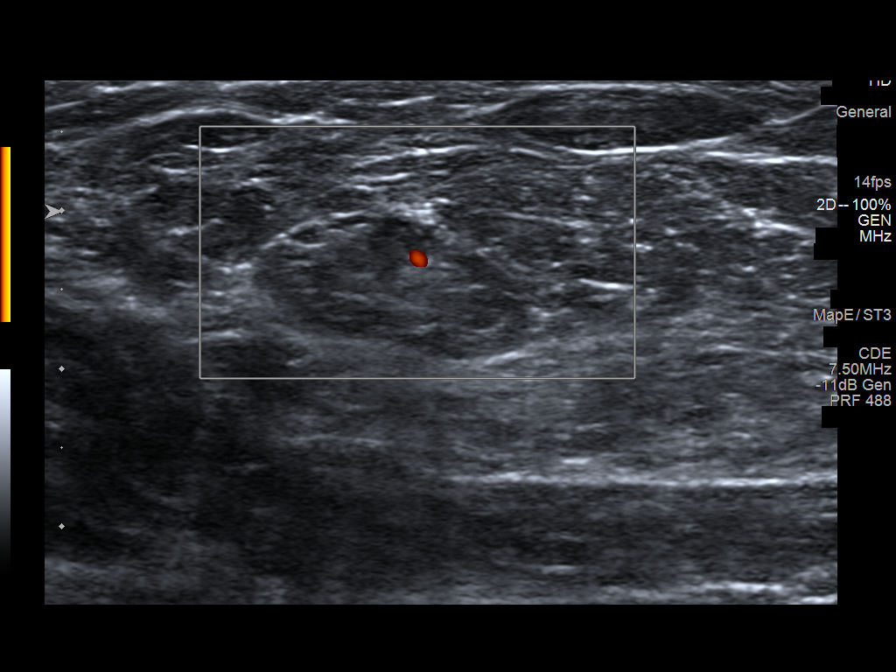
[im 7/9]
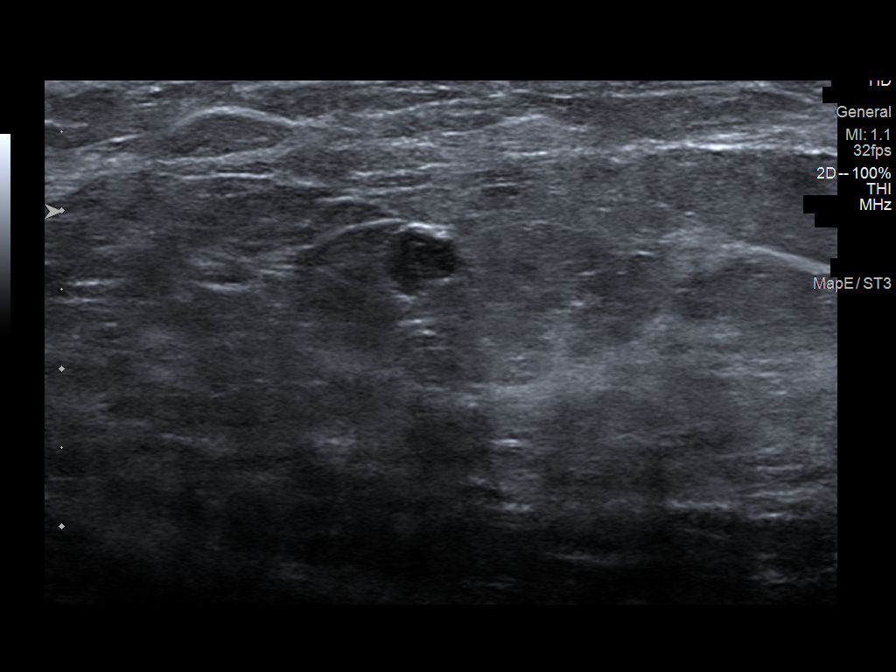
[im 8/9]
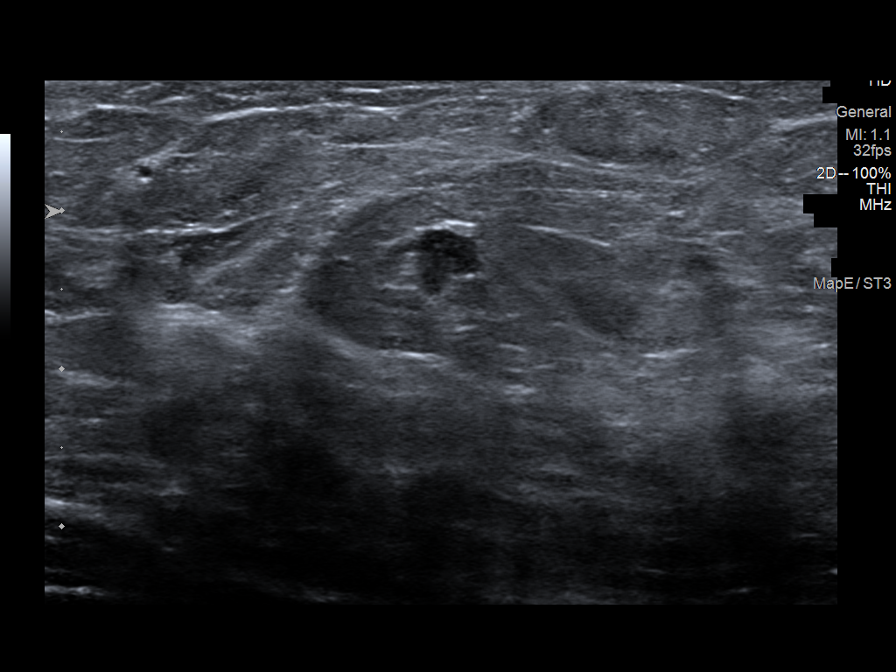
[im 9/9]
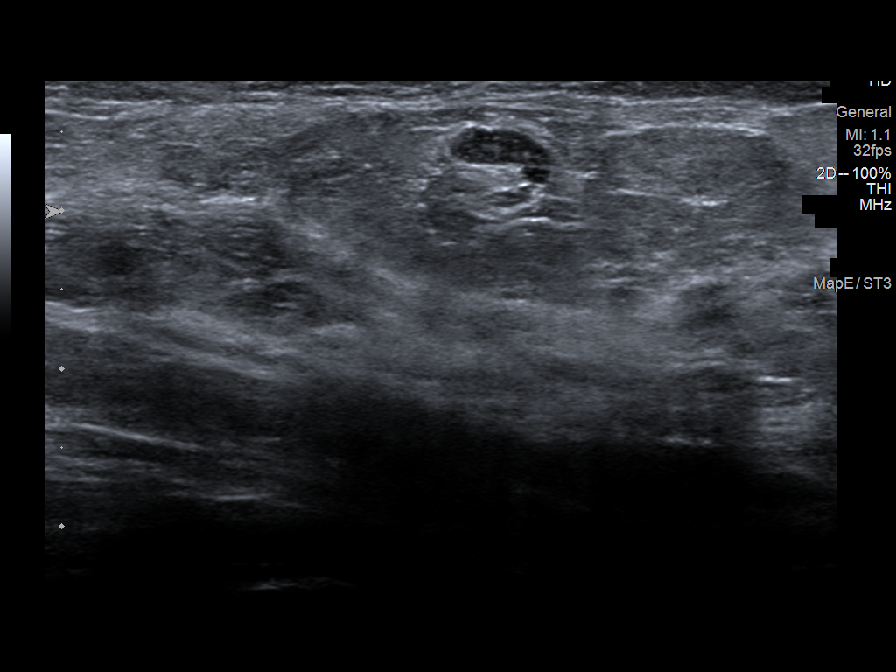

[9 of 9 positions shown; findings below may reference images not displayed]

ACR Breast Density Category c: The breast tissue is heterogeneously
dense, which may obscure small masses.
FINDINGS: The mass in the right breast may represent a lymph node not seen on
previous studies. No other interval changes in the right breast.

The asymmetry on the left is not seen on the 90 degree lateral view
but persists on the MLO view. Today's spot MLO view is similar in
appearance to a study from [DATE] and the increased
conspicuity this year is thought to be technical in nature. No other
suspicious findings.

On physical exam, no suspicious lumps are identified.

Targeted ultrasound is performed, showing a lymph node in the right
breast at [DATE], 7 cm from the nipple measuring 6 x 7 x 4 mm
correlating with the mammographically identified mass. The cortex of
this lymph node measures up to 2.6 mm which is within normal limits
but clearly increased from previous studies as the lymph node was
not clearly seen previously. No sonographic correlate for the left
breast mass is identified.
IMPRESSION: The right breast mass is a lymph node, larger compared to previous
studies. This correlates with the patient's history of lymphoma. If
it would change management, we would be happy to biopsy this lymph
node for the oncologist.

Probably benign left breast asymmetry.

RECOMMENDATION:
Six-month follow-up mammography of the left breast asymmetry. We
would be happy to biopsy the right intramammary node if it would
change clinical management.

I have discussed the findings and recommendations with the patient.
If applicable, a reminder letter will be sent to the patient
regarding the next appointment.

BI-RADS CATEGORY  3: Probably benign.

## 2022-02-27 IMAGING — US US BREAST*L* LIMITED INC AXILLA
1 series · 2 of 2 positions shown · non-contrast
Comparison: None.

CLINICAL DATA: The patient was called back from screening
mammography due to bilateral asymmetries. Of note, the patient has a
history of low-grade non-Hodgkin's lymphoma which is not currently
being treated.



[Series 1: us breast*left* limited inc axilla · 0.08mm/px · 2 of 2 slices shown]
[im 1/2]
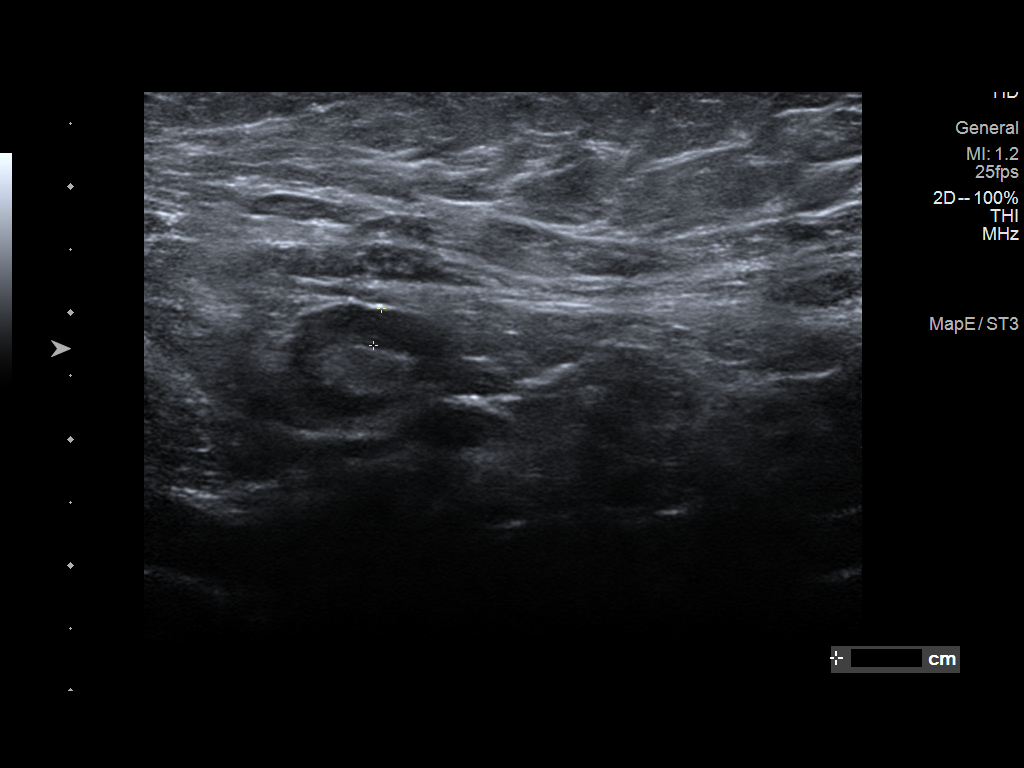
[im 2/2]
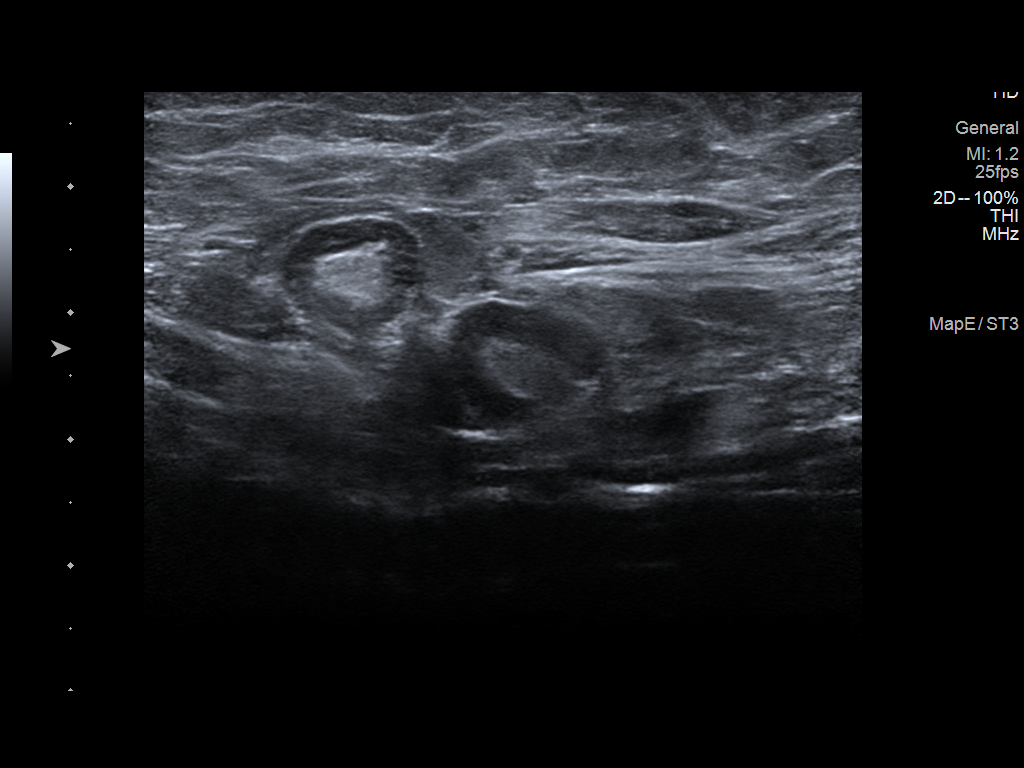

[2 of 2 positions shown; findings below may reference images not displayed]

ACR Breast Density Category c: The breast tissue is heterogeneously
dense, which may obscure small masses.
FINDINGS: The mass in the right breast may represent a lymph node not seen on
previous studies. No other interval changes in the right breast.

The asymmetry on the left is not seen on the 90 degree lateral view
but persists on the MLO view. Today's spot MLO view is similar in
appearance to a study from [DATE] and the increased
conspicuity this year is thought to be technical in nature. No other
suspicious findings.

On physical exam, no suspicious lumps are identified.

Targeted ultrasound is performed, showing a lymph node in the right
breast at [DATE], 7 cm from the nipple measuring 6 x 7 x 4 mm
correlating with the mammographically identified mass. The cortex of
this lymph node measures up to 2.6 mm which is within normal limits
but clearly increased from previous studies as the lymph node was
not clearly seen previously. No sonographic correlate for the left
breast mass is identified.
IMPRESSION: The right breast mass is a lymph node, larger compared to previous
studies. This correlates with the patient's history of lymphoma. If
it would change management, we would be happy to biopsy this lymph
node for the oncologist.

Probably benign left breast asymmetry.

RECOMMENDATION:
Six-month follow-up mammography of the left breast asymmetry. We
would be happy to biopsy the right intramammary node if it would
change clinical management.

I have discussed the findings and recommendations with the patient.
If applicable, a reminder letter will be sent to the patient
regarding the next appointment.

BI-RADS CATEGORY  3: Probably benign.

## 2022-03-11 ENCOUNTER — Other Ambulatory Visit: Payer: Medicare Other

## 2022-05-08 ENCOUNTER — Telehealth: Payer: Self-pay | Admitting: Family Medicine

## 2022-05-08 ENCOUNTER — Other Ambulatory Visit: Payer: Self-pay

## 2022-05-08 DIAGNOSIS — M19071 Primary osteoarthritis, right ankle and foot: Secondary | ICD-10-CM

## 2022-05-08 NOTE — Telephone Encounter (Signed)
Pt last seen in February, per pt discussed orthotics and PRP. ? ?Pt talked with oncologist and decided to delay PRP as she is having other issues at this time. ? ?Pt would like to be referred for Orthotics, she was unsure if she needed bilateral inserts or not. ?

## 2022-05-08 NOTE — Telephone Encounter (Signed)
PT scheduled for orthotics visit at [facility] 05/15/2022. ?

## 2022-05-15 ENCOUNTER — Encounter: Payer: Medicare Other | Admitting: Family Medicine

## 2022-05-20 ENCOUNTER — Ambulatory Visit (INDEPENDENT_AMBULATORY_CARE_PROVIDER_SITE_OTHER): Payer: Medicare Other | Admitting: Family Medicine

## 2022-05-20 VITALS — BP 140/84 | Ht 65.0 in | Wt 158.0 lb

## 2022-05-20 DIAGNOSIS — M19071 Primary osteoarthritis, right ankle and foot: Secondary | ICD-10-CM | POA: Diagnosis present

## 2022-05-21 ENCOUNTER — Encounter: Payer: Self-pay | Admitting: Family Medicine
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
## 2022-09-03 ENCOUNTER — Ambulatory Visit
Admission: RE | Admit: 2022-09-03 | Discharge: 2022-09-03 | Disposition: A | Discharge status: Home / Self Care | Payer: Medicare Other | Source: Ambulatory Visit | Attending: Obstetrics and Gynecology | Admitting: Obstetrics and Gynecology

## 2022-09-03 DIAGNOSIS — N6310 Unspecified lump in the right breast, unspecified quadrant: Secondary | ICD-10-CM

## 2022-09-05 ENCOUNTER — Other Ambulatory Visit: Payer: Self-pay | Admitting: Obstetrics and Gynecology

## 2022-09-12 ENCOUNTER — Other Ambulatory Visit: Payer: Self-pay | Admitting: Obstetrics and Gynecology

## 2022-09-12 DIAGNOSIS — N6310 Unspecified lump in the right breast, unspecified quadrant: Secondary | ICD-10-CM

## 2022-09-12 DIAGNOSIS — N6489 Other specified disorders of breast: Secondary | ICD-10-CM

## 2022-10-01 ENCOUNTER — Ambulatory Visit
Admission: RE | Admit: 2022-10-01 | Discharge: 2022-10-01 | Disposition: A | Discharge status: Home / Self Care | Payer: Medicare Other | Source: Ambulatory Visit | Attending: Obstetrics and Gynecology | Admitting: Obstetrics and Gynecology

## 2022-10-01 ENCOUNTER — Other Ambulatory Visit: Payer: Self-pay | Admitting: Obstetrics and Gynecology

## 2022-10-01 DIAGNOSIS — N6489 Other specified disorders of breast: Secondary | ICD-10-CM

## 2022-10-01 DIAGNOSIS — N6310 Unspecified lump in the right breast, unspecified quadrant: Secondary | ICD-10-CM

## 2022-10-01 DIAGNOSIS — N6320 Unspecified lump in the left breast, unspecified quadrant: Secondary | ICD-10-CM

## 2022-10-03 ENCOUNTER — Ambulatory Visit (INDEPENDENT_AMBULATORY_CARE_PROVIDER_SITE_OTHER): Payer: Medicare Other | Admitting: *Deleted

## 2022-10-03 DIAGNOSIS — Z23 Encounter for immunization: Secondary | ICD-10-CM | POA: Diagnosis not present

## 2022-10-08 ENCOUNTER — Ambulatory Visit
Admission: RE | Admit: 2022-10-08 | Discharge: 2022-10-08 | Disposition: A | Discharge status: Home / Self Care | Payer: Medicare Other | Source: Ambulatory Visit | Attending: Obstetrics and Gynecology | Admitting: Obstetrics and Gynecology

## 2022-10-08 DIAGNOSIS — N6320 Unspecified lump in the left breast, unspecified quadrant: Secondary | ICD-10-CM

## 2022-10-09 ENCOUNTER — Other Ambulatory Visit: Payer: Medicare Other

## 2022-10-15 ENCOUNTER — Telehealth: Payer: Self-pay | Admitting: Family Medicine

## 2022-10-15 NOTE — Telephone Encounter (Signed)
[place] with me if ok with Dr. Volanda Napoleon

## 2022-10-15 NOTE — Telephone Encounter (Signed)
Pt  call and want to change from dr.Bank to dr.Michael

## 2022-10-17 NOTE — Telephone Encounter (Signed)
Ok

## 2022-10-18 ENCOUNTER — Ambulatory Visit (INDEPENDENT_AMBULATORY_CARE_PROVIDER_SITE_OTHER): Payer: Medicare Other | Admitting: Family Medicine

## 2022-10-18 ENCOUNTER — Encounter: Payer: Self-pay | Admitting: Family Medicine

## 2022-10-18 VITALS — BP 120/68 | HR 77 | Temp 98.3°F | Ht 65.0 in | Wt 157.2 lb

## 2022-10-18 DIAGNOSIS — M71331 Other bursal cyst, right wrist: Secondary | ICD-10-CM | POA: Diagnosis not present

## 2022-10-18 DIAGNOSIS — Z13220 Encounter for screening for lipoid disorders: Secondary | ICD-10-CM

## 2022-10-18 LAB — LIPID PANEL
Cholesterol: 196 mg/dL (ref 0–200)
HDL: 39.1 mg/dL (ref >39.00)
LDL Cholesterol: 143 mg/dL — ABNORMAL HIGH (ref 0–99)
NonHDL: 156.61
Total CHOL/HDL Ratio: 5
Triglycerides: 69 mg/dL (ref 0.0–149.0)
VLDL: 13.8 mg/dL (ref 0.0–40.0)
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
## 2022-10-18 NOTE — Assessment & Plan Note (Signed)
Benign finding, reassured patient, she reports she has an appointment with a plastic surgeon next week so I encouraged her to ask him about the cyst on her wrist also.
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
## 2022-10-24 NOTE — Progress Notes (Signed)
LDL is increased from previous labs, HDL is lower. At this point is would be recommended that she start a cholesterol medication. Please ask patient if she would be agreeable to this. If not then she should try taking Omega 3 fatty acid supplements OTC

## 2022-10-29 ENCOUNTER — Encounter: Payer: Self-pay | Admitting: Family Medicine

## 2022-12-02 ENCOUNTER — Telehealth: Payer: Self-pay | Admitting: Family Medicine

## 2022-12-02 NOTE — Telephone Encounter (Signed)
Spoke with patient schedule Medicare Annual Wellness Visit (AWV) either virtually or in office.   Patient didn't want to schedule right now.  She would like a call back in June to schedule    awvi 11/29/20 per [facility]  please schedule with Nurse Health Adviser   45 min for awv-i and in office appointments 30 min for awv-s  phone/virtual appointments

## 2022-12-12 NOTE — Progress Notes (Unsigned)
Paula [name] Phone: [phone] Subjective:    I'm seeing this patient by the request  of:  Paula Conners, MD  CC: Right ankle pain  JIR:[id number]  02/11/2022 Patient does have the arthritic changes noted.  Unfortunately continues to have difficulty.  The patient does have hypoechoic changes noted of the posterior tibialis with intersubstance tearing that I think is also contributing.  Patient does have a history of CLL and I do feel that advanced imaging with an MRI is warranted.  Patient does have some postsurgical changes from greater than 40 years ago that I will make the anatomy is somewhat different and difficult to assess appropriately.  Depending on findings patient could be a candidate for PRP but this could be secondary to the arthritis or the tendon of the posterior tibialis.  This would change if we do leukocyte poor versus leukocyte rich mixture.  Depending on findings of the MRI we will discuss next step treatment.     Update 12/17/2022 GEOFFREY [name] is a 71 y.o. female coming in with complaint of R ankle pain.  Past medical history significant for CLL and follicular lymphoma.  Patient states that she was in [name] in September and this aggravated her ankle due to pushing hard into the brake pedal. Pain is medial and lateral malleolus.   Patient did see me nearly a year ago when patient was having continued ankle pain.  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  Went to MD Paula [name] had repeat MRI   Findings:  The bone marrow in the distal diaphysis of the right fibula above the lateral malleolus is replaced showing low T1 signal intensity, high T2 signal intensity, and contrast enhancement. See series 3 image 8, series 5 image 8, and  series 9 image 18. This portion of the fibula does not show focal radionuclide accumulation on the PET/CT examination. Also, it is generally unremarkable by conventional radiography. Because there are no corollary findings by PET CT, it is difficult to attribute this fibular abnormality to cellular infiltration by one of the patient's hematopoietic diseases.   There are also patchy bone marrow alterations in the distal tibia (series 3 and series 6 image 13) and in the calcaneus (series 3 and series 6 image 13). These are nonspecific.   The talonavicular joint shows osteoarthritis with near total cartilage loss, subcortical cyst formation, and bone marrow edema. See series 3 and series 6 image 16 in each series. This may be the cause of the foot pain.      Past Medical History:  Diagnosis Date   CARPAL TUNNEL SYNDROME, MILD 12/05/2008   Chronic cough - seeing pulmonologist in [place], [facility] 03/14/2014   HSV 03/01/2010   Leg skin lesion, left 01/30/2015   precancerous cells per patient   OSTEOPENIA 12/05/2008   PARESTHESIA 09/01/2009   Waldenstroms macroglobulinemia - followed by Paula [name], [place], [place] 03/15/2014   Past Surgical History:  Procedure Laterality Date   CHOLECYSTECTOMY  02/2017   LEG SKIN LESION  BIOPSY / EXCISION Left 01/30/2015   Dr Paula [name]-precancerous cells per patient   [place]   Social History   Socioeconomic History  Marital status: Married    Spouse name: Not on file   Number of children: Not on file   Years of education: Not on file   Highest education level: Not on file  Occupational History   Not on file  Tobacco Use   Smoking status: Never   Smokeless tobacco: Never  Substance and Sexual Activity   Alcohol use: Yes    Alcohol/week: 6.0 standard drinks of alcohol    Types: 6 Glasses of wine per week   Drug use: No   Sexual activity: Not on file   Other Topics Concern   Not on file  Social History Narrative   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:      Spiritual Beliefs:      Lifestyle:            Social Determinants of Radio broadcast assistant Strain: Not on file  Food Insecurity: Not on file  Transportation Needs: Not on file  Physical Activity: Not on file  Stress: Not on file  Social Connections: Not on file   Allergies  Allergen Reactions   Ciprofloxacin Nausea Only   Family History  Problem Relation Age of Onset   Hypertension Mother    Hypertension Brother    Colon cancer Father 47   Hypertension Son    Cancer Neg Hx        lung ca - mother's side   Arthritis Neg Hx        osteo - mother's side   Breast cancer Neg Hx          Current Outpatient Medications (Other):    fluorouracil (EFUDEX) 5 % cream, Apply topically.   Reviewed prior external information including notes and imaging from  primary care provider As well as notes that were available from care everywhere and other healthcare systems.  Past medical history, social, surgical and family history all reviewed in electronic medical record.  No pertanent information unless stated regarding to the chief complaint.   Review of Systems:  No headache, visual changes, nausea, vomiting, diarrhea, constipation, dizziness, abdominal pain, skin rash, fevers, chills, night sweats, weight loss, swollen lymph nodes, body aches, joint swelling, chest pain, shortness of breath, mood changes. POSITIVE muscle aches  Objective  Blood pressure 118/78, pulse 77, height '5\' 5"'$  (1.651 m), weight 158 lb (71.7 kg), SpO2 98 %.   General: No apparent distress alert and oriented x3 mood and affect normal, dressed appropriately.  HEENT: Pupils equal, extraocular movements intact  Respiratory: Patient's speak in full sentences and does not appear short of breath  Cardiovascular: No lower extremity edema, non tender, no erythema  Severely antalgic  gait noted.  Patient does have significant abnormality of the right lower extremity with atrophy of the distal calf but patient does have arthritic changes severely of the medial tibial talar area and midfoot.  Joint swelling noted in this area as well.  Tenderness to palpation.  Limited range of motion of the ankle in all planes  Procedure: Real-time Ultrasound Guided Injection of right talonavicular joint Device: GE Logiq Q7 Ultrasound guided injection is preferred based studies that show increased duration, increased effect, greater accuracy, decreased procedural pain, increased response rate, and decreased cost with ultrasound guided versus blind injection.  Verbal informed consent obtained.  Time-out conducted.  Noted no overlying erythema, induration, or other signs of local infection.  Skin prepped in a sterile fashion.  Local anesthesia: Topical Ethyl chloride.  With sterile technique and under real time ultrasound guidance: With a 25-gauge half inch needle injected with 0.5 cc of 0.5% Marcaine and 0.5 cc of Kenalog 40 mg/mL Completed without difficulty  Pain immediately resolved suggesting accurate placement of the medication.  Advised to call if fevers/chills, erythema, induration, drainage, or persistent bleeding.  Impression: Technically successful ultrasound guided injection.    Impression and Recommendations:     The above documentation has been reviewed and is accurate and complete Lyndal Pulley, DO

## 2022-12-17 ENCOUNTER — Ambulatory Visit: Payer: Self-pay

## 2022-12-17 ENCOUNTER — Ambulatory Visit (INDEPENDENT_AMBULATORY_CARE_PROVIDER_SITE_OTHER): Payer: Medicare Other | Admitting: Family Medicine

## 2022-12-17 ENCOUNTER — Encounter: Payer: Self-pay | Admitting: Family Medicine

## 2022-12-17 VITALS — BP 118/78 | HR 77 | Ht 65.0 in | Wt 158.0 lb

## 2022-12-17 DIAGNOSIS — M25571 Pain in right ankle and joints of right foot: Secondary | ICD-10-CM

## 2022-12-17 DIAGNOSIS — C9110 Chronic lymphocytic leukemia of B-cell type not having achieved remission: Secondary | ICD-10-CM | POA: Diagnosis not present

## 2022-12-17 DIAGNOSIS — G8929 Other chronic pain: Secondary | ICD-10-CM

## 2022-12-17 DIAGNOSIS — M19071 Primary osteoarthritis, right ankle and foot: Secondary | ICD-10-CM

## 2022-12-17 MED ORDER — AMBULATORY NON FORMULARY MEDICATION: 1.0000 [IU] | Freq: Once | 0 refills | Status: AC

## 2022-12-17 NOTE — Assessment & Plan Note (Signed)
Patient given injection and tolerated the procedure well.  We discussed at great length with patient and having an active blood cancer we did not know for sure about the PRP at this time.  We did discuss with her we can do a leukocyte poor mixture that with patient having that we could consider it but still would like her to talk more to her hematologist and oncologist.  I do think that there is unfortunately a risk is a possibility.  We discussed with patient about proper shoes and due to the instability of the ankle we will refer patient to the [facility] to discuss custom bracing to potentially help as well.  Patient still has the abnormality noted of the distal tibia and fibula from previous surgeries still being worked up going.  Above did show the evaluations of these areas of this and still somewhat nonspecific.  Patient will follow-up with me again in 6 to 8 weeks.

## 2022-12-17 NOTE — Patient Instructions (Addendum)
Injected ankle today [facility] will call you Voltaren gel Ice after activity See me again in 3 months

## 2022-12-17 NOTE — Addendum Note (Signed)
Addended by: Carmie Kanner on: 12/17/2022 03:12 PM   Modules accepted: Orders

## 2022-12-26 ENCOUNTER — Other Ambulatory Visit: Payer: Self-pay | Admitting: Internal Medicine

## 2022-12-26 DIAGNOSIS — N6310 Unspecified lump in the right breast, unspecified quadrant: Secondary | ICD-10-CM

## 2023-01-10 ENCOUNTER — Telehealth: Payer: Self-pay | Admitting: Family Medicine

## 2023-01-10 NOTE — Telephone Encounter (Signed)
Spoke with patient to schedule Medicare Annual Wellness Visit (AWV) either virtually or in office.   Patient stated she would like a call back in sept     awvi 11/29/20 per [facility]  please schedule with Nurse Health Adviser   45 min for awv-i  in office appointments 30 min for awv-s & awv-i in office/ phone/virtual appointments

## 2023-03-03 ENCOUNTER — Encounter: Payer: Self-pay | Admitting: Family Medicine

## 2023-03-18 ENCOUNTER — Ambulatory Visit: Payer: Medicare Other | Admitting: Family Medicine

## 2023-04-06 ENCOUNTER — Emergency Department (HOSPITAL_COMMUNITY)
Admission: EM | Admit: 2023-04-06 | Discharge: 2023-04-06 | Disposition: A | Discharge status: Home / Self Care | Payer: Medicare Other | Attending: Emergency Medicine | Admitting: Emergency Medicine

## 2023-04-06 ENCOUNTER — Emergency Department (HOSPITAL_BASED_OUTPATIENT_CLINIC_OR_DEPARTMENT_OTHER): Payer: Medicare Other

## 2023-04-06 ENCOUNTER — Emergency Department (HOSPITAL_COMMUNITY): Payer: Medicare Other

## 2023-04-06 ENCOUNTER — Other Ambulatory Visit: Payer: Self-pay

## 2023-04-06 ENCOUNTER — Encounter (HOSPITAL_COMMUNITY): Payer: Self-pay

## 2023-04-06 DIAGNOSIS — M79604 Pain in right leg: Secondary | ICD-10-CM | POA: Insufficient documentation

## 2023-04-06 DIAGNOSIS — C8290 Follicular lymphoma, unspecified, unspecified site: Secondary | ICD-10-CM | POA: Insufficient documentation

## 2023-04-06 DIAGNOSIS — C9110 Chronic lymphocytic leukemia of B-cell type not having achieved remission: Secondary | ICD-10-CM | POA: Diagnosis not present

## 2023-04-06 DIAGNOSIS — R52 Pain, unspecified: Secondary | ICD-10-CM

## 2023-04-06 DIAGNOSIS — C8590 Non-Hodgkin lymphoma, unspecified, unspecified site: Secondary | ICD-10-CM | POA: Insufficient documentation

## 2023-04-06 DIAGNOSIS — C880 Waldenstrom macroglobulinemia: Secondary | ICD-10-CM | POA: Insufficient documentation

## 2023-04-06 LAB — CBC WITH DIFFERENTIAL/PLATELET
Abs Immature Granulocytes: 0.08 10*3/uL — ABNORMAL HIGH (ref 0.00–0.07)
Basophils Absolute: 0.1 10*3/uL (ref 0.0–0.1)
Basophils Relative: 0 %
Eosinophils Absolute: 0.1 10*3/uL (ref 0.0–0.5)
Eosinophils Relative: 0 %
HCT: 31.6 % — ABNORMAL LOW (ref 36.0–46.0)
Hemoglobin: 10.2 g/dL — ABNORMAL LOW (ref 12.0–15.0)
Immature Granulocytes: 0 %
Lymphocytes Relative: 79 %
Lymphs Abs: 19.5 10*3/uL — ABNORMAL HIGH (ref 0.7–4.0)
MCH: 32.4 pg (ref 26.0–34.0)
MCHC: 32.3 g/dL (ref 30.0–36.0)
MCV: 100.3 fL — ABNORMAL HIGH (ref 80.0–100.0)
Monocytes Absolute: 2.8 10*3/uL — ABNORMAL HIGH (ref 0.1–1.0)
Monocytes Relative: 11 %
Neutro Abs: 2.5 10*3/uL (ref 1.7–7.7)
Neutrophils Relative %: 10 %
Platelets: 113 10*3/uL — ABNORMAL LOW (ref 150–400)
RBC: 3.15 MIL/uL — ABNORMAL LOW (ref 3.87–5.11)
RDW: 16.4 % — ABNORMAL HIGH (ref 11.5–15.5)
Smear Review: DECREASED
WBC: 25.1 10*3/uL — ABNORMAL HIGH (ref 4.0–10.5)
nRBC: 0.1 % (ref 0.0–0.2)

## 2023-04-06 LAB — BASIC METABOLIC PANEL
Anion gap: 7 (ref 5–15)
BUN: 23 mg/dL (ref 8–23)
CO2: 20 mmol/L — ABNORMAL LOW (ref 22–32)
Calcium: 9.2 mg/dL (ref 8.9–10.3)
Chloride: 107 mmol/L (ref 98–111)
Creatinine, Ser: 0.93 mg/dL (ref 0.44–1.00)
GFR, Estimated: >60 mL/min (ref >60)
Glucose, Bld: 107 mg/dL — ABNORMAL HIGH (ref 70–99)
Potassium: 4.3 mmol/L (ref 3.5–5.1)
Sodium: 134 mmol/L — ABNORMAL LOW (ref 135–145)

## 2023-04-06 MED ORDER — OXYCODONE-ACETAMINOPHEN 5-325 MG PO TABS: ORAL_TABLET | ORAL | 0 refills | Status: DC

## 2023-04-06 MED ORDER — OXYCODONE-ACETAMINOPHEN 5-325 MG PO TABS: 1.0000 | ORAL_TABLET | Freq: Once | ORAL | Status: DC

## 2023-04-06 MED ORDER — LIDOCAINE 5 % EX PTCH: 1.0000 | MEDICATED_PATCH | CUTANEOUS | 0 refills | Status: DC

## 2023-04-06 MED ORDER — LIDOCAINE 5 % EX PTCH
1.0000 | MEDICATED_PATCH | CUTANEOUS | Status: DC
  Administered 2023-04-06: 1 via TRANSDERMAL
  Filled 2023-04-06: qty 1

## 2023-04-06 MED ORDER — HYDROCODONE-ACETAMINOPHEN 5-325 MG PO TABS
1.0000 | ORAL_TABLET | Freq: Once | ORAL | Status: AC
  Administered 2023-04-06: 1 via ORAL
  Filled 2023-04-06: qty 1

## 2023-04-06 NOTE — ED Triage Notes (Signed)
Patient reports right thigh pain on Friday and pain hasn't stopped since. Has tried warm compress and taking otc meds with minimal relief. Denies any injury. States she had eye surgery recently and hasn't been moving as much.

## 2023-04-06 NOTE — Progress Notes (Signed)
Right lower extremity venous duplex has been completed. Preliminary results can be found in CV Proc through chart review.  Results were given to Dr. Estell Harpin.  04/06/23 9:03 AM Olen Cordial RVT

## 2023-04-06 NOTE — ED Notes (Signed)
Assumed care of patient. Patient resting comfortably in bed with no signs of acute distress noted. Waiting on vascular study to be done on leg. No other complaints.

## 2023-04-06 NOTE — Discharge Instructions (Signed)
Follow-up with your family doctor later this week for recheck °

## 2023-04-06 NOTE — ED Provider Notes (Signed)
[place] EMERGENCY DEPARTMENT AT [facility] Provider Note   CSN: [id number] Arrival date & time: 04/06/23  0407     History  Chief Complaint  Patient presents with   Leg Pain    Paula [name] is a 72 y.o. female.  The history is provided by the patient and medical records.  Leg Pain Paula [name] is a 72 y.o. female who presents to the Emergency Department complaining of leg pain.  She presents to the emergency department complaining of right leg pain located from the right mid thigh to just proximal to the knee that started at 7 PM last night.  Pain is constant but worse with walking.  No related injuries.  Most of the pain is anterior in the leg.  She has been taking Tylenol and Excedrin according to package instructions for pain but she only gets some relief for about an hour.  No associated fever, chest pain, difficulty breathing, nausea, vomiting.  She did have eye surgery 2 weeks ago and she is concerned for possible blood clot.  She has been slightly less active than her baseline due to the recent surgery.  She has non-Hodgkin's lymphoma, CLL, follicular lymphoma and Waldenstrm's and plans to start chemotherapy on May 6.  She has no personal history of blood clots and does not take any current medications.      Home Medications Prior to Admission medications   Medication Sig Start Date End Date Taking? Authorizing Provider  lidocaine (LIDODERM) 5 % Place 1 patch onto the skin daily. Remove & Discard patch within 12 hours or as directed by MD 04/06/23  Yes Tilden Fossa, MD  fluorouracil (EFUDEX) 5 % cream Apply topically. 03/27/22   [provider]      Allergies    Ciprofloxacin    Review of Systems   Review of Systems  All other systems reviewed and are negative.   Physical Exam Updated Vital Signs BP (!) 147/77 (BP Location: Right Arm)   Pulse 89   Temp 98.2 F (36.8 C) (Oral)   Resp 18   Ht 5' 5.5" (1.664 m)   Wt 69.9 kg   SpO2 98%    BMI 25.24 kg/m  Physical Exam Vitals and nursing note reviewed.  Constitutional:      Appearance: She is well-developed.  HENT:     Head: Normocephalic and atraumatic.  Cardiovascular:     Rate and Rhythm: Normal rate and regular rhythm.  Pulmonary:     Effort: Pulmonary effort is normal. No respiratory distress.  Abdominal:     Palpations: Abdomen is soft.     Tenderness: There is no abdominal tenderness. There is no guarding or rebound.  Musculoskeletal:        General: No tenderness.     Comments: 2+ DP pulses bilaterally.  There is no soft tissue swelling or tenderness to palpation throughout the right lower extremity.  She is able to flex and extend at the ankle, knee and hip.  Skin:    General: Skin is warm and dry.  Neurological:     Mental Status: She is alert and oriented to person, place, and time.     Comments: 5 out of 5 strength in bilateral lower extremities  Psychiatric:        Behavior: Behavior normal.     ED Results / Procedures / Treatments   Labs (all labs ordered are listed, but only abnormal results are displayed) Labs Reviewed  BASIC METABOLIC PANEL -  Abnormal; Notable for the following components:      Result Value   Sodium 134 (*)    CO2 20 (*)    Glucose, Bld 107 (*)    All other components within normal limits  CBC WITH DIFFERENTIAL/PLATELET - Abnormal; Notable for the following components:   WBC 25.1 (*)    RBC 3.15 (*)    Hemoglobin 10.2 (*)    HCT 31.6 (*)    MCV 100.3 (*)    RDW 16.4 (*)    Platelets 113 (*)    All other components within normal limits    EKG None  Radiology DG Femur Min 2 Views Right  Result Date: 04/06/2023 CLINICAL DATA:  Right thigh pain without injury. EXAM: RIGHT FEMUR 2 VIEWS COMPARISON:  None Available. FINDINGS: There is no evidence of fracture or other focal bone lesions. Soft tissues are unremarkable. There is enthesopathic change of the greater trochanter. The right hip joint space is maintained. At the  knee, there is femorotibial degenerative arthrosis with joint space loss greatest medially. IMPRESSION: 1. No evidence of fracture or other focal bone lesion. 2. Right knee DJD. Electronically Signed   By: Almira Bar M.D.   On: 04/06/2023 06:24    Procedures Procedures    Medications Ordered in ED Medications  lidocaine (LIDODERM) 5 % 1 patch (1 patch Transdermal Patch Applied 04/06/23 0549)  HYDROcodone-acetaminophen (NORCO/VICODIN) 5-325 MG per tablet 1 tablet (1 tablet Oral Given 04/06/23 0549)    ED Course/ Medical Decision Making/ A&P                             Medical Decision Making Amount and/or Complexity of Data Reviewed Labs: ordered. Radiology: ordered.  Risk Prescription drug management.   Patient here for evaluation of atraumatic right leg pain.  She is well-perfused on examination with no evidence of soft tissue infection.  CBC with stable anemia and leukocytosis.  BMP is at her baseline.  Plain films of the femur are negative for acute lesion-images personally reviewed and interpreted, agree with radiologist interpretation.  Plan to obtain vascular ultrasound to rule out DVT.  Patient care transferred pending at vascular ultrasound.        Final Clinical Impression(s) / ED Diagnoses Final diagnoses:  Right leg pain    Rx / DC Orders ED Discharge Orders          Ordered    lidocaine (LIDODERM) 5 %  Every 24 hours        04/06/23 0706              Tilden Fossa, MD 04/06/23 [phone]

## 2023-04-07 ENCOUNTER — Ambulatory Visit (INDEPENDENT_AMBULATORY_CARE_PROVIDER_SITE_OTHER): Payer: Medicare Other | Admitting: Sports Medicine

## 2023-04-07 VITALS — BP 148/78 | HR 82 | Ht 65.0 in | Wt 154.0 lb

## 2023-04-07 DIAGNOSIS — M898X5 Other specified disorders of bone, thigh: Secondary | ICD-10-CM | POA: Diagnosis not present

## 2023-04-07 DIAGNOSIS — C9110 Chronic lymphocytic leukemia of B-cell type not having achieved remission: Secondary | ICD-10-CM | POA: Diagnosis not present

## 2023-04-07 MED ORDER — MELOXICAM 15 MG PO TABS: 15.0000 mg | ORAL_TABLET | Freq: Every day | ORAL | 0 refills | Status: DC

## 2023-04-07 NOTE — Progress Notes (Addendum)
Paula [name] D.[name] [address] [place] [address] Phone: [phone]   Assessment and Plan:     1. Pain in right femur -Acute, complicated, initial sports medicine visit - Concerning presentation for acute and severe thigh pain mostly concentrated over distal femur.  Complicated by patient's past medical history of CLL - Patient had workup at ER on 04/06/2023 which included unremarkable femur x-ray and negative DVT ultrasound - With patient's past medical history, acute onset of pain, severity of pain, I feel it is necessary to obtain a femur MRI with contrast to evaluate for possible mass versus stress reaction versus stress fracture - Weightbearing as tolerated.  May continue to use cane for support - Start meloxicam 15 mg daily x2 weeks.  If still having pain after 2 weeks, complete 3rd-week of meloxicam. May use remaining meloxicam as needed once daily for pain control.  Do not to use additional NSAIDs while taking meloxicam.  May use Tylenol [phone] mg 2 to 3 times a day for breakthrough pain. - May continue to use Percocet as needed for severe breakthrough pain which was provided by ER - Handicap placard provided for patient for 6 months. - MR FEMUR RIGHT W CONTRAST; Future  Other orders - meloxicam (MOBIC) 15 MG tablet; Take 1 tablet (15 mg total) by mouth daily.    Pertinent previous records reviewed include femur x-ray 04/06/23, ER note 04/06/23, DVT ultrasound report 04/06/2023   Follow Up: 3 days after MRI to review results and discuss treatment plan     Subjective:   I, Paula [name], am serving as a Neurosurgeon for Doctor Richardean Sale  Chief Complaint: right thigh pain   HPI:   04/07/23 Patient is a 72 year old female complaining of right thigh pain. Patient states  right leg pain located from the right mid thigh to just proximal to the knee that started at 7 PM last night. Pain is constant but worse with walking. No  related injuries. Most of the pain is anterior in the leg. She has been taking Tylenol and Excedrin according to package instructions for pain but she only gets some relief for about an hour. No blood clots,  she has had constant pain since Friday, hx of eye surgery 3 weeks go, oxycodones only work for about 2-3 hours but the pain comes back when she moves, she is using lidocaine patches she has been icing her thigh , she gets relief when she applies pressure   Relevant Historical Information: CLL, osteopenia, Waldenstorms macroglobulinemia  Additional pertinent review of systems negative.   Current Outpatient Medications:    fluorouracil (EFUDEX) 5 % cream, Apply topically., Disp: , Rfl:    lidocaine (LIDODERM) 5 %, Place 1 patch onto the skin daily. Remove & Discard patch within 12 hours or as directed by MD, Disp: 14 patch, Rfl: 0   meloxicam (MOBIC) 15 MG tablet, Take 1 tablet (15 mg total) by mouth daily., Disp: 30 tablet, Rfl: 0   oxyCODONE-acetaminophen (PERCOCET) 5-325 MG tablet, Take 1 every 6 hours as needed for pain not relieved by Tylenol alone, Disp: 20 tablet, Rfl: 0   Objective:     Vitals:   04/07/23 1449  BP: (!) 148/78  Pulse: 82  SpO2: 99%  Weight: 154 lb (69.9 kg)  Height: 5\' 5"  (1.651 m)      Body mass index is 25.63 kg/m.    Physical Exam:    General: awake, alert, and oriented no  acute distress, nontoxic Skin: no suspicious lesions or rashes Neuro:sensation intact distally with no deficits, normal muscle tone, no atrophy, strength 5/5 in all tested lower ext groups Psych: normal mood and affect, speech clear   Right hip/femur: No deformity, swelling or wasting Hip ROM Flexion 90, ext 30, IR 45, ER 45 TTP significantly with light touch to distal third femur, primarily from anterior positioning, though pain also present from medial, lateral, and posterior palpation NTTP over the hip flexors, greater trochanter, gluteal musculature, si joint, lumbar  spine Negative log roll with FROM Negative FABER Negative FADIR Negative Piriformis test Negative trendelenberg Gait antalgic, favoring left leg Positive lever test on right femur Straight leg raise negative bilaterally  Electronically signed by:  Paula [name] D.[name] 4:38 PM 04/07/23

## 2023-04-07 NOTE — Patient Instructions (Addendum)
Good to see you Right femur MRI with urgent  - Start meloxicam 15 mg daily x2 weeks.  If still having pain after 2 weeks, complete 3rd-week of meloxicam. May use remaining meloxicam as needed once daily for pain control.  Do not to use additional NSAIDs while taking meloxicam.   May use Tylenol [phone] mg 2 to 3 times a day for breakthrough pain. Percocet as needed for breakthrough pain  Follow up 3 days after MRI

## 2023-04-08 NOTE — Addendum Note (Signed)
Addended by: Debbe Odea R on: 04/08/2023 12:50 PM   Modules accepted: Orders

## 2023-04-08 NOTE — Telephone Encounter (Signed)
Patient called stating that this point she would like to continue with having it done in [place]. She said that she contacted them and was told something on the order needed to be changed?  Please advise.

## 2023-04-08 NOTE — Addendum Note (Signed)
Addended by: Debbe Odea R on: 04/08/2023 12:53 PM   Modules accepted: Orders

## 2023-04-09 LAB — PATHOLOGIST SMEAR REVIEW

## 2023-04-10 ENCOUNTER — Ambulatory Visit (HOSPITAL_COMMUNITY)
Admission: RE | Admit: 2023-04-10 | Discharge: 2023-04-10 | Disposition: A | Discharge status: Home / Self Care | Payer: Medicare Other | Source: Ambulatory Visit | Attending: Sports Medicine | Admitting: Sports Medicine

## 2023-04-10 ENCOUNTER — Ambulatory Visit (HOSPITAL_COMMUNITY): Admission: RE | Admit: 2023-04-10 | Payer: Medicare Other | Source: Ambulatory Visit

## 2023-04-10 DIAGNOSIS — C9110 Chronic lymphocytic leukemia of B-cell type not having achieved remission: Secondary | ICD-10-CM | POA: Insufficient documentation

## 2023-04-10 DIAGNOSIS — M898X5 Other specified disorders of bone, thigh: Secondary | ICD-10-CM | POA: Diagnosis present

## 2023-04-10 MED ORDER — GADOBUTROL 1 MMOL/ML IV SOLN
7.0000 mL | Freq: Once | INTRAVENOUS | Status: AC | PRN
  Administered 2023-04-10: 7 mL via INTRAVENOUS

## 2023-04-14 ENCOUNTER — Ambulatory Visit (INDEPENDENT_AMBULATORY_CARE_PROVIDER_SITE_OTHER): Payer: Medicare Other | Admitting: Sports Medicine

## 2023-04-14 ENCOUNTER — Ambulatory Visit
Admission: RE | Admit: 2023-04-14 | Discharge: 2023-04-14 | Disposition: A | Discharge status: Home / Self Care | Payer: Medicare Other | Source: Ambulatory Visit | Attending: Internal Medicine | Admitting: Internal Medicine

## 2023-04-14 ENCOUNTER — Ambulatory Visit: Admission: RE | Admit: 2023-04-14 | Payer: Medicare Other | Source: Ambulatory Visit

## 2023-04-14 VITALS — HR 76 | Ht 65.0 in | Wt 154.0 lb

## 2023-04-14 DIAGNOSIS — N6310 Unspecified lump in the right breast, unspecified quadrant: Secondary | ICD-10-CM

## 2023-04-14 DIAGNOSIS — M898X5 Other specified disorders of bone, thigh: Secondary | ICD-10-CM | POA: Diagnosis not present

## 2023-04-14 DIAGNOSIS — C9110 Chronic lymphocytic leukemia of B-cell type not having achieved remission: Secondary | ICD-10-CM

## 2023-04-14 NOTE — Patient Instructions (Addendum)
Good to see you Knee HEP  Can discontinue meloxicam  As needed follow up

## 2023-04-14 NOTE — Progress Notes (Signed)
    Paula [name] D.[facility] [address] [place] [address] Phone: [phone]   Assessment and Plan:     1. Pain in right femur 2. CLL (chronic lymphocytic leukemia)  -Acute, complicated, improving, subsequent visit - Improvement in pain in right femur with PMH CLL - Reviewed patient's MRI which showed increased T2 signal consistent with CLL, however no signs of stress reaction versus stress fracture versus overt fracture - Patient was transition from meloxicam to prednisone Dosepak by plastic surgery due to occasional nosebleed with meloxicam, and continued swelling and bilateral eyes after procedure.  Several days of meloxicam followed by prednisone Dosepak seems to have significantly decreased inflammation in patient's right thigh and decreased pain.  May fully discontinue meloxicam at this time. - Recommend starting HEP for hamstrings and quadriceps for overall restrengthening and conditioning  Pertinent previous records reviewed include right femur MRI 04/10/23   Follow Up: As needed   Subjective:   I, Paula [name], am serving as a Neurosurgeon for Doctor Richardean Sale   Chief Complaint: right thigh pain    HPI:    04/07/23 Patient is a 72 year old female complaining of right thigh pain. Patient states  right leg pain located from the right mid thigh to just proximal to the knee that started at 7 PM last night. Pain is constant but worse with walking. No related injuries. Most of the pain is anterior in the leg. She has been taking Tylenol and Excedrin according to package instructions for pain but she only gets some relief for about an hour. No blood clots,  she has had constant pain since Friday, hx of eye surgery 3 weeks go, oxycodones only work for about 2-3 hours but the pain comes back when she moves, she is using lidocaine patches she has been icing her thigh , she gets relief when she applies pressure   04/14/2023 Patient states  that she is feeling much better     Relevant Historical Information: CLL, osteopenia, Waldenstorms macroglobulinemia Additional pertinent review of systems negative.  No current outpatient medications on file.   Objective:     Vitals:   04/14/23 0859  Pulse: 76  SpO2: 100%  Weight: 154 lb (69.9 kg)  Height: 5\' 5"  (1.651 m)      Body mass index is 25.63 kg/m.    Physical Exam:    General: awake, alert, and oriented no acute distress, nontoxic Skin: no suspicious lesions or rashes Neuro:sensation intact distally with no deficits, normal muscle tone, no atrophy, strength 5/5 in all tested lower ext groups Psych: normal mood and affect, speech clear   Right hip/femur: No deformity, swelling or wasting Hip ROM Flexion 90, ext 30, IR 45, ER 45  NTTP over the femur, quadriceps, hamstring, hip flexors, greater trochanter, gluteal musculature, si joint, lumbar spine Negative lever test Negative log roll with FROM Negative FABER Negative FADIR Negative Piriformis test Negative trendelenberg Gait normal Straight leg raise negative bilaterally    Electronically signed by:  Paula [name] D.[facility] 9:18 AM 04/14/23

## 2023-07-11 ENCOUNTER — Encounter: Payer: Self-pay | Admitting: Family Medicine

## 2023-07-11 ENCOUNTER — Other Ambulatory Visit: Payer: Self-pay

## 2023-07-11 ENCOUNTER — Ambulatory Visit (INDEPENDENT_AMBULATORY_CARE_PROVIDER_SITE_OTHER): Payer: Medicare Other | Admitting: Family Medicine

## 2023-07-11 VITALS — BP 136/84 | HR 72 | Ht 65.0 in | Wt 148.2 lb

## 2023-07-11 DIAGNOSIS — M25571 Pain in right ankle and joints of right foot: Secondary | ICD-10-CM

## 2023-07-11 NOTE — Progress Notes (Signed)
I, Stevenson Clinch, CMA acting as a scribe for Clementeen Graham, MD.  Paula [name] is a 72 y.o. female who presents to [facility] at [facility] today for R ankle pain. Pt's last visit for her R ankle was on 12/17/22 w/ Dr. Katrinka Blazing.   Today, patient reports continued pain related to arthritis. Has been using support brace. Had to stop walking at much d/t sx. Had steroid injection in the past with Dr. Katrinka Blazing, got minimal relief with this. Has upcoming cruise planned, leaving on the 17 th. Mechanical sx present. Swelling present. Pain primarily at medial aspect. Has been using orthotics which has been somewhat helpful.  She leaves Tuesday to go on a cruise to [place].  Pertinent review of systems: No fevers or chills  Relevant historical information: CLL.  History of left tibia fracture with not ideal ankle alignment causing degenerative changes left ankle.   Exam:  BP 136/84   Pulse 72   Ht 5\' 5"  (1.651 m)   Wt 148 lb 3.2 oz (67.2 kg)   SpO2 100%   BMI 24.66 kg/m  General: Well Developed, well nourished, and in no acute distress.   MSK: Right ankle: Ankle pronation present some swelling present medial ankle.  Tender palpation ankle in the talonavicular joint area.    Lab and Radiology Results  Procedure: Real-time Ultrasound Guided Injection of right ankle talonavicular joint Device: Philips Affiniti 50G/GE Logiq Images permanently stored and available for review in PACS Verbal informed consent obtained.  Discussed risks and benefits of procedure. Warned about infection, bleeding, hyperglycemia damage to structures among others. Patient expresses understanding and agreement Time-out conducted.   Noted no overlying erythema, induration, or other signs of local infection.   Skin prepped in a sterile fashion.   Local anesthesia: Topical Ethyl chloride.   With sterile technique and under real time ultrasound guidance: 40 mg of Kenalog and 1 mL of Marcaine injected into  talonavicular joint. Fluid seen entering the joint.   Completed without difficulty   Pain immediately resolved suggesting accurate placement of the medication.   Advised to call if fevers/chills, erythema, induration, drainage, or persistent bleeding.   Images permanently stored and available for review in the ultrasound unit.  Impression: Technically successful ultrasound guided injection.   EXAM: MRI OF THE RIGHT ANKLE WITHOUT CONTRAST   MRI OF LOWER RIGHT EXTREMITY WITHOUT CONTRAST   TECHNIQUE: Multiplanar, multisequence MR imaging of the right lower leg was performed. No intravenous contrast was administered.   Multiplanar, multisequence MR imaging of the ankle was performed. No intravenous contrast was administered.   COMPARISON:  Right ankle x-ray 11/28/2021   FINDINGS: TENDONS   Peroneal: Peroneal longus tendon intact. Mild tendinosis of the peroneus brevis with a short-segment longitudinal split tear.   Posteromedial: Posterior tibial tendon intact. Flexor hallucis longus tendon intact. Flexor digitorum longus tendon intact.   Anterior: Tibialis anterior tendon intact. Extensor hallucis longus tendon intact Extensor digitorum longus tendon intact.   Achilles:  Intact.   Plantar Fascia: Intact.   LIGAMENTS   Lateral: Anterior talofibular ligament intact. Calcaneofibular ligament intact. Posterior talofibular ligament intact. Anterior and posterior tibiofibular ligaments intact.   Medial: Deltoid ligament intact. Spring ligament intact.   CARTILAGE   Ankle Joint: No joint effusion. Partial-thickness cartilage loss of the tibiotalar joint.   Subtalar Joints/Sinus Tarsi: Normal subtalar joints. No subtalar joint effusion. Normal sinus tarsi.   Bones: Severe osteoarthritis of the talonavicular joint with subchondral reactive marrow edema and cystic changes.  Mild osteoarthritis of the calcaneocuboid joint. Old healed distal tibial diaphysis fracture  transfixed with 2 screws. Old healed distal fibular diaphysis fracture.   Abnormal marrow edema in the distal fibular diametaphysis. No surrounding periosteal reaction or bone destruction. No surrounding soft tissue edema.   Soft Tissue: No fluid collection or hematoma. Muscles are normal without edema or atrophy. Tarsal tunnel is normal.   IMPRESSION: 1. Mild tendinosis of the peroneus brevis with a short-segment longitudinal split tear. 2. Severe osteoarthritis of the talonavicular joint with subchondral reactive marrow edema and cystic changes. 3. Mild osteoarthritis of the calcaneocuboid joint. 4. Old healed distal tibial diaphysis fracture transfixed with 2 screws. Old healed distal fibular diaphysis fracture. Abnormal marrow edema in the distal fibular diametaphysis. No surrounding periosteal reaction or bone destruction. Overall appearance is concerning for stress reaction versus less likely infection or malignancy. If the patient's symptoms fail to resolve, recommend an MRI of the right lower leg in 3-6 months.     Electronically Signed   By: Elige Ko M.D.   On: 02/25/2022 06:16 I, Clementeen Graham, personally (independently) visualized and performed the interpretation of the images attached in this note.      Assessment and Plan: 72 y.o. female with right ankle pain.  Based on her MRI from about a year and half ago she has significant arthritis of the talonavicular joint.  Will target this is a location for injection today.  Plan for steroid injection.  Also recommend compressive ankle sleeve.  Recommend she take a cam walker boot with her on this trip.  If she has an exacerbation of pain a cam walker boot would be helpful.  She already has a prescription left over for oxycodone and meloxicam that she can use as well.  Recommend that she take those with her too.   PDMP reviewed during this encounter. Orders Placed This Encounter  Procedures   [place] LIMITED JOINT SPACE  STRUCTURES LOW RIGHT(NO LINKED CHARGES)    Order Specific Question:   Reason for Exam (SYMPTOM  OR DIAGNOSIS REQUIRED)    Answer:   rt ankle    Order Specific Question:   Preferred imaging location?    Answer:   [facility]   No orders of the defined types were placed in this encounter.    Discussed warning signs or symptoms. Please see discharge instructions. Patient expresses understanding.   The above documentation has been reviewed and is accurate and complete Clementeen Graham, M.D. Total encounter time 30 minutes including face-to-face time with the patient and, reviewing past medical record, and charting on the date of service.   Time-based billing excludes time to perform the injection.

## 2023-07-11 NOTE — Patient Instructions (Addendum)
Thank you for coming in today.   You received an injection today. Seek immediate medical attention if the joint becomes red, extremely painful, or is oozing fluid.   Please go to [facility] supply to get the cam walker we talked about today. You may also be able to get it from [employer].    Consider body helix full ankle sleeve.  I recommend you obtained a compression sleeve to help with your joint problems. There are many options on the market however I recommend obtaining a full ankle Body Helix compression sleeve.  You can find information (including how to appropriate measure yourself for sizing) can be found at [email or web address].  Many of these products are health savings account (HSA) eligible.   You can use the compression sleeve at any time throughout the day but is most important to use while being active as well as for 2 hours post-activity.   It is appropriate to ice following activity with the compression sleeve in place.

## 2023-08-20 ENCOUNTER — Telehealth: Payer: Self-pay | Admitting: Sports Medicine

## 2023-08-20 DIAGNOSIS — G8929 Other chronic pain: Secondary | ICD-10-CM

## 2023-08-20 DIAGNOSIS — M19071 Primary osteoarthritis, right ankle and foot: Secondary | ICD-10-CM

## 2023-08-20 NOTE — Telephone Encounter (Signed)
Patient called stating that she had a cortisone injection on 07/19/23. It helped greatly but has worn off at this point. She asked what next steps might would be?  She is going on a 56 day cruise in January and is trying to prepare for that and plan injections appropriately.   She also said that Dr Denyse Amass mentioned for her to try a light walking boot. When speaking to [facility] they recommended that she get a prescription from Dr Denyse Amass so that insurance would help cover it. Can this be written for her?  Patient has seen Dr Jean Rosenthal as well, but asked that since she Dr Denyse Amass last time and was very happy with the injection he gave, that I sent the message to him.  Please advise.

## 2023-08-21 MED ORDER — AMBULATORY NON FORMULARY MEDICATION
0 refills | Status: DC
Start: 2023-08-21 — End: 2024-07-12

## 2023-08-21 NOTE — Telephone Encounter (Signed)
Script printed for cam walker boot.   Forwarding to Dr. Denyse Amass to advise regarding recommendation for next injection.    Per visit note 07/11/23:  Assessment and Plan: 72 y.o. female with right ankle pain.  Based on her MRI from about a year and half ago she has significant arthritis of the talonavicular joint.  Will target this is a location for injection today.  Plan for steroid injection.  Also recommend compressive ankle sleeve.  Recommend she take a cam walker boot with her on this trip.  If she has an exacerbation of pain a cam walker boot would be helpful.  She already has a prescription left over for oxycodone and meloxicam that she can use as well.  Recommend that she take those with her too.

## 2023-08-22 NOTE — Telephone Encounter (Signed)
I recommend using the cam walker boot.  If you are struggling to get it through [facility] we can dispense 1 to you here.  Please let us know. We can do the injection about every 3 months.  I would recommend doing it again in October and then before you leave for your trip in January.

## 2023-08-22 NOTE — Telephone Encounter (Signed)
Script for cam walker boot given to [facility] yesterday. Please reach out to assist patient with scheduling injections per Dr. Denyse Amass -  "We can do the injection about every 3 months. I would recommend doing it again in October and then before you leave for your trip in January. "

## 2023-08-22 NOTE — Telephone Encounter (Signed)
Spoke to patient   Appointments have been scheduled.

## 2023-08-25 ENCOUNTER — Encounter: Payer: Self-pay | Admitting: Family Medicine

## 2023-10-13 ENCOUNTER — Ambulatory Visit: Payer: Medicare Other | Admitting: Family Medicine

## 2023-10-14 ENCOUNTER — Other Ambulatory Visit: Payer: Self-pay

## 2023-10-14 ENCOUNTER — Ambulatory Visit: Payer: Medicare Other | Admitting: Family Medicine

## 2023-10-14 ENCOUNTER — Encounter: Payer: Self-pay | Admitting: Family Medicine

## 2023-10-14 VITALS — BP 124/82 | HR 64 | Ht 65.0 in

## 2023-10-14 DIAGNOSIS — G8929 Other chronic pain: Secondary | ICD-10-CM | POA: Diagnosis not present

## 2023-10-14 DIAGNOSIS — M25571 Pain in right ankle and joints of right foot: Secondary | ICD-10-CM | POA: Diagnosis not present

## 2023-10-14 DIAGNOSIS — M19071 Primary osteoarthritis, right ankle and foot: Secondary | ICD-10-CM | POA: Diagnosis not present

## 2023-10-14 MED ORDER — AMBULATORY NON FORMULARY MEDICATION: 0 refills | Status: DC

## 2023-10-14 NOTE — Progress Notes (Unsigned)
I, Stevenson Clinch, CMA acting as a scribe for Clementeen Graham, MD.  Paula [name] is a 72 y.o. female who presents to [facility] at [facility] today for exacerbation of her R ankle pain. Pt was last seen by Dr. Denyse Amass 07/11/23 and was given a R talonavicular steroid injection. She was also advised to use a compressive ankle sleeve and CAM walker boot prn.   Today, pt reports that the ankle seems to be inverting more, wonders if new insert is needed. Ankle has been constantly sore. She goes to bed right after dinner to rest and elevated the ankle d/t increased pain. Pt brought in CAM walker boots, short and tall,   Dx imaging: 02/23/22 R ankle MRI  11/28/21 R ankle XR  Pertinent review of systems: No fevers or chills  Relevant historical information: CLL   Exam:  BP 124/82   Pulse 64   Ht 5\' 5"  (1.651 m)   SpO2 99%   BMI 24.66 kg/m  General: Well Developed, well nourished, and in no acute distress.   MSK: Right ankle significant pronation present.  Tender palpation medial ankle and midfoot. Normal foot and ankle motion. Intact strength.    Lab and Radiology Results  Procedure: Real-time Ultrasound Guided Injection of right ankle talonavicular joint medial approach Device: Philips Affiniti 50G/GE Logiq Images permanently stored and available for review in PACS Verbal informed consent obtained.  Discussed risks and benefits of procedure. Warned about infection, bleeding, hyperglycemia damage to structures among others. Patient expresses understanding and agreement Time-out conducted.   Noted no overlying erythema, induration, or other signs of local infection.   Skin prepped in a sterile fashion.   Local anesthesia: Topical Ethyl chloride.   With sterile technique and under real time ultrasound guidance: 40 mg of Kenalog and 1 mL of lidocaine injected into talonavicular joint. Fluid seen entering the joint capsule.   Completed without difficulty   Pain  immediately resolved suggesting accurate placement of the medication.   Advised to call if fevers/chills, erythema, induration, drainage, or persistent bleeding.   Images permanently stored and available for review in the ultrasound unit.  Impression: Technically successful ultrasound guided injection.   MRI report right ankle February 2023 IMPRESSION: 1. Mild tendinosis of the peroneus brevis with a short-segment longitudinal split tear. 2. Severe osteoarthritis of the talonavicular joint with subchondral reactive marrow edema and cystic changes. 3. Mild osteoarthritis of the calcaneocuboid joint. 4. Old healed distal tibial diaphysis fracture transfixed with 2 screws. Old healed distal fibular diaphysis fracture. Abnormal marrow edema in the distal fibular diametaphysis. No surrounding periosteal reaction or bone destruction. Overall appearance is concerning for stress reaction versus less likely infection or malignancy. If the patient's symptoms fail to resolve, recommend an MRI of the right lower leg in 3-6 months.     Electronically Signed   By: Elige Ko M.D.   On: 02/25/2022 06:16      Assessment and Plan: 72 y.o. female with right ankle pain due to DJD at the talonavicular joint.  Plan for repeat steroid injection.  She got about 6 weeks of pain relief out of the previous injection.  Will go ahead and do injection today.  She is traveling in early January 2025 I will probably going to do a joint injection just before that trip as well.  She could potentially benefit from more stabilization.  Plan for referral to [employer] for AFO type device to help stabilize the joint. Consider surgical referral  if needed.  PDMP not reviewed this encounter. Orders Placed This Encounter  Procedures   [place] LIMITED JOINT SPACE STRUCTURES LOW RIGHT(NO LINKED CHARGES)    Order Specific Question:   Reason for Exam (SYMPTOM  OR DIAGNOSIS REQUIRED)    Answer:   eval ankle pain     Order Specific Question:   Preferred imaging location?    Answer:   [facility] ordered this encounter  Medications   AMBULATORY NON FORMULARY MEDICATION    Sig: Ankle devise to provide medial support Dispense 1 Dx code: M19.071 Use as needed    Dispense:  1 Units    Refill:  0     Discussed warning signs or symptoms. Please see discharge instructions. Patient expresses understanding.   The above documentation has been reviewed and is accurate and complete Clementeen Graham, M.D.

## 2023-10-14 NOTE — Patient Instructions (Addendum)
Thank you for coming in today.  You received an injection today. Seek immediate medical attention if the joint becomes red, extremely painful, or is oozing fluid.  I'm placing an order for a device for you ankle to the [facility]  Dr. Victorino Dike, Dr. Lajoyce Corners, or Dr. Susa Simmonds

## 2023-12-29 ENCOUNTER — Other Ambulatory Visit: Payer: Self-pay

## 2023-12-29 ENCOUNTER — Ambulatory Visit (INDEPENDENT_AMBULATORY_CARE_PROVIDER_SITE_OTHER): Payer: Medicare Other | Admitting: Family Medicine

## 2023-12-29 ENCOUNTER — Encounter: Payer: Self-pay | Admitting: Family Medicine

## 2023-12-29 VITALS — BP 134/84 | HR 69 | Ht 65.0 in | Wt 160.0 lb

## 2023-12-29 DIAGNOSIS — M25571 Pain in right ankle and joints of right foot: Secondary | ICD-10-CM | POA: Diagnosis not present

## 2023-12-29 DIAGNOSIS — G8929 Other chronic pain: Secondary | ICD-10-CM | POA: Diagnosis not present

## 2023-12-29 NOTE — Patient Instructions (Signed)
Thank you for coming in today.   You received an injection today. Seek immediate medical attention if the joint becomes red, extremely painful, or is oozing fluid.  

## 2023-12-29 NOTE — Progress Notes (Signed)
   Rubin Payor, PhD, LAT, ATC acting as a scribe for Clementeen Graham, MD.  RIO ROOKE is a 72 y.o. female who presents to [facility] at [facility] today for cont'd R ankle pain. Pt was last seen by Dr. Denyse Amass on 10/14/23 and was given a R talonavicular steroid injection. She was also referred to [employer] for AFO type device to help stabilize.  Today, pt reports she hasn't been wearing her new AFO type device very much. She got new [employer] sneakers that she is finding very helpful. She is wanting a steroid injection prior to their 56 day cruise. Leaving Friday.  Dx imaging: 02/23/22 R ankle MRI             11/28/21 R ankle XR  Pertinent review of systems: No fevers or chills  Relevant historical information: Right ankle arthritis and CLL   Exam:  BP 134/84   Pulse 69   Ht 5\' 5"  (1.651 m)   Wt 160 lb (72.6 kg)   SpO2 98%   BMI 26.63 kg/m  General: Well Developed, well nourished, and in no acute distress.   MSK: Right ankle excessive pronation is present.  Tender palpation anterior medial ankle.    Lab and Radiology Results  Procedure: Real-time Ultrasound Guided Injection of right ankle joint medial approach Device: Philips Affiniti 50G/GE Logiq Images permanently stored and available for review in PACS Verbal informed consent obtained.  Discussed risks and benefits of procedure. Warned about infection, bleeding, hyperglycemia damage to structures among others. Patient expresses understanding and agreement Time-out conducted.   Noted no overlying erythema, induration, or other signs of local infection.   Skin prepped in a sterile fashion.   Local anesthesia: Topical Ethyl chloride.   With sterile technique and under real time ultrasound guidance: 40 mg of Kenalog and 2 mL of Marcaine injected into ankle joint. Fluid seen entering the joint capsule.   Completed without difficulty   Pain immediately resolved suggesting accurate placement of the  medication.   Advised to call if fevers/chills, erythema, induration, drainage, or persistent bleeding.   Images permanently stored and available for review in the ultrasound unit.  Impression: Technically successful ultrasound guided injection.        Assessment and Plan: 72 y.o. female with chronic right ankle pain due to DJD.  Plan for steroid injection today.  She had good benefit from previous injection.  She is leaving for a 56-day cruise halfway around the [place] later this week.  This should provide good pain relief.  We also talked about compression sleeves and cam walker boots.   PDMP not reviewed this encounter. Orders Placed This Encounter  Procedures   [place] LIMITED JOINT SPACE STRUCTURES LOW RIGHT(NO LINKED CHARGES)    Reason for Exam (SYMPTOM  OR DIAGNOSIS REQUIRED):   right ankle pain    Preferred imaging location?:   [facility]   No orders of the defined types were placed in this encounter.    Discussed warning signs or symptoms. Please see discharge instructions. Patient expresses understanding.   The above documentation has been reviewed and is accurate and complete Clementeen Graham, M.D.

## 2024-03-19 ENCOUNTER — Encounter: Payer: Self-pay | Admitting: Family Medicine

## 2024-03-19 ENCOUNTER — Ambulatory Visit (INDEPENDENT_AMBULATORY_CARE_PROVIDER_SITE_OTHER): Admitting: Family Medicine

## 2024-03-19 VITALS — BP 130/72 | HR 90 | Temp 97.8°F | Wt 163.0 lb

## 2024-03-19 DIAGNOSIS — H66001 Acute suppurative otitis media without spontaneous rupture of ear drum, right ear: Secondary | ICD-10-CM | POA: Diagnosis not present

## 2024-03-19 MED ORDER — AMOXICILLIN 875 MG PO TABS: 875.0000 mg | ORAL_TABLET | Freq: Two times a day (BID) | ORAL | 0 refills | Status: AC

## 2024-03-19 NOTE — Progress Notes (Signed)
   Acute Office Visit  Subjective:     Patient ID: Paula [name], female    DOB: 30-Oct-1951, 73 y.o.   MRN: [id number]  Chief Complaint  Patient presents with   Cough    Productive with brown-green sputum x12 days, tried Mucinex with some relief   Fatigue   Urinary Incontinence    Cough   Patient is in today for acute cough and mucus production. States that she was visiting her son and grandkids, states they were all coughing with something. States that she has some nasal congestion, sore throat, lose voice, and coughing. Reports she is on the chemo medication for her CLL and was worried about the persistence of her symptoms, no fever or chills. States that she stopped the prophylactic antibiotics in February. States she also stopped the doxycycline for her rosacea. No pain or pressure in her sinuses or ears. Has been taking sudafed DM for the past 4 days or so which helps some.  Review of Systems  Respiratory:  Positive for cough.   All other systems reviewed and are negative.       Objective:    BP 130/72   Pulse 90   Temp 97.8 F (36.6 C) (Oral)   Wt 163 lb (73.9 kg)   SpO2 99%   BMI 27.12 kg/m    Physical Exam Vitals reviewed.  Constitutional:      Appearance: Normal appearance. She is well-groomed and normal weight.  HENT:     Right Ear: Tympanic membrane is injected and bulging.     Left Ear: Tympanic membrane normal.     Mouth/Throat:     Mouth: Mucous membranes are moist.     Pharynx: No posterior oropharyngeal erythema.  Neck:     Thyroid: No thyromegaly.  Cardiovascular:     Rate and Rhythm: Normal rate and regular rhythm.     Heart sounds: S1 normal and S2 normal.  Pulmonary:     Effort: Pulmonary effort is normal.     Breath sounds: Normal breath sounds and air entry. No wheezing, rhonchi or rales.  Lymphadenopathy:     Cervical: No cervical adenopathy.  Neurological:     Mental Status: She is alert and oriented to person, place, and time.  Mental status is at baseline.     Gait: Gait is intact.  Psychiatric:        Mood and Affect: Mood and affect normal.        Speech: Speech normal.        Behavior: Behavior normal.     No results found for any visits on 03/19/24.      Assessment & Plan:   Problem List Items Addressed This Visit   None Visit Diagnoses       Non-recurrent acute suppurative otitis media of right ear without spontaneous rupture of tympanic membrane    -  Primary   Relevant Medications   amoxicillin (AMOXIL) 875 MG tablet     Ear infection noted on the right TM, will treat with amoxicillin course. Otherwise there is no wheezing or other signs of concern.   Meds ordered this encounter  Medications   amoxicillin (AMOXIL) 875 MG tablet    Sig: Take 1 tablet (875 mg total) by mouth 2 (two) times daily for 10 days.    Dispense:  20 tablet    Refill:  0    Return in about 1 year (around 03/19/2025).  Karie Georges, MD

## 2024-03-22 ENCOUNTER — Other Ambulatory Visit: Payer: Self-pay

## 2024-03-22 ENCOUNTER — Ambulatory Visit: Admitting: Family Medicine

## 2024-03-22 ENCOUNTER — Encounter: Payer: Self-pay | Admitting: Family Medicine

## 2024-03-22 VITALS — HR 86 | Resp 98 | Ht 65.0 in

## 2024-03-22 DIAGNOSIS — G8929 Other chronic pain: Secondary | ICD-10-CM

## 2024-03-22 DIAGNOSIS — M79675 Pain in left toe(s): Secondary | ICD-10-CM

## 2024-03-22 DIAGNOSIS — M25571 Pain in right ankle and joints of right foot: Secondary | ICD-10-CM | POA: Diagnosis not present

## 2024-03-22 NOTE — Progress Notes (Signed)
 I, Stevenson Clinch, CMA acting as a scribe for Clementeen Graham, MD.  Paula [name] is a 73 y.o. female who presents to [facility] at [facility] today for 72-month f/u R ankle pain. Pt was last seen by Dr. Denyse Amass on 11/3023 and was given a R medial ankle steroid injection.   Today, pt reports about 1 month of improvement of sx after last injection. Has changed shoes to [facility] which has been helpful for sx. Tried wearing a boot, this seemed to exacerbate an sx. Swelling present around the ankle. Also c/o pain at the left great toe.   Dx imaging: 02/23/22 R ankle MRI             11/28/21 R ankle XR  Pertinent review of systems: No fevers or chills  Relevant historical information: Severe ankle arthritis.  Patient has a 1 month long cruise to [place] happening in July. Chronic lymphocytic leukemia.  Exam:  Pulse 86   Resp (!) 98   Ht 5\' 5"  (1.651 m)   BMI 27.12 kg/m  General: Well Developed, well nourished, and in no acute distress.   MSK: Right ankle significant pronation.  Tender palpation medial joint line decreased ankle range of motion.  Intact strength.  Left foot and ankle normal. Tender palpation plantar first MTP.  Normal toe motion.  Intact strength.   Lab and Radiology Results  Procedure: Real-time Ultrasound Guided Injection of right ankle medial approach Device: Philips Affiniti 50G/GE Logiq Images permanently stored and available for review in PACS Verbal informed consent obtained.  Discussed risks and benefits of procedure. Warned about infection, bleeding, hyperglycemia damage to structures among others. Patient expresses understanding and agreement Time-out conducted.   Noted no overlying erythema, induration, or other signs of local infection.   Skin prepped in a sterile fashion.   Local anesthesia: Topical Ethyl chloride.   With sterile technique and under real time ultrasound guidance: 40 mg of Kenalog and 1 mL of Marcaine injected into ankle  joint. Fluid seen entering the capsule.   Completed without difficulty.  Patient had a little bit of bleeding after the injection which is easily controllable with pressure for about a minute. Pain immediately resolved suggesting accurate placement of the medication.   Advised to call if fevers/chills, erythema, induration, drainage, or persistent bleeding.   Images permanently stored and available for review in the ultrasound unit.  Impression: Technically successful ultrasound guided injection.         Assessment and Plan: 73 y.o. female with chronic right ankle pain due to significant DJD seen on prior MRI.  Plan for conventional steroid injection today.  She may be a good candidate for Zilretta injection in the ankle.  Unfortunately her insurance will not pay for Zilretta so she will have to pay out-of-pocket which should be around $900.  Something to consider for the future. We did talk about surgery.  She really does not want surgery but it may be worth her while to have a consultation with a foot and ankle surgeon.  Consider Dr. Victorino Dike or Dr. Susa Simmonds or Dr. Lajoyce Corners  Left great toe pain improving.  Either capsule Strain or Sesamoiditis.  Plan to Treat with off Loader Pad Modification of Orthotics.    PDMP not reviewed this encounter. Orders Placed This Encounter  Procedures   [place] LIMITED JOINT SPACE STRUCTURES LOW RIGHT(NO LINKED CHARGES)    Reason for Exam (SYMPTOM  OR DIAGNOSIS REQUIRED):   right ankle pain    Preferred  imaging location?:   [facility]   No orders of the defined types were placed in this encounter.    Discussed warning signs or symptoms. Please see discharge instructions. Patient expresses understanding.   The above documentation has been reviewed and is accurate and complete Clementeen Graham, M.D.

## 2024-03-22 NOTE — Patient Instructions (Signed)
 Thank you for coming in today.   You received an injection today. Seek immediate medical attention if the joint becomes red, extremely painful, or is oozing fluid.

## 2024-03-23 ENCOUNTER — Ambulatory Visit: Payer: Medicare Other | Admitting: Family Medicine

## 2024-03-24 ENCOUNTER — Ambulatory Visit: Payer: Self-pay | Admitting: Family Medicine

## 2024-03-24 DIAGNOSIS — R052 Subacute cough: Secondary | ICD-10-CM

## 2024-03-24 NOTE — Telephone Encounter (Signed)
 This RN made second attempt to triage patient. Received "this call cannot be completed as dialed" message. Routing for additional attempts.

## 2024-03-24 NOTE — Telephone Encounter (Signed)
 Third attempt made to return pt's call.   "This call cannot be completed as dialed" is the recording I get.  Per policy I'm forwarding this to [facility] [place].

## 2024-03-24 NOTE — Telephone Encounter (Signed)
 This RN made first attempt to triage patient. Received "this call cannot be completed as dialed" message. Routing for additional attempts.   Copied From CRM [phone]. Reason for Triage: Patient Is having a persistent cough since being seen on 3/21. She has taken all the antibiotics, wanting to know if she is able to get an inhaler to help out with getting better

## 2024-03-25 ENCOUNTER — Ambulatory Visit

## 2024-03-25 ENCOUNTER — Encounter: Payer: Self-pay | Admitting: *Deleted

## 2024-03-25 ENCOUNTER — Other Ambulatory Visit

## 2024-03-25 DIAGNOSIS — R052 Subacute cough: Secondary | ICD-10-CM | POA: Diagnosis not present

## 2024-03-25 NOTE — Telephone Encounter (Signed)
 Noted.

## 2024-03-25 NOTE — Telephone Encounter (Signed)
 It looks like she uses Mychart, I would send her a message over Mychart to let her know that the phone number is incorrect. I will order her a chest x-ray and she will need to schedule a lab appointment to get the chest film done here.

## 2024-03-31 ENCOUNTER — Ambulatory Visit (INDEPENDENT_AMBULATORY_CARE_PROVIDER_SITE_OTHER): Payer: Medicare Other

## 2024-03-31 ENCOUNTER — Telehealth: Payer: Self-pay | Admitting: *Deleted

## 2024-03-31 VITALS — BP 122/64 | HR 64 | Temp 97.8°F | Ht 65.0 in | Wt 162.7 lb

## 2024-03-31 DIAGNOSIS — Z0000 Encounter for general adult medical examination without abnormal findings: Secondary | ICD-10-CM | POA: Diagnosis not present

## 2024-03-31 NOTE — Patient Instructions (Addendum)
 Ms. Garlock , Thank you for taking time to come for your Medicare Wellness Visit. I appreciate your ongoing commitment to your health goals. Please review the following plan we discussed and let me know if I can assist you in the future.   Referrals/Orders/Follow-Ups/Clinician Recommendations:   This is a list of the screening recommended for you and due dates:  Health Maintenance  Topic Date Due   COVID-19 Vaccine (4 - 2024-25 season) 08/31/2023   DTaP/Tdap/Td vaccine (2 - Td or Tdap) 03/14/2024   Flu Shot  07/30/2024   Medicare Annual Wellness Visit  03/31/2025   Mammogram  04/13/2025   Colon Cancer Screening  09/30/2027   Pneumonia Vaccine  Completed   DEXA scan (bone density measurement)  Completed   Hepatitis C Screening  Completed   Zoster (Shingles) Vaccine  Completed   HPV Vaccine  Aged Out    Advanced directives: (Provided) Advance directive discussed with you today. I have provided a copy for you to complete at home and have notarized. Once this is complete, please bring a copy in to our office so we can scan it into your chart.   Next Medicare Annual Wellness Visit scheduled for next year: Yes

## 2024-03-31 NOTE — Progress Notes (Signed)
 Subjective:   Paula [name] is a 73 y.o. who presents for a Medicare Wellness preventive visit.  Visit Complete: In person    Persons Participating in Visit: Patient.  AWV Questionnaire: No: Patient Medicare AWV questionnaire was not completed prior to this visit.  Cardiac Risk Factors include: advanced age (>48men, >12 women)     Objective:    Today's Vitals   03/31/24 0921  BP: 122/64  Pulse: 64  Temp: 97.8 F (36.6 C)  TempSrc: Oral  SpO2: 98%  Weight: 162 lb 11.2 oz (73.8 kg)  Height: 5\' 5"  (1.651 m)   Body mass index is 27.07 kg/m.     03/31/2024    9:45 AM 04/06/2023    4:21 AM 09/29/2017   10:35 AM 09/24/2017    4:02 PM 01/28/2017    5:13 PM 05/02/2016   11:06 AM  Advanced Directives  Does Patient Have a Medical Advance Directive? No No No No No No  Would patient like information on creating a medical advance directive? No - Patient declined    No - Patient declined No - patient declined information    Current Medications (verified) Outpatient Encounter Medications as of 03/31/2024  Medication Sig   AMBULATORY NON FORMULARY MEDICATION Cam walker boot Dispense 1 Dx code: M19.071, M25.571, G89.29 Use as needed   AMBULATORY NON FORMULARY MEDICATION Ankle devise to provide medial support Dispense 1 Dx code: M19.071 Use as needed   CALQUENCE 100 MG tablet Take by mouth.   valACYclovir (VALTREX) 1000 MG tablet Take by mouth.   No facility-administered encounter medications on file as of 03/31/2024.    Allergies (verified) Ciprofloxacin   History: Past Medical History:  Diagnosis Date   CARPAL TUNNEL SYNDROME, MILD 12/05/2008   Chronic cough - seeing pulmonologist in [place], [facility] 03/14/2014   HSV 03/01/2010   Leg skin lesion, left 01/30/2015   precancerous cells per patient   OSTEOPENIA 12/05/2008   PARESTHESIA 09/01/2009   Waldenstroms macroglobulinemia - followed by Dr. Alden Hipp, [place], [facility] 03/15/2014   Past Surgical  History:  Procedure Laterality Date   CHOLECYSTECTOMY  02/2017   LEG SKIN LESION  BIOPSY / EXCISION Left 01/30/2015   Dr Clydie Braun Gould-precancerous cells per patient   ORIF TIBIA & FIBULA FRACTURES Right 1988   TONSILLECTOMY AND ADENOIDECTOMY  1958   Family History  Problem Relation Age of Onset   Hypertension Mother    Hypertension Brother    Colon cancer Father 53   Hypertension Son    Cancer Neg Hx        lung ca - mother's side   Arthritis Neg Hx        osteo - mother's side   Breast cancer Neg Hx    Social History   Socioeconomic History   Marital status: Married    Spouse name: Not on file   Number of children: Not on file   Years of education: Not on file   Highest education level: Not on file  Occupational History   Not on file  Tobacco Use   Smoking status: Never   Smokeless tobacco: Never  Substance and Sexual Activity   Alcohol use: Yes    Alcohol/week: 6.0 standard drinks of alcohol    Types: 6 Glasses of wine per week   Drug use: No   Sexual activity: Not on file  Other Topics Concern   Not on file  Social History Narrative   Work or School: IT at [employer]  Home Situation:      Spiritual Beliefs:      Lifestyle:            Social Drivers of Corporate investment banker Strain: Low Risk  (03/31/2024)   Overall Financial Resource Strain (CARDIA)    Difficulty of Paying Living Expenses: Not hard at all  Food Insecurity: No Food Insecurity (03/31/2024)   Hunger Vital Sign    Worried About Running Out of Food in the Last Year: Never true    Ran Out of Food in the Last Year: Never true  Transportation Needs: No Transportation Needs (03/31/2024)   PRAPARE - Administrator, Civil Service (Medical): No    Lack of Transportation (Non-Medical): No  Physical Activity: Inactive (03/31/2024)   Exercise Vital Sign    Days of Exercise per Week: 0 days    Minutes of Exercise per Session: 0 min  Stress: No Stress Concern Present (03/31/2024)   [employer] of Occupational Health - Occupational Stress Questionnaire    Feeling of Stress : Not at all  Social Connections: Moderately Isolated (03/31/2024)   Social Connection and Isolation Panel [NHANES]    Frequency of Communication with Friends and Family: More than three times a week    Frequency of Social Gatherings with Friends and Family: More than three times a week    Attends Religious Services: Never    Database administrator or Organizations: No    Attends Engineer, structural: Never    Marital Status: Married    Tobacco Counseling Counseling given: Not Answered    Clinical Intake:  Pre-visit preparation completed: Yes  Pain : No/denies pain     BMI - recorded: 27.07 Nutritional Status: BMI 25 -29 Overweight Nutritional Risks: None Diabetes: No  Lab Results  Component Value Date   HGBA1C 5.7 08/13/2018   HGBA1C 5.3 04/20/2014     How often do you need to have someone help you when you read instructions, pamphlets, or other written materials from your doctor or pharmacy?: 1 - Never  Interpreter Needed?: No  Information entered by :: Theresa Mulligan LPN   Activities of Daily Living     03/31/2024    9:42 AM  In your present state of health, do you have any difficulty performing the following activities:  Hearing? 0  Vision? 0  Difficulty concentrating or making decisions? 0  Walking or climbing stairs? 0  Dressing or bathing? 0  Doing errands, shopping? 0  Preparing Food and eating ? N  Using the Toilet? N  In the past six months, have you accidently leaked urine? Y  Comment Followed by medical attention. Wears Pads.  Do you have problems with loss of bowel control? Y  Comment Wears Pads. Followed by medical attention  Managing your Medications? N  Managing your Finances? N  Housekeeping or managing your Housekeeping? N    Patient Care Team: Karie Georges, MD as PCP - General (Family Medicine) Elmon Else, MD as Consulting  Physician (Dermatology) Eliseo Gum, MD as Referring Physician (Internal Medicine)  Indicate any recent Medical Services you may have received from other than [facility] providers in the past year (date may be approximate).     Assessment:   This is a routine wellness examination for [place].  Hearing/Vision screen Hearing Screening - Comments:: Denies hearing difficulties   Vision Screening - Comments:: Wears rx glasses - up to date with routine eye exams with  [facility]   Goals  Addressed               This Visit's Progress     Travel (pt-stated)         Depression Screen      03/31/2024    9:41 AM 03/19/2024   11:28 AM 10/18/2022    9:38 AM 08/11/2018   11:04 AM 08/10/2015    8:23 AM  PHQ 2/9 Scores  PHQ - 2 Score 0 0 1 0 0  PHQ- 9 Score   2      Fall Risk     03/31/2024    9:45 AM 03/19/2024   11:27 AM 10/18/2022    9:39 AM 08/10/2015    8:23 AM  Fall Risk   Falls in the past year? 0 0 0 No  Number falls in past yr: 0 0 0   Injury with Fall? 0 0 0   Risk for fall due to : No Fall Risks No Fall Risks No Fall Risks   Follow up Falls prevention discussed;Falls evaluation completed Falls evaluation completed Falls evaluation completed     MEDICARE RISK AT HOME:  Medicare Risk at Home Any stairs in or around the home?: Yes If so, are there any without handrails?: No Home free of loose throw rugs in walkways, pet beds, electrical cords, etc?: Yes Adequate lighting in your home to reduce risk of falls?: Yes Life alert?: No Use of a cane, walker or w/c?: No Grab bars in the bathroom?: No Shower chair or bench in shower?: No Elevated toilet seat or a handicapped toilet?: No  TIMED UP AND GO:  Was the test performed?  Yes  Length of time to ambulate 10 feet: 10 sec Gait steady and fast without use of assistive device  Cognitive Function: 6CIT completed        03/31/2024    9:49 AM  6CIT Screen  What Year? 0 points  What month? 0 points  What time? 0  points  Count back from 20 0 points  Months in reverse 0 points  Repeat phrase 0 points  Total Score 0 points    Immunizations Immunization History  Administered Date(s) Administered   Fluad Quad(high Dose 65+) 10/03/2022   Hepatitis B 03/01/2010   Hepatitis B, PED/ADOLESCENT 03/01/2010   Influenza, Seasonal, Injecte, Preservative Fre 10/13/2017, 08/30/2018   Influenza,inj,Quad PF,6+ Mos 10/25/2013   Influenza-Unspecified 08/30/2018   PFIZER(Purple Top)SARS-COV-2 Vaccination 01/20/2020, 02/09/2020, 10/19/2020   Pneumococcal Conjugate-13 08/10/2015   Pneumococcal Polysaccharide-23 08/11/2018   Pneumococcal-Unspecified 08/11/2018   Tdap 03/14/2014   Zoster Recombinant(Shingrix) 09/03/2018, 11/03/2018    Screening Tests Health Maintenance  Topic Date Due   COVID-19 Vaccine (4 - 2024-25 season) 08/31/2023   DTaP/Tdap/Td (2 - Td or Tdap) 03/14/2024   INFLUENZA VACCINE  07/30/2024   Medicare Annual Wellness (AWV)  03/31/2025   MAMMOGRAM  04/13/2025   Colonoscopy  09/30/2027   Pneumonia Vaccine 59+ Years old  Completed   DEXA SCAN  Completed   Hepatitis C Screening  Completed   Zoster Vaccines- Shingrix  Completed   HPV VACCINES  Aged Out    Health Maintenance  Health Maintenance Due  Topic Date Due   COVID-19 Vaccine (4 - 2024-25 season) 08/31/2023   DTaP/Tdap/Td (2 - Td or Tdap) 03/14/2024   Health Maintenance Items Addressed:    Additional Screening:  Vision Screening: Recommended annual ophthalmology exams for early detection of glaucoma and other disorders of the eye.  Dental Screening: Recommended annual dental exams for proper oral  hygiene  Community Resource Referral / Chronic Care Management: CRR required this visit?  No   CCM required this visit?  No     Plan:     I have personally reviewed and noted the following in the patient's chart:   Medical and social history Use of alcohol, tobacco or illicit drugs  Current medications and supplements  including opioid prescriptions. Patient is not currently taking opioid prescriptions. Functional ability and status Nutritional status Physical activity Advanced directives List of other physicians Hospitalizations, surgeries, and ER visits in previous 12 months Vitals Screenings to include cognitive, depression, and falls Referrals and appointments  In addition, I have reviewed and discussed with patient certain preventive protocols, quality metrics, and best practice recommendations. A written personalized care plan for preventive services as well as general preventive health recommendations were provided to patient.     Tillie Rung, LPN   07/31/9561   After Visit Summary: (MyChart) Due to this being a telephonic visit, the after visit summary with patients personalized plan was offered to patient via MyChart   Notes: Nothing significant to report at this time.

## 2024-03-31 NOTE — Telephone Encounter (Signed)
 Patient came to my desk after the AWV questioning results she had a few weeks ago.  Patient was advised the chest x-ray was performed last week on 3/27,  the radiologists are back logged and she will be contacted once the results are received.  Patient stated to cancel the x-ray and was advised we are unable to do so as she has already had this done.  Stated she wished she had known this as she came in with congestion, asked if she could be told this in the future if an x-ray is ordered.  Patient wanted to know when she would get the results and was again advised a number of times the radiologists are back logged and we are not aware when results will be in.  Patient was advised she should seek treatment in the ER if she has an emergency.

## 2024-04-04 ENCOUNTER — Encounter: Payer: Self-pay | Admitting: Family Medicine

## 2024-04-13 ENCOUNTER — Other Ambulatory Visit: Payer: Self-pay | Admitting: Internal Medicine

## 2024-04-13 DIAGNOSIS — Z8572 Personal history of non-Hodgkin lymphomas: Secondary | ICD-10-CM

## 2024-04-15 ENCOUNTER — Other Ambulatory Visit: Payer: Self-pay | Admitting: Internal Medicine

## 2024-04-15 DIAGNOSIS — N6341 Unspecified lump in right breast, subareolar: Secondary | ICD-10-CM

## 2024-04-15 DIAGNOSIS — M79629 Pain in unspecified upper arm: Secondary | ICD-10-CM

## 2024-05-10 ENCOUNTER — Ambulatory Visit (INDEPENDENT_AMBULATORY_CARE_PROVIDER_SITE_OTHER): Admitting: Family Medicine

## 2024-05-10 ENCOUNTER — Ambulatory Visit: Payer: Self-pay

## 2024-05-10 ENCOUNTER — Encounter: Payer: Self-pay | Admitting: Family Medicine

## 2024-05-10 ENCOUNTER — Ambulatory Visit (HOSPITAL_COMMUNITY)
Admission: RE | Admit: 2024-05-10 | Discharge: 2024-05-10 | Disposition: A | Discharge status: Home / Self Care | Source: Ambulatory Visit | Attending: Family Medicine | Admitting: Family Medicine

## 2024-05-10 ENCOUNTER — Telehealth: Payer: Self-pay

## 2024-05-10 VITALS — BP 128/86 | HR 72 | Ht 65.0 in | Wt 162.0 lb

## 2024-05-10 DIAGNOSIS — G8929 Other chronic pain: Secondary | ICD-10-CM

## 2024-05-10 DIAGNOSIS — R42 Dizziness and giddiness: Secondary | ICD-10-CM

## 2024-05-10 DIAGNOSIS — C9110 Chronic lymphocytic leukemia of B-cell type not having achieved remission: Secondary | ICD-10-CM

## 2024-05-10 DIAGNOSIS — M79604 Pain in right leg: Secondary | ICD-10-CM

## 2024-05-10 MED ORDER — MELOXICAM 15 MG PO TABS: ORAL_TABLET | ORAL | 3 refills | Status: DC

## 2024-05-10 NOTE — Progress Notes (Signed)
   Joanna Muck, PhD, LAT, ATC acting as a scribe for Garlan Juniper, MD.  Paula [name] is a 73 y.o. female who presents to [facility] today for R LE pain. Pt was previously seen by Dr. Alease Hunter on 03/22/24 for R ankle pain.   Today, pt called the office c/o R LE pain, posterior R knee, and into the R calf, and distal posterior thigh, ongoing for about 3-wks, worsening. She also notes occassional "spinning" and slight nausea x 2-wks, lasting about 20secs. She has concerns about a possible blood clot, esp given that she is on Calquence.   Dx imaging: 11/28/21 R knee XR  Pertinent review of systems: No fevers or chills  Relevant historical information: Phonic lymphocytic leukemia.   Exam:  BP 128/86   Pulse 72   Ht 5\' 5"  (1.651 m)   Wt 162 lb (73.5 kg)   SpO2 100%   BMI 26.96 kg/m  General: Well Developed, well nourished, and in no acute distress.   MSK: Right leg normal-appearing Posterior hamstring posterior knee and posterior calf is nontender to palpation.  Normal foot and ankle motion. Knee motion is intact. Intact strength. Stable ligamentous exam knee. Calf squeeze test negative.  Neuro: Alert and oriented normal coordination.    Assessment and Plan: 73 y.o. female with right leg pain.  Blood clot is unlikely based on her physical exam.  She is at increased risk for forming blood clots with her chronic lymphocytic leukemia.  Will go ahead and arrange for a vascular ultrasound to check for DVT.  Differential diagnosis includes strained hamstring and calf, lumbar radiculopathy, referred pain from knee.  Will try some limited meloxicam  which has worked in the past pretty well for her.  Obviously will need to reconsider this medication if she does have a blood clot.  Additionally will refer to physical therapy which I think could be quite helpful.   Additionally she has symptoms consistent with benign paroxysmal positional vertigo.  She has had  this in the past and has benefited from vestibular physical therapy.  Plan to refer to vestibular PT.  PDMP not reviewed this encounter. Orders Placed This Encounter  Procedures   US  LIMITED JOINT SPACE STRUCTURES LOW RIGHT(NO LINKED CHARGES)    Reason for Exam (SYMPTOM  OR DIAGNOSIS REQUIRED):   right LE pain    Preferred imaging location?:   [facility] referral to Physical Therapy    Referral Priority:   Routine    Referral Type:   Physical Medicine    Referral Reason:   Specialty Services Required    Requested Specialty:   Physical Therapy    Number of Visits Requested:   1   Meds ordered this encounter  Medications   meloxicam  (MOBIC ) 15 MG tablet    Sig: One tab PO qAM with breakfast for 2 weeks, then daily prn pain.    Dispense:  30 tablet    Refill:  3     Discussed warning signs or symptoms. Please see discharge instructions. Patient expresses understanding.   The above documentation has been reviewed and is accurate and complete Garlan Juniper, M.D.

## 2024-05-10 NOTE — Telephone Encounter (Signed)
 Pt called c/o R LE pain, posterior R knee, and into the R calf. She also notes occassional "spinning" and slight nausea. She has concerns about a possible blood clot, esp given that she is on Calquence. Pt scheduled for a same day visit.

## 2024-05-10 NOTE — Patient Instructions (Addendum)
 Thank you for coming in today.   You have a Vascular Ultrasound scheduled for:  [facility] [address], [address] [place], [place] [address]   I've referred you to Physical Therapy.  Let us  know if you don't hear from them in one week.   Keep me updated

## 2024-05-11 ENCOUNTER — Ambulatory Visit: Payer: Self-pay | Admitting: Family Medicine

## 2024-05-11 ENCOUNTER — Telehealth: Payer: Self-pay

## 2024-05-11 NOTE — Telephone Encounter (Signed)
 There are alternatives but what side effects are you was concerned about?  Most of the ibuprofen related medications including meloxicam , ibuprofen, naproxen  will include some heart risk, some kidney risk, and some GI risk.  I can tailor the medication to the concern.  Please ask Paula [name].

## 2024-05-11 NOTE — Therapy (Incomplete)
 OUTPATIENT PHYSICAL THERAPY VESTIBULAR EVALUATION     Patient Name: Paula [name] MRN: [id number] DOB:07/11/1951, 73 y.o., female Today's Date: 05/11/2024  END OF SESSION:   Past Medical History:  Diagnosis Date   CARPAL TUNNEL SYNDROME, MILD 12/05/2008   Chronic cough - seeing pulmonologist in [place], [facility] 03/14/2014   HSV 03/01/2010   Leg skin lesion, left 01/30/2015   precancerous cells per patient   OSTEOPENIA 12/05/2008   PARESTHESIA 09/01/2009   Waldenstroms macroglobulinemia - followed by Dr. Keturah Peer, [place], [facility] 03/15/2014   Past Surgical History:  Procedure Laterality Date   CHOLECYSTECTOMY  02/2017   LEG SKIN LESION  BIOPSY / EXCISION Left 01/30/2015   Dr Mariah Shines Gould-precancerous cells per patient   ORIF TIBIA & FIBULA FRACTURES Right 1988   TONSILLECTOMY AND ADENOIDECTOMY  1958   Patient Active Problem List   Diagnosis Date Noted   Synovial cyst of wrist, right 10/18/2022   Arthritis of right ankle 11/28/2021   Patellofemoral arthritis of right knee 01/18/2020   Cold sore 01/18/2020   CLL (chronic lymphocytic leukemia) (HCC) 03/16/2018   Vaccine contraindicated - live vaccines contraindicated 04/20/2014   Waldenstroms macroglobulinemia - followed by Dr. Verdel Gitelman, [facility] 03/15/2014   Chronic cough - seeing pulmonologist in [place], [facility] 03/14/2014   Disorder of bone and cartilage 12/05/2008    PCP: Aida House, MD  REFERRING PROVIDER: Syliva Even, MD  REFERRING DIAG: ***  THERAPY DIAG:  No diagnosis found.  ONSET DATE: ***  Rationale for Evaluation and Treatment: Rehabilitation  SUBJECTIVE:   SUBJECTIVE STATEMENT: *** Pt accompanied by: {accompnied:27141}  PERTINENT HISTORY: CTS, chronic cough, R tib/fib ORIF, CLL  PAIN:  Are you having pain? {OPRCPAIN:27236}  PRECAUTIONS: {Therapy precautions:24002}  RED FLAGS: {PT Red Flags:29287}   WEIGHT BEARING  RESTRICTIONS: {Yes ***/No:24003}  FALLS: Has patient fallen in last 6 months? {fallsyesno:27318}  LIVING ENVIRONMENT: Lives with: {OPRC lives with:25569::"lives with their family"} Lives in: {Lives in:25570} Stairs: {opstairs:27293} Has following equipment at home: {Assistive devices:23999}  PLOF: {PLOF:24004}  PATIENT GOALS: ***  OBJECTIVE:  Note: Objective measures were completed at Evaluation unless otherwise noted.  DIAGNOSTIC FINDINGS: ***  COGNITION: Overall cognitive status: {cognition:24006}   SENSATION: {sensation:27233}  POSTURE:  {posture:25561}  LOWER EXTREMITY AROM:   AROM Right eval Left eval  Hip flexion    Hip abduction    Hip adduction    Hip internal rotation    Hip external rotation    Knee flexion    Knee extension    Ankle dorsiflexion    Ankle plantarflexion    Ankle inversion    Ankle eversion    (Blank rows = not tested)   LOWER EXTREMITY MMT:   MMT Right eval Left eval  Hip flexion    Hip abduction    Hip adduction    Hip internal rotation    Hip external rotation    Knee flexion    Knee extension    Ankle dorsiflexion    Ankle plantarflexion    Ankle inversion    Ankle eversion    (Blank rows = not tested)  GAIT: Gait pattern: {gait characteristics:25376} Distance walked: *** Assistive device utilized: {Assistive devices:23999} Level of assistance: {Levels of assistance:24026} Comments: ***  FUNCTIONAL TESTS:  {Functional tests:24029}  PATIENT SURVEYS:  DHI ***  VESTIBULAR ASSESSMENT:   GENERAL OBSERVATION: ***   OCULOMOTOR EXAM: {Ocular Alignment:32714:s}   Ocular ROM: {RANGE OF MOTION:21649}   Spontaneous Nystagmus: {Spontaneous nystagmus:25263}  Gaze-Induced Nystagmus: {gaze-induced nystagmus:25264}   Smooth Pursuits: {smooth pursuit:25265}   Saccades: {saccades:25266}   Convergence/Divergence: *** cm    VESTIBULAR - OCULAR REFLEX:    Slow VOR: {slow VOR:25290}   VOR Cancellation: {vor  cancellation:25291}   Head-Impulse Test: {head impulse test:25272}   Dynamic Visual Acuity: {dynamic visual acuity:25273}  AUDITORY SCREEN:  Rinne Test {DESC;Negative/Positive:115700014}    POSITIONAL TESTING:  Right Roll Test: *** Left Roll Test: ***  Right Loaded Dix-Hallpike: *** Left Loaded Dix-Hallpike: ***  Right Sidelying: *** Left Sidelying: ***     MOTION SENSITIVITY:    Motion Sensitivity Quotient  Intensity: 0 = none, 1 = Lightheaded, 2 = Mild, 3 = Moderate, 4 = Severe, 5 = Vomiting  Intensity  1. Sitting to supine   2. Supine to L side   3. Supine to R side   4. Supine to sitting   5. L Hallpike-Dix   6. Up from L    7. R Hallpike-Dix   8. Up from R    9. Sitting, head  tipped to L knee   10. Head up from L  knee   11. Sitting, head  tipped to R knee   12. Head up from R  knee   13. Sitting head turns x5   14.Sitting head nods x5   15. In stance, 180  turn to L    16. In stance, 180  turn to R                                                                                                                                 TREATMENT DATE: ***   Canalith Repositioning:  {Canalith Repositioning:25283} Gaze Adaptation:  {gaze adaptation:25286} Habituation:  {habituation:25288} Other: ***  PATIENT EDUCATION: Education details: *** Person educated: {Person educated:25204} Education method: {Education Method:25205} Education comprehension: {Education Comprehension:25206}  HOME EXERCISE PROGRAM:  GOALS: Goals reviewed with patient? Yes  SHORT TERM GOALS: Target date: {follow up:25551}  Patient to be independent with initial HEP. Baseline: HEP initiated Goal status: {GOALSTATUS:25110}    LONG TERM GOALS: Target date: {follow up:25551}  Patient to be independent with advanced HEP. Baseline: Not yet initiated  Goal status: {GOALSTATUS:25110}  Patient to report 0/10 dizziness with standing vertical and horizontal VOR for 30  seconds. Baseline: Unable Goal status: {GOALSTATUS:25110}  Patient will report 0/10 dizziness with bed mobility.  Baseline: Symptomatic  Goal status: {GOALSTATUS:25110}  Patient to demonstrate *** sway with M-CTSIB condition with eyes closed/foam surface in order to improve safety in environments with uneven surfaces and dim lighting. Baseline: *** Goal status: {GOALSTATUS:25110}  Patient to score at least 20/24 on DGI in order to decrease risk of falls. Baseline: *** Goal status: {GOALSTATUS:25110}  Patient will ambulate over outdoor surfaces with LRAD while performing head turns to scan environment with good stability in order to indicate safe community mobility. Baseline: Unable Goal status: {GOALSTATUS:25110}  Patient to score at least 18 points  less on DHI in order to meet MCID and improve functional outcomes.  Baseline: *** Goal status: {GOALSTATUS:25110}   ASSESSMENT:  CLINICAL IMPRESSION:   Patient is a 73 y/o F presenting to OPPT with c/o dizziness for the past ***.   Denies head trauma, infection/illness, vision changes/double vision, hearing loss, tinnitus, otalgia, photo/phonophobia.  Oculomotor exam revealed ***.  Positional testing was ***.   Patient was educated on gentle *** HEP and reported understanding. Would benefit from skilled PT services ***x/week for *** weeks to address aforementioned impairments in order to optimize level of function.    OBJECTIVE IMPAIRMENTS: {opptimpairments:25111}.   ACTIVITY LIMITATIONS: {activitylimitations:27494}  PARTICIPATION LIMITATIONS: {participationrestrictions:25113}  PERSONAL FACTORS: {Personal factors:25162} are also affecting patient's functional outcome.   REHAB POTENTIAL: {rehabpotential:25112}  CLINICAL DECISION MAKING: {clinical decision making:25114}  EVALUATION COMPLEXITY: {Evaluation complexity:25115}   PLAN:  PT FREQUENCY: {rehab frequency:25116}  PT DURATION: {rehab  duration:25117}  PLANNED INTERVENTIONS: {rehab planned interventions:25118::"97110-Therapeutic exercises","97530- Therapeutic [phone]- Neuromuscular re-education","97535- Self FAOZ","30865- Manual therapy"}  PLAN FOR NEXT SESSION: ***

## 2024-05-11 NOTE — Telephone Encounter (Signed)
 Pt concerned about nausea as a side affect of Meloxicam , wonders if there is something else that Dr. Alease Hunter recommends. She is currently in vestibular therapy for vertigo which causes nausea.   Forwarding to Dr. Alease Hunter to review and advise.

## 2024-05-11 NOTE — Progress Notes (Signed)
Vascular ultrasound shows no evidence of DVT.

## 2024-05-11 NOTE — Telephone Encounter (Signed)
 She is concerned about nausea, as she already has nausea related to vertigo.

## 2024-05-12 ENCOUNTER — Ambulatory Visit: Attending: Family Medicine | Admitting: Physical Therapy

## 2024-05-12 ENCOUNTER — Encounter: Payer: Self-pay | Admitting: Family Medicine

## 2024-05-12 DIAGNOSIS — R2681 Unsteadiness on feet: Secondary | ICD-10-CM | POA: Insufficient documentation

## 2024-05-12 DIAGNOSIS — M25571 Pain in right ankle and joints of right foot: Secondary | ICD-10-CM | POA: Insufficient documentation

## 2024-05-12 DIAGNOSIS — R42 Dizziness and giddiness: Secondary | ICD-10-CM | POA: Insufficient documentation

## 2024-05-12 DIAGNOSIS — R262 Difficulty in walking, not elsewhere classified: Secondary | ICD-10-CM | POA: Insufficient documentation

## 2024-05-12 NOTE — Telephone Encounter (Signed)
 Forwarding to Dr. Denyse Amass as Lorain Childes.

## 2024-05-12 NOTE — Progress Notes (Signed)
Vascular ultrasound shows no DVT.

## 2024-05-13 NOTE — Therapy (Signed)
 OUTPATIENT PHYSICAL THERAPY VESTIBULAR EVALUATION     Patient Name: Paula [name] MRN: [id number] DOB:[birth year], 73 y.o., female Today's Date: 05/14/2024  END OF SESSION:  PT End of Session - 05/14/24 0846     Visit Number 1    Number of Visits 17    Date for PT Re-Evaluation 07/16/24    Authorization Type Medicare/[employer]    PT Start Time 0801    PT Stop Time 0844    PT Time Calculation (min) 43 min    Equipment Utilized During Treatment Gait belt    Activity Tolerance Patient tolerated treatment well    Behavior During Therapy [facility] for tasks assessed/performed             Past Medical History:  Diagnosis Date   CARPAL TUNNEL SYNDROME, MILD 12/05/2008   Chronic cough - seeing pulmonologist in [place], [facility] 03/14/2014   HSV 03/01/2010   Leg skin lesion, left 01/30/2015   precancerous cells per patient   OSTEOPENIA 12/05/2008   PARESTHESIA 09/01/2009   Waldenstroms macroglobulinemia - followed by Dr. Keturah Peer, [place], [facility] 03/15/2014   Past Surgical History:  Procedure Laterality Date   CHOLECYSTECTOMY  02/2017   LEG SKIN LESION  BIOPSY / EXCISION Left 01/30/2015   Dr Mariah Shines Gould-precancerous cells per patient   ORIF TIBIA & FIBULA FRACTURES Right 1988   TONSILLECTOMY AND ADENOIDECTOMY  1958   Patient Active Problem List   Diagnosis Date Noted   Synovial cyst of wrist, right 10/18/2022   Arthritis of right ankle 11/28/2021   Patellofemoral arthritis of right knee 01/18/2020   Cold sore 01/18/2020   CLL (chronic lymphocytic leukemia) (HCC) 03/16/2018   Vaccine contraindicated - live vaccines contraindicated 04/20/2014   Waldenstroms macroglobulinemia - followed by Dr. Verdel Gitelman, [facility] 03/15/2014   Chronic cough - seeing pulmonologist in [place], [facility] 03/14/2014   Disorder of bone and cartilage 12/05/2008    PCP: Aida House, MD  REFERRING PROVIDER: Syliva Even, MD  REFERRING  DIAG: [phone] (ICD-10-CM) - Chronic pain of right lower extremity R42 (ICD-10-CM) - Dizziness  THERAPY DIAG:  Dizziness and giddiness  Unsteadiness on feet  Pain in right ankle and joints of right foot  Difficulty in walking, not elsewhere classified  ONSET DATE: 1 month  Rationale for Evaluation and Treatment: Rehabilitation  SUBJECTIVE:   SUBJECTIVE STATEMENT:  Patient reports that she had vertigo 5 and 7 years ago. First started when doing Pilates classes. Cleared it up with exercises shown by a PT. Since then she has been moving slowly especially when lying down in bed. Reports 1 month ago she started to get random episodes of dizziness, lasting 20 sec, but not associated with certain movements or positions. Denies head trauma, infection/illness, vision changes/double vision, hearing loss, migraines. Also reports R medial ankle pain which started before COVID- did a lot of ankle movement on the reformer. - tries not to bend it and having trouble navigating stairs. Some better with shots. Unable to stand in line for grocery store- legs start shaking, difficulty getting up/down from the floor , LE stiffness.    Pt accompanied by: self  PERTINENT HISTORY: CTS, chronic cough, R tib/fib ORIF, CLL  PAIN:  Are you having pain? Yes: NPRS scale: 2/10 Pain location: R medial ankle Pain description: constant sore Aggravating factors: bending the ankle, walking  Relieving factors: hokas, shots, massage  PRECAUTIONS: Fall  RED FLAGS: None   WEIGHT BEARING RESTRICTIONS: No  FALLS: Has patient  fallen in last 6 months? No  LIVING ENVIRONMENT: Lives with: lives with their spouse Lives in: House/apartment Stairs: 3 story home; 3-7 steps to enter Has following equipment at home: Single point cane and Environmental consultant - 2 wheeled  PLOF: Independent, Vocation/Vocational requirements: retired, and Leisure: pilates   PATIENT GOALS: improve dizziness and R ankle pain  OBJECTIVE:  Note:  Objective measures were completed at Evaluation unless otherwise noted.  DIAGNOSTIC FINDINGS:  Doppler was negative   COGNITION: Overall cognitive status: Within functional limits for tasks assessed  POSTURE:  Slight forward head  GAIT: Gait pattern: antalgic on R LE; slightly unsteady  Assistive device utilized: None Level of assistance: SBA  PATIENT SURVEYS:  DHI 32/100  VESTIBULAR ASSESSMENT:   GENERAL OBSERVATION: pt wears trifocals    OCULOMOTOR EXAM: Ocular Alignment: Cover/Uncover Test: WNL Cover/Cross Cover Test: WNL   Ocular ROM: No Limitations   Spontaneous Nystagmus: absent   Gaze-Induced Nystagmus: absent   Smooth Pursuits: intact   Saccades: intact     VESTIBULAR - OCULAR REFLEX:    Slow VOR: Normal   VOR Cancellation: Normal   Head-Impulse Test: HIT Right: positive HIT Left: positive     POSITIONAL TESTING:  Right Roll Test: negative Left Roll Test: negative   Right Loaded Dix-Hallpike: negative Left Loaded Dix-Hallpike: negative    M-CTSIB  Condition 1: Firm Surface, EO 30 Sec, Normal Sway  Condition 2: Firm Surface, EC 30 Sec, Mild and Moderate Sway  Condition 3: Foam Surface, EO 30 Sec, Mild and Moderate Sway  Condition 4: Foam Surface, EC 6 Sec, Severe Sway                                                                                                                              TREATMENT DATE: 05/14/24  PATIENT EDUCATION: Education details: exam findings and edu on differences between vestibular loss and positional vertigo, prognosis, POC, edu on benefits of OT- pt agreeable; advised to keep a journal of which activities bring on dizziness Person educated: Patient Education method: Explanation Education comprehension: verbalized understanding  HOME EXERCISE PROGRAM:  GOALS: Goals reviewed with patient? Yes  SHORT TERM GOALS: Target date: 06/11/2024  Patient to be independent with initial HEP. Baseline: HEP initiated Goal  status: INITIAL    LONG TERM GOALS: Target date: 07/16/2024  Patient to be independent with advanced HEP. Baseline: Not yet initiated  Goal status: INITIAL  Patient to report 0/10 dizziness with standing vertical and horizontal VOR for 30 seconds. Baseline: Unable Goal status: INITIAL  Patient to demonstrate 30 sec without LOB on M-CTSIB condition with eyes closed/foam surface in order to improve safety in environments with uneven surfaces and dim lighting. Baseline: 6 sec Goal status: INITIAL  Patient to score at least 23/30 on FGA in order to decrease risk of falls. Baseline: NT Goal status: INITIAL  Patient to report tolerance for standing for 1 hour without pain limiting.  Baseline: Unable to tolerate standing  in grocery line Goal status: INITIAL  Patient to score at least 18 points less on DHI in order to meet MCID and improve functional outcomes.  Baseline: 32 Goal status: INITIAL   ASSESSMENT:  CLINICAL IMPRESSION:   Patient is a 73 y/o F presenting to OPPT with c/o dizziness for the past month and chronic R ankle pain. Patient today presenting with gait deviations, imbalance- especially with multisensory challenges, and B + HIT. Further assessment next session for R ankle.  Would benefit from skilled PT services 2x/week for 8 weeks to address aforementioned impairments in order to optimize level of function.    OBJECTIVE IMPAIRMENTS: Abnormal gait, decreased activity tolerance, decreased balance, decreased knowledge of use of DME, decreased mobility, difficulty walking, decreased ROM, dizziness, hypomobility, impaired flexibility, postural dysfunction, and pain.   ACTIVITY LIMITATIONS: carrying, lifting, bending, standing, squatting, stairs, transfers, bed mobility, bathing, reach over head, hygiene/grooming, and locomotion level  PARTICIPATION LIMITATIONS: meal prep, cleaning, laundry, driving, shopping, community activity, yard work, and church  PERSONAL FACTORS:  Age, Past/current experiences, Time since onset of injury/illness/exacerbation, and 3+ comorbidities: CTS, chronic cough, R tib/fib ORIF, CLL are also affecting patient's functional outcome.   REHAB POTENTIAL: Good  CLINICAL DECISION MAKING: Evolving/moderate complexity  EVALUATION COMPLEXITY: Moderate   PLAN:  PT FREQUENCY: 2x/week  PT DURATION: 8 weeks  PLANNED INTERVENTIONS: 97164- PT Re-evaluation, 97750- Physical Performance Testing, 97110-Therapeutic exercises, 97530- Therapeutic activity, 97112- Neuromuscular re-education, 97535- Self Care, 16109- Manual therapy, [phone]- Gait training, [phone]- Canalith repositioning, [id number]- Aquatic Therapy, [phone]- Electrical stimulation (unattended), Patient/Family education, Balance training, Stair training, Taping, Dry Needling, Joint mobilization, Spinal mobilization, Vestibular training, DME instructions, Cryotherapy, and Moist heat  PLAN FOR NEXT SESSION: FGA, R ankle ROM and LE strength; palpate for pt tenderness; update goals if needed; start on HEP to for multisensory balance and habituation      Thaddeus Filippo, PT, DPT 05/14/24 11:42 AM  [facility] at [facility] [address] [place], [place] [address] Phone # [phone] Fax # [phone]

## 2024-05-14 ENCOUNTER — Other Ambulatory Visit: Payer: Self-pay

## 2024-05-14 ENCOUNTER — Telehealth: Payer: Self-pay | Admitting: Physical Therapy

## 2024-05-14 ENCOUNTER — Ambulatory Visit: Admitting: Physical Therapy

## 2024-05-14 DIAGNOSIS — R2681 Unsteadiness on feet: Secondary | ICD-10-CM | POA: Diagnosis present

## 2024-05-14 DIAGNOSIS — M25571 Pain in right ankle and joints of right foot: Secondary | ICD-10-CM

## 2024-05-14 DIAGNOSIS — R42 Dizziness and giddiness: Secondary | ICD-10-CM | POA: Diagnosis present

## 2024-05-14 DIAGNOSIS — M25531 Pain in right wrist: Secondary | ICD-10-CM

## 2024-05-14 DIAGNOSIS — R262 Difficulty in walking, not elsewhere classified: Secondary | ICD-10-CM | POA: Diagnosis present

## 2024-05-14 NOTE — Telephone Encounter (Signed)
 Hi Dr. Alease Hunter,  Thanks for referring Ms. Coriz for dizziness and R LE pain. She also reported chronic hx of B wrist/thumb pain that I believe would be best managed by OT. She was agreeable with this plan.  If you agree, please place an order in [facility] in [facility] or fax the order to [phone].  Ledon Pry, PT, DPT 05/14/24 11:45 AM  [facility] at [facility] [address] [place], [address] [place], [place] [address] Phone # [phone] Fax # [phone]

## 2024-05-16 NOTE — Telephone Encounter (Signed)
 Occupational Therapy order placed to [facility] for bilateral hand/wrist pain.

## 2024-05-16 NOTE — Addendum Note (Signed)
 Addended by: Syliva Even on: 05/16/2024 01:58 PM   Modules accepted: Orders

## 2024-05-16 NOTE — Telephone Encounter (Signed)
 I think that is pretty unlikely to occur with meloxicam .

## 2024-05-21 ENCOUNTER — Other Ambulatory Visit: Payer: Self-pay | Admitting: Medical Genetics

## 2024-05-28 ENCOUNTER — Other Ambulatory Visit: Payer: Self-pay | Admitting: Internal Medicine

## 2024-05-28 ENCOUNTER — Ambulatory Visit
Admission: RE | Admit: 2024-05-28 | Discharge: 2024-05-28 | Disposition: A | Discharge status: Home / Self Care | Source: Ambulatory Visit | Attending: Internal Medicine | Admitting: Internal Medicine

## 2024-05-28 ENCOUNTER — Ambulatory Visit: Payer: Self-pay | Admitting: Family Medicine

## 2024-05-28 DIAGNOSIS — N6341 Unspecified lump in right breast, subareolar: Secondary | ICD-10-CM

## 2024-05-28 DIAGNOSIS — M79629 Pain in unspecified upper arm: Secondary | ICD-10-CM

## 2024-05-28 NOTE — Therapy (Signed)
 OUTPATIENT PHYSICAL THERAPY VESTIBULAR TREATMENT     Patient Name: Paula [name] MRN: [id number] DOB:April 28, 1951, 73 y.o., female Today's Date: 05/31/2024  END OF SESSION:  PT End of Session - 05/31/24 0845     Visit Number 2    Number of Visits 17    Date for PT Re-Evaluation 07/16/24    Authorization Type Medicare/Aetna    PT Start Time 0804    PT Stop Time 0844    PT Time Calculation (min) 40 min    Equipment Utilized During Treatment Gait belt    Activity Tolerance Patient tolerated treatment well    Behavior During Therapy [facility] for tasks assessed/performed              Past Medical History:  Diagnosis Date   CARPAL TUNNEL SYNDROME, MILD 12/05/2008   Chronic cough - seeing pulmonologist in [place], [facility] 03/14/2014   HSV 03/01/2010   Leg skin lesion, left 01/30/2015   precancerous cells per patient   OSTEOPENIA 12/05/2008   PARESTHESIA 09/01/2009   Waldenstroms macroglobulinemia - followed by Dr. Keturah Peer, [place], [facility] 03/15/2014   Past Surgical History:  Procedure Laterality Date   CHOLECYSTECTOMY  02/2017   LEG SKIN LESION  BIOPSY / EXCISION Left 01/30/2015   Dr Mariah Shines Gould-precancerous cells per patient   ORIF TIBIA & FIBULA FRACTURES Right 1988   TONSILLECTOMY AND ADENOIDECTOMY  1958   Patient Active Problem List   Diagnosis Date Noted   Synovial cyst of wrist, right 10/18/2022   Arthritis of right ankle 11/28/2021   Patellofemoral arthritis of right knee 01/18/2020   Cold sore 01/18/2020   CLL (chronic lymphocytic leukemia) (HCC) 03/16/2018   Vaccine contraindicated - live vaccines contraindicated 04/20/2014   Waldenstroms macroglobulinemia - followed by Dr. Keturah Peer, [place], [facility] 03/15/2014   Chronic cough - seeing pulmonologist in [place], [facility] 03/14/2014   Disorder of bone and cartilage 12/05/2008    PCP: Aida House, MD  REFERRING PROVIDER: Syliva Even, MD  REFERRING  DIAG: [phone] (ICD-10-CM) - Chronic pain of right lower extremity R42 (ICD-10-CM) - Dizziness  THERAPY DIAG:  Dizziness and giddiness  Unsteadiness on feet  Pain in right ankle and joints of right foot  Difficulty in walking, not elsewhere classified  ONSET DATE: 1 month  Rationale for Evaluation and Treatment: Rehabilitation  SUBJECTIVE:   SUBJECTIVE STATEMENT: Reports that she noticed within a day or so of last PT appointment, she noticed some dizziness with head turns to both directions and with head nods. Asking about addressing her pain behind the R knee. Reports that she had to wait for blood to circulate before she is able to put weight on the R LE. Reports trouble sleeping d/t this pain.    Pt accompanied by: self  PERTINENT HISTORY: CTS, chronic cough, R tib/fib ORIF, CLL  PAIN:  Are you having pain? Yes: NPRS scale: 1-3/10 Pain location: R thumb, R posterior knee Pain description: constant sore Aggravating factors: bending the ankle, walking  Relieving factors: hokas, shots, massage  PRECAUTIONS: Fall  RED FLAGS: None   WEIGHT BEARING RESTRICTIONS: No  FALLS: Has patient fallen in last 6 months? No  LIVING ENVIRONMENT: Lives with: lives with their spouse Lives in: House/apartment Stairs: 3 story home; 3-7 steps to enter Has following equipment at home: Single point cane and Environmental consultant - 2 wheeled  PLOF: Independent, Vocation/Vocational requirements: retired, and Leisure: pilates   PATIENT GOALS: improve dizziness and R ankle pain  OBJECTIVE:  TODAY'S TREATMENT: 05/31/24 Activity Comments  FGA 17/30  palpation No TTP over R posterior knee, calf, or ankle. R medial/anterior ankle mildly edematous     LOWER EXTREMITY AROM:    AROM Right 05/31/24 Left 05/31/24  Hip flexion    Hip extension    Hip abduction    Hip adduction    Hip internal rotation    Hip external rotation    Knee flexion    Knee extension 1 -2  Ankle dorsiflexion 0 8   Ankle plantarflexion 58 60  Ankle inversion 15 17  Ankle eversion 16 20   (Blank rows = not tested)    LOWER EXTREMITY MMT:    MMT Right 05/31/24 Left 05/31/24  Hip flexion    Hip extension    Hip abduction    Hip adduction    Hip internal rotation    Hip external rotation    Knee flexion 4+ 4+  Knee extension 4 4  Ankle dorsiflexion 4+ 5  Ankle plantarflexion Unable to perform 1 rep 20 reps  Ankle inversion 3+ 4+  Ankle eversion 4+ 4   (Blank rows = not tested)     [facility] PT Assessment - 05/31/24 0001       Functional Gait  Assessment   Gait assessed  Yes    Gait Level Surface Cannot walk 20 ft without assistance, severe gait deviations or imbalance deviates greater than 15 in outside of the 12 in walkway width or reaches and touches the wall    Change in Gait Speed Makes only minor adjustments to walking speed, or accomplishes a change in speed with significant gait deviations, deviates 10-15 in outside the 12 in walkway width, or changes speed but loses balance but is able to recover and continue walking.    Gait with Horizontal Head Turns Performs head turns smoothly with no change in gait. Deviates no more than 6 in outside 12 in walkway width    Gait with Vertical Head Turns Performs head turns with no change in gait. Deviates no more than 6 in outside 12 in walkway width.    Gait and Pivot Turn Pivot turns safely in greater than 3 sec and stops with no loss of balance, or pivot turns safely within 3 sec and stops with mild imbalance, requires small steps to catch balance.    Step Over Obstacle Is able to step over one shoe box (4.5 in total height) but must slow down and adjust steps to clear box safely. May require verbal cueing.    Gait with Narrow Base of Support Ambulates 4-7 steps.    Gait with Eyes Closed Walks 20 ft, uses assistive device, slower speed, mild gait deviations, deviates 6-10 in outside 12 in walkway width. Ambulates 20 ft in less than 9 sec but greater  than 7 sec.    Ambulating Backwards Walks 20 ft, uses assistive device, slower speed, mild gait deviations, deviates 6-10 in outside 12 in walkway width.    Steps Alternating feet, must use rail.    Total Score 17               HOME EXERCISE PROGRAM Last updated: 05/31/24 Access Code: [email or web address] URL: [email or web address] Date: 05/31/2024 Prepared by: [facility] - Outpatient  Rehab - [facility]  Exercises - Standing Gastroc Stretch at Counter  - 1 x daily - 5 x weekly - 2 sets - 30 sec hold - Heel Raises with Counter Support  - 1 x daily - 5 x  weekly - 2 sets - 10 reps - Ankle Inversion Eversion Towel Slide  - 1 x daily - 5 x weekly - 2 sets - 10-15 reps - Seated Hamstring Stretch  - 1 x daily - 5 x weekly - 2 sets - 30 sec hold     PATIENT EDUCATION: Education details: exam findings, encouraged increased activity throughout the day, HEP and advised to stop if pain occurs; answered all pt's questions  Person educated: Patient Education method: Explanation, Demonstration, Tactile cues, Verbal cues, and Handouts Education comprehension: verbalized understanding and returned demonstration    Note: Objective measures were completed at Evaluation unless otherwise noted.  DIAGNOSTIC FINDINGS:  Doppler was negative   COGNITION: Overall cognitive status: Within functional limits for tasks assessed  POSTURE:  Slight forward head  GAIT: Gait pattern: antalgic on R LE; slightly unsteady  Assistive device utilized: None Level of assistance: SBA  PATIENT SURVEYS:  DHI 32/100  VESTIBULAR ASSESSMENT:   GENERAL OBSERVATION: pt wears trifocals    OCULOMOTOR EXAM: Ocular Alignment: Cover/Uncover Test: WNL Cover/Cross Cover Test: WNL   Ocular ROM: No Limitations   Spontaneous Nystagmus: absent   Gaze-Induced Nystagmus: absent   Smooth Pursuits: intact   Saccades: intact     VESTIBULAR - OCULAR REFLEX:    Slow VOR: Normal   VOR Cancellation:  Normal   Head-Impulse Test: HIT Right: positive HIT Left: positive     POSITIONAL TESTING:  Right Roll Test: negative Left Roll Test: negative   Right Loaded Dix-Hallpike: negative Left Loaded Dix-Hallpike: negative    M-CTSIB  Condition 1: Firm Surface, EO 30 Sec, Normal Sway  Condition 2: Firm Surface, EC 30 Sec, Mild and Moderate Sway  Condition 3: Foam Surface, EO 30 Sec, Mild and Moderate Sway  Condition 4: Foam Surface, EC 6 Sec, Severe Sway                                                                                                                              TREATMENT DATE: 05/14/24  PATIENT EDUCATION: Education details: exam findings and edu on differences between vestibular loss and positional vertigo, prognosis, POC, edu on benefits of OT- pt agreeable; advised to keep a journal of which activities bring on dizziness Person educated: Patient Education method: Explanation Education comprehension: verbalized understanding  HOME EXERCISE PROGRAM:  GOALS: Goals reviewed with patient? Yes  SHORT TERM GOALS: Target date: 06/11/2024  Patient to be independent with initial HEP. Baseline: HEP initiated Goal status: IN PROGRESS    LONG TERM GOALS: Target date: 07/16/2024  Patient to be independent with advanced HEP. Baseline: Not yet initiated  Goal status: IN PROGRESS  Patient to report 0/10 dizziness with standing vertical and horizontal VOR for 30 seconds. Baseline: Unable Goal status: IN PROGRESS  Patient to demonstrate 30 sec without LOB on M-CTSIB condition with eyes closed/foam surface in order to improve safety in environments with uneven surfaces and dim lighting. Baseline: 6 sec Goal status:  IN PROGRESS  Patient to score at least 23/30 on FGA in order to decrease risk of falls. Baseline: NT Goal status: IN PROGRESS  Patient to report tolerance for standing for 1 hour without pain limiting.  Baseline: Unable to tolerate standing in grocery  line Goal status: IN PROGRESS  Patient to score at least 18 points less on DHI in order to meet MCID and improve functional outcomes.  Baseline: 32 Goal status: IN PROGRESS   Patient to demonstrate R ankle AROM improved by at least 5 degrees.  Baseline: see visit 05/31/24 Goal status: INITIAL    ASSESSMENT:  CLINICAL IMPRESSION:  Patient arrived to session with report of noticing some aggravating factors including head turns and nods. Reports R posterior knee pain in addition to her aforementioned ankle pain. Patient scored 17/30 on FGA, indicating an increased risk of falls. R ankle AROM appeared more limited than opposite side and patient demonstrated significant weakness on R vs. L LE. HEP was provided to address deficits and all questions were answered. No complaints at end of session.   OBJECTIVE IMPAIRMENTS: Abnormal gait, decreased activity tolerance, decreased balance, decreased knowledge of use of DME, decreased mobility, difficulty walking, decreased ROM, dizziness, hypomobility, impaired flexibility, postural dysfunction, and pain.   ACTIVITY LIMITATIONS: carrying, lifting, bending, standing, squatting, stairs, transfers, bed mobility, bathing, reach over head, hygiene/grooming, and locomotion level  PARTICIPATION LIMITATIONS: meal prep, cleaning, laundry, driving, shopping, community activity, yard work, and church  PERSONAL FACTORS: Age, Past/current experiences, Time since onset of injury/illness/exacerbation, and 3+ comorbidities: CTS, chronic cough, R tib/fib ORIF, CLL are also affecting patient's functional outcome.   REHAB POTENTIAL: Good  CLINICAL DECISION MAKING: Evolving/moderate complexity  EVALUATION COMPLEXITY: Moderate   PLAN:  PT FREQUENCY: 2x/week  PT DURATION: 8 weeks  PLANNED INTERVENTIONS: 97164- PT Re-evaluation, 97750- Physical Performance Testing, 97110-Therapeutic exercises, 97530- Therapeutic activity, 97112- Neuromuscular re-education, 97535-  Self Care, 16109- Manual therapy, [phone]- Gait training, [phone]- Canalith repositioning, [id number]- Aquatic Therapy, [phone]- Electrical stimulation (unattended), Patient/Family education, Balance training, Stair training, Taping, Dry Needling, Joint mobilization, Spinal mobilization, Vestibular training, DME instructions, Cryotherapy, and Moist heat  PLAN FOR NEXT SESSION: R ankle ROM and LE strength; multisensory balance and habituation      Thaddeus Filippo, PT, DPT 05/31/24 8:46 AM  [facility] at [facility] [address] [place], [place] [address] Phone # [phone] Fax # [phone]

## 2024-05-31 ENCOUNTER — Ambulatory Visit: Attending: Family Medicine | Admitting: Physical Therapy

## 2024-05-31 ENCOUNTER — Ambulatory Visit (INDEPENDENT_AMBULATORY_CARE_PROVIDER_SITE_OTHER)

## 2024-05-31 ENCOUNTER — Encounter: Payer: Self-pay | Admitting: Physical Therapy

## 2024-05-31 ENCOUNTER — Other Ambulatory Visit: Payer: Self-pay

## 2024-05-31 ENCOUNTER — Ambulatory Visit (INDEPENDENT_AMBULATORY_CARE_PROVIDER_SITE_OTHER): Admitting: Family Medicine

## 2024-05-31 ENCOUNTER — Encounter: Payer: Self-pay | Admitting: Family Medicine

## 2024-05-31 VITALS — BP 142/88 | HR 68 | Ht 65.0 in | Wt 164.0 lb

## 2024-05-31 DIAGNOSIS — M25531 Pain in right wrist: Secondary | ICD-10-CM

## 2024-05-31 DIAGNOSIS — R262 Difficulty in walking, not elsewhere classified: Secondary | ICD-10-CM

## 2024-05-31 DIAGNOSIS — R2681 Unsteadiness on feet: Secondary | ICD-10-CM

## 2024-05-31 DIAGNOSIS — M25532 Pain in left wrist: Secondary | ICD-10-CM

## 2024-05-31 DIAGNOSIS — R42 Dizziness and giddiness: Secondary | ICD-10-CM | POA: Diagnosis present

## 2024-05-31 DIAGNOSIS — M25571 Pain in right ankle and joints of right foot: Secondary | ICD-10-CM
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
## 2024-05-31 NOTE — Patient Instructions (Addendum)
 Thank you for coming in today.   Please get an Xray today before you leave   Go to occupational therapy and previously referred.  Check back in 6 weeks

## 2024-06-01 ENCOUNTER — Ambulatory Visit: Payer: Self-pay | Admitting: Family Medicine

## 2024-06-01 NOTE — Therapy (Signed)
 OUTPATIENT PHYSICAL THERAPY VESTIBULAR TREATMENT     Patient Name: Paula [name] MRN: [id number] DOB:[birth year], 73 y.o., female Today's Date: 06/02/2024  END OF SESSION:  PT End of Session - 06/02/24 0841     Visit Number 3    Number of Visits 17    Date for PT Re-Evaluation 07/16/24    Authorization Type Medicare/[employer]    PT Start Time 0800    PT Stop Time 0842    PT Time Calculation (min) 42 min    Equipment Utilized During Treatment --    Activity Tolerance Patient tolerated treatment well    Behavior During Therapy [facility] for tasks assessed/performed               Past Medical History:  Diagnosis Date   CARPAL TUNNEL SYNDROME, MILD 12/05/2008   Chronic cough - seeing pulmonologist in [place], [facility] 03/14/2014   HSV 03/01/2010   Leg skin lesion, left 01/30/2015   precancerous cells per patient   OSTEOPENIA 12/05/2008   PARESTHESIA 09/01/2009   Waldenstroms macroglobulinemia - followed by Dr. Keturah Peer, [place], [facility] 03/15/2014   Past Surgical History:  Procedure Laterality Date   CHOLECYSTECTOMY  02/2017   LEG SKIN LESION  BIOPSY / EXCISION Left 01/30/2015   Dr Mariah Shines Gould-precancerous cells per patient   ORIF TIBIA & FIBULA FRACTURES Right 1988   TONSILLECTOMY AND ADENOIDECTOMY  1958   Patient Active Problem List   Diagnosis Date Noted   Synovial cyst of wrist, right 10/18/2022   Arthritis of right ankle 11/28/2021   Patellofemoral arthritis of right knee 01/18/2020   Cold sore 01/18/2020   CLL (chronic lymphocytic leukemia) (HCC) 03/16/2018   Vaccine contraindicated - live vaccines contraindicated 04/20/2014   Waldenstroms macroglobulinemia - followed by Dr. Keturah Peer, [place], [facility] 03/15/2014   Chronic cough - seeing pulmonologist in [place], [facility] 03/14/2014   Disorder of bone and cartilage 12/05/2008    PCP: Aida House, MD  REFERRING PROVIDER: Syliva Even, MD  REFERRING DIAG:  [phone] (ICD-10-CM) - Chronic pain of right lower extremity R42 (ICD-10-CM) - Dizziness  THERAPY DIAG:  Dizziness and giddiness  Unsteadiness on feet  Pain in right ankle and joints of right foot  Difficulty in walking, not elsewhere classified  ONSET DATE: 1 month  Rationale for Evaluation and Treatment: Rehabilitation  SUBJECTIVE:   SUBJECTIVE STATEMENT: "Actually better." Has been stretching and trying to use her core when she walks. Back of the leg is not as sore today. Brought in additional HEP from previous PT.   Pt accompanied by: self  PERTINENT HISTORY: CTS, chronic cough, R tib/fib ORIF, CLL  PAIN:  Are you having pain? Yes: NPRS scale: 1/10 Pain location: R thumb, R posterior knee Pain description: constant sore Aggravating factors: bending the ankle, walking  Relieving factors: hokas, shots, massage  PRECAUTIONS: Fall  RED FLAGS: None   WEIGHT BEARING RESTRICTIONS: No  FALLS: Has patient fallen in last 6 months? No  LIVING ENVIRONMENT: Lives with: lives with their spouse Lives in: House/apartment Stairs: 3 story home; 3-7 steps to enter Has following equipment at home: Single point cane and Environmental consultant - 2 wheeled  PLOF: Independent, Vocation/Vocational requirements: retired, and Leisure: pilates   PATIENT GOALS: improve dizziness and R ankle pain  OBJECTIVE:      TODAY'S TREATMENT: 06/02/24 Activity Comments  review of HEP: runner's stretch standing heel raise ankle INV/EV with towel HS stretch (pt prefers supine with strap) Holding knee to isolate ankle  ROM; more limited with inversion. Cueing for "low and slow" stretch rather than pushing into pain. Tolerated all activities well   sitting isometric R ankle INV/EV into ball against wall 5"x10 Good ability into EV, reports "tolerable" into INV  STS focusing on symmetric WBing Mat slightly elevated; pt reports posterior R knee "catching" over location of lateral HS tendon   R step ups 4"  15x  Report of feeling weakness in the R LE and ankle "cracking"; denies pain     HOME EXERCISE PROGRAM Last updated: 06/02/24 Access Code: [email or web address] URL: [email or web address] Date: 06/02/2024 Prepared by: [facility] - Outpatient  Rehab - [facility]  Exercises - Standing Gastroc Stretch at Counter  - 1 x daily - 5 x weekly - 2 sets - 30 sec hold - Heel Raises with Counter Support  - 1 x daily - 5 x weekly - 2 sets - 10 reps - Ankle Inversion Eversion Towel Slide  - 1 x daily - 5 x weekly - 2 sets - 10-15 reps - Hooklying Hamstring Stretch with Strap  - 1 x daily - 5 x weekly - 2 sets - 30 sec hold - Isometric Ankle Inversion at Wall  - 1 x daily - 5 x weekly - 2 sets - 10 reps - 5 sec  hold - Isometric Ankle Eversion at Wall  - 1 x daily - 5 x weekly - 2 sets - 10 reps - 5 sec  hold   PATIENT EDUCATION: Education details: detailed review and cueing for HEP; instructed on use of ice and elevation after PT sessions; HEP update, answered patient's questions  Person educated: Patient Education method: Explanation, Demonstration, Tactile cues, Verbal cues, and Handouts Education comprehension: verbalized understanding and returned demonstration    Note: Objective measures were completed at Evaluation unless otherwise noted.  DIAGNOSTIC FINDINGS:  Doppler was negative   COGNITION: Overall cognitive status: Within functional limits for tasks assessed  POSTURE:  Slight forward head  GAIT: Gait pattern: antalgic on R LE; slightly unsteady  Assistive device utilized: None Level of assistance: SBA  PATIENT SURVEYS:  DHI 32/100  VESTIBULAR ASSESSMENT:   GENERAL OBSERVATION: pt wears trifocals    OCULOMOTOR EXAM: Ocular Alignment: Cover/Uncover Test: WNL Cover/Cross Cover Test: WNL   Ocular ROM: No Limitations   Spontaneous Nystagmus: absent   Gaze-Induced Nystagmus: absent   Smooth Pursuits: intact   Saccades: intact     VESTIBULAR - OCULAR REFLEX:     Slow VOR: Normal   VOR Cancellation: Normal   Head-Impulse Test: HIT Right: positive HIT Left: positive     POSITIONAL TESTING:  Right Roll Test: negative Left Roll Test: negative   Right Loaded Dix-Hallpike: negative Left Loaded Dix-Hallpike: negative    M-CTSIB  Condition 1: Firm Surface, EO 30 Sec, Normal Sway  Condition 2: Firm Surface, EC 30 Sec, Mild and Moderate Sway  Condition 3: Foam Surface, EO 30 Sec, Mild and Moderate Sway  Condition 4: Foam Surface, EC 6 Sec, Severe Sway  TREATMENT DATE: 05/14/24  PATIENT EDUCATION: Education details: exam findings and edu on differences between vestibular loss and positional vertigo, prognosis, POC, edu on benefits of OT- pt agreeable; advised to keep a journal of which activities bring on dizziness Person educated: Patient Education method: Explanation Education comprehension: verbalized understanding  HOME EXERCISE PROGRAM:  GOALS: Goals reviewed with patient? Yes  SHORT TERM GOALS: Target date: 06/11/2024  Patient to be independent with initial HEP. Baseline: HEP initiated Goal status: IN PROGRESS    LONG TERM GOALS: Target date: 07/16/2024  Patient to be independent with advanced HEP. Baseline: Not yet initiated  Goal status: IN PROGRESS  Patient to report 0/10 dizziness with standing vertical and horizontal VOR for 30 seconds. Baseline: Unable Goal status: IN PROGRESS  Patient to demonstrate 30 sec without LOB on M-CTSIB condition with eyes closed/foam surface in order to improve safety in environments with uneven surfaces and dim lighting. Baseline: 6 sec Goal status: IN PROGRESS  Patient to score at least 23/30 on FGA in order to decrease risk of falls. Baseline: NT Goal status: IN PROGRESS  Patient to report tolerance for standing for 1 hour without pain limiting.  Baseline:  Unable to tolerate standing in grocery line Goal status: IN PROGRESS  Patient to score at least 18 points less on DHI in order to meet MCID and improve functional outcomes.  Baseline: 32 Goal status: IN PROGRESS   Patient to demonstrate R ankle AROM improved by at least 5 degrees.  Baseline: see visit 05/31/24 Goal status: INITIAL    ASSESSMENT:  CLINICAL IMPRESSION: Patient arrived to session with report of reduced pain today after stretching. Reviewed HEP to ensure max understanding and compliance. Patient performed gentle ankle strengthening activities within tolerance. Standing activities required cues to maintain symmetric use of LEs. Step ups were performed with some apprehension however patient was pain-free with this activity. No complaints at end of session.   OBJECTIVE IMPAIRMENTS: Abnormal gait, decreased activity tolerance, decreased balance, decreased knowledge of use of DME, decreased mobility, difficulty walking, decreased ROM, dizziness, hypomobility, impaired flexibility, postural dysfunction, and pain.   ACTIVITY LIMITATIONS: carrying, lifting, bending, standing, squatting, stairs, transfers, bed mobility, bathing, reach over head, hygiene/grooming, and locomotion level  PARTICIPATION LIMITATIONS: meal prep, cleaning, laundry, driving, shopping, community activity, yard work, and church  PERSONAL FACTORS: Age, Past/current experiences, Time since onset of injury/illness/exacerbation, and 3+ comorbidities: CTS, chronic cough, R tib/fib ORIF, CLL are also affecting patient's functional outcome.   REHAB POTENTIAL: Good  CLINICAL DECISION MAKING: Evolving/moderate complexity  EVALUATION COMPLEXITY: Moderate   PLAN:  PT FREQUENCY: 2x/week  PT DURATION: 8 weeks  PLANNED INTERVENTIONS: 97164- PT Re-evaluation, 97750- Physical Performance Testing, 97110-Therapeutic exercises, 97530- Therapeutic activity, 97112- Neuromuscular re-education, 97535- Self Care, 16109- Manual  therapy, [phone]- Gait training, [phone]- Canalith repositioning, [id number]- Aquatic Therapy, [phone]- Electrical stimulation (unattended), Patient/Family education, Balance training, Stair training, Taping, Dry Needling, Joint mobilization, Spinal mobilization, Vestibular training, DME instructions, Cryotherapy, and Moist heat  PLAN FOR NEXT SESSION: R ankle ROM and LE strength; multisensory balance and habituation      Thaddeus Filippo, PT, DPT 06/02/24 8:44 AM  [facility] at [facility] [address] [place], [place] [address] Phone # [phone] Fax # [phone]

## 2024-06-01 NOTE — Progress Notes (Signed)
 Left hand x-ray shows severe arthritis around the base of the thumb and wrist.

## 2024-06-01 NOTE — Progress Notes (Signed)
 Right hand x-ray shows severe arthritis at the base of the thumb and wrist.

## 2024-06-02 ENCOUNTER — Ambulatory Visit: Admitting: Physical Therapy

## 2024-06-02 ENCOUNTER — Ambulatory Visit: Admitting: Occupational Therapy

## 2024-06-02 ENCOUNTER — Encounter: Payer: Self-pay | Admitting: Physical Therapy

## 2024-06-02 DIAGNOSIS — R42 Dizziness and giddiness: Secondary | ICD-10-CM | POA: Diagnosis not present

## 2024-06-02 DIAGNOSIS — R262 Difficulty in walking, not elsewhere classified: Secondary | ICD-10-CM

## 2024-06-02 DIAGNOSIS — R2681 Unsteadiness on feet: Secondary | ICD-10-CM

## 2024-06-02 DIAGNOSIS — M25571 Pain in right ankle and joints of right foot: Secondary | ICD-10-CM

## 2024-06-02 DIAGNOSIS — M25531 Pain in right wrist: Secondary | ICD-10-CM
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
## 2024-06-04 ENCOUNTER — Ambulatory Visit: Admitting: Occupational Therapy

## 2024-06-07 NOTE — Therapy (Signed)
 OUTPATIENT PHYSICAL THERAPY VESTIBULAR TREATMENT     Patient Name: Paula [name] MRN: [id number] DOB:July 05, 1951, 73 y.o., female Today's Date: 06/07/2024  END OF SESSION:      Past Medical History:  Diagnosis Date   CARPAL TUNNEL SYNDROME, MILD 12/05/2008   Chronic cough - seeing pulmonologist in [place], [facility] 03/14/2014   HSV 03/01/2010   Leg skin lesion, left 01/30/2015   precancerous cells per patient   OSTEOPENIA 12/05/2008   PARESTHESIA 09/01/2009   Waldenstroms macroglobulinemia - followed by Dr. Verdel Gitelman, [facility] 03/15/2014   Past Surgical History:  Procedure Laterality Date   CHOLECYSTECTOMY  02/2017   LEG SKIN LESION  BIOPSY / EXCISION Left 01/30/2015   Dr Mariah Shines Gould-precancerous cells per patient   ORIF TIBIA & FIBULA FRACTURES Right 1988   TONSILLECTOMY AND ADENOIDECTOMY  1958   Patient Active Problem List   Diagnosis Date Noted   Synovial cyst of wrist, right 10/18/2022   Arthritis of right ankle 11/28/2021   Patellofemoral arthritis of right knee 01/18/2020   Cold sore 01/18/2020   CLL (chronic lymphocytic leukemia) (HCC) 03/16/2018   Vaccine contraindicated - live vaccines contraindicated 04/20/2014   Waldenstroms macroglobulinemia - followed by Dr. Verdel Gitelman, [facility] 03/15/2014   Chronic cough - seeing pulmonologist in [place], [facility] 03/14/2014   Disorder of bone and cartilage 12/05/2008    PCP: Aida House, MD  REFERRING PROVIDER: Syliva Even, MD  REFERRING DIAG: [phone] (ICD-10-CM) - Chronic pain of right lower extremity R42 (ICD-10-CM) - Dizziness  THERAPY DIAG:  No diagnosis found.  ONSET DATE: 1 month  Rationale for Evaluation and Treatment: Rehabilitation  SUBJECTIVE:   SUBJECTIVE STATEMENT: "Actually better." Has been stretching and trying to use her core when she walks. Back of the leg is not as sore today. Brought in additional HEP from  previous PT.   Pt accompanied by: self  PERTINENT HISTORY: CTS, chronic cough, R tib/fib ORIF, CLL  PAIN:  Are you having pain? Yes: NPRS scale: 1/10 Pain location: R thumb, R posterior knee Pain description: constant sore Aggravating factors: bending the ankle, walking  Relieving factors: hokas, shots, massage  PRECAUTIONS: Fall  RED FLAGS: None   WEIGHT BEARING RESTRICTIONS: No  FALLS: Has patient fallen in last 6 months? No  LIVING ENVIRONMENT: Lives with: lives with their spouse Lives in: House/apartment Stairs: 3 story home; 3-7 steps to enter Has following equipment at home: Single point cane and Environmental consultant - 2 wheeled  PLOF: Independent, Vocation/Vocational requirements: retired, and Leisure: pilates   PATIENT GOALS: improve dizziness and R ankle pain  OBJECTIVE:     TODAY'S TREATMENT: 06/08/24 Activity Comments                         TODAY'S TREATMENT: 06/02/24 Activity Comments  review of HEP: runner's stretch standing heel raise ankle INV/EV with towel HS stretch (pt prefers supine with strap) Holding knee to isolate ankle ROM; more limited with inversion. Cueing for "low and slow" stretch rather than pushing into pain. Tolerated all activities well   sitting isometric R ankle INV/EV into ball against wall 5"x10 Good ability into EV, reports "tolerable" into INV  STS focusing on symmetric WBing Mat slightly elevated; pt reports posterior R knee "catching" over location of lateral HS tendon   R step ups 4" 15x  Report of feeling weakness in the R LE and ankle "cracking"; denies pain     HOME EXERCISE  PROGRAM Last updated: 06/02/24 Access Code: [email or web address] URL: [email or web address] Date: 06/02/2024 Prepared by: [facility] - Outpatient  Rehab - [facility]  Exercises - Standing Gastroc Stretch at Counter  - 1 x daily - 5 x weekly - 2 sets - 30 sec hold - Heel Raises with Counter Support  - 1 x daily - 5 x weekly - 2 sets - 10  reps - Ankle Inversion Eversion Towel Slide  - 1 x daily - 5 x weekly - 2 sets - 10-15 reps - Hooklying Hamstring Stretch with Strap  - 1 x daily - 5 x weekly - 2 sets - 30 sec hold - Isometric Ankle Inversion at Wall  - 1 x daily - 5 x weekly - 2 sets - 10 reps - 5 sec  hold - Isometric Ankle Eversion at Wall  - 1 x daily - 5 x weekly - 2 sets - 10 reps - 5 sec  hold   PATIENT EDUCATION: Education details: detailed review and cueing for HEP; instructed on use of ice and elevation after PT sessions; HEP update, answered patient's questions  Person educated: Patient Education method: Explanation, Demonstration, Tactile cues, Verbal cues, and Handouts Education comprehension: verbalized understanding and returned demonstration    Note: Objective measures were completed at Evaluation unless otherwise noted.  DIAGNOSTIC FINDINGS:  Doppler was negative   COGNITION: Overall cognitive status: Within functional limits for tasks assessed  POSTURE:  Slight forward head  GAIT: Gait pattern: antalgic on R LE; slightly unsteady  Assistive device utilized: None Level of assistance: SBA  PATIENT SURVEYS:  DHI 32/100  VESTIBULAR ASSESSMENT:   GENERAL OBSERVATION: pt wears trifocals    OCULOMOTOR EXAM: Ocular Alignment: Cover/Uncover Test: WNL Cover/Cross Cover Test: WNL   Ocular ROM: No Limitations   Spontaneous Nystagmus: absent   Gaze-Induced Nystagmus: absent   Smooth Pursuits: intact   Saccades: intact     VESTIBULAR - OCULAR REFLEX:    Slow VOR: Normal   VOR Cancellation: Normal   Head-Impulse Test: HIT Right: positive HIT Left: positive     POSITIONAL TESTING:  Right Roll Test: negative Left Roll Test: negative   Right Loaded Dix-Hallpike: negative Left Loaded Dix-Hallpike: negative    M-CTSIB  Condition 1: Firm Surface, EO 30 Sec, Normal Sway  Condition 2: Firm Surface, EC 30 Sec, Mild and Moderate Sway  Condition 3: Foam Surface, EO 30 Sec, Mild and Moderate  Sway  Condition 4: Foam Surface, EC 6 Sec, Severe Sway                                                                                                                              TREATMENT DATE: 05/14/24  PATIENT EDUCATION: Education details: exam findings and edu on differences between vestibular loss and positional vertigo, prognosis, POC, edu on benefits of OT- pt agreeable; advised to keep a journal of which activities bring on dizziness Person educated: Patient Education method: Explanation Education comprehension: verbalized  understanding  HOME EXERCISE PROGRAM:  GOALS: Goals reviewed with patient? Yes  SHORT TERM GOALS: Target date: 06/11/2024  Patient to be independent with initial HEP. Baseline: HEP initiated Goal status: IN PROGRESS    LONG TERM GOALS: Target date: 07/16/2024  Patient to be independent with advanced HEP. Baseline: Not yet initiated  Goal status: IN PROGRESS  Patient to report 0/10 dizziness with standing vertical and horizontal VOR for 30 seconds. Baseline: Unable Goal status: IN PROGRESS  Patient to demonstrate 30 sec without LOB on M-CTSIB condition with eyes closed/foam surface in order to improve safety in environments with uneven surfaces and dim lighting. Baseline: 6 sec Goal status: IN PROGRESS  Patient to score at least 23/30 on FGA in order to decrease risk of falls. Baseline: NT Goal status: IN PROGRESS  Patient to report tolerance for standing for 1 hour without pain limiting.  Baseline: Unable to tolerate standing in grocery line Goal status: IN PROGRESS  Patient to score at least 18 points less on DHI in order to meet MCID and improve functional outcomes.  Baseline: 32 Goal status: IN PROGRESS   Patient to demonstrate R ankle AROM improved by at least 5 degrees.  Baseline: see visit 05/31/24 Goal status: INITIAL    ASSESSMENT:  CLINICAL IMPRESSION: Patient arrived to session with report of reduced pain today after  stretching. Reviewed HEP to ensure max understanding and compliance. Patient performed gentle ankle strengthening activities within tolerance. Standing activities required cues to maintain symmetric use of LEs. Step ups were performed with some apprehension however patient was pain-free with this activity. No complaints at end of session.   OBJECTIVE IMPAIRMENTS: Abnormal gait, decreased activity tolerance, decreased balance, decreased knowledge of use of DME, decreased mobility, difficulty walking, decreased ROM, dizziness, hypomobility, impaired flexibility, postural dysfunction, and pain.   ACTIVITY LIMITATIONS: carrying, lifting, bending, standing, squatting, stairs, transfers, bed mobility, bathing, reach over head, hygiene/grooming, and locomotion level  PARTICIPATION LIMITATIONS: meal prep, cleaning, laundry, driving, shopping, community activity, yard work, and church  PERSONAL FACTORS: Age, Past/current experiences, Time since onset of injury/illness/exacerbation, and 3+ comorbidities: CTS, chronic cough, R tib/fib ORIF, CLL are also affecting patient's functional outcome.   REHAB POTENTIAL: Good  CLINICAL DECISION MAKING: Evolving/moderate complexity  EVALUATION COMPLEXITY: Moderate   PLAN:  PT FREQUENCY: 2x/week  PT DURATION: 8 weeks  PLANNED INTERVENTIONS: 97164- PT Re-evaluation, 97750- Physical Performance Testing, 97110-Therapeutic exercises, 97530- Therapeutic activity, 97112- Neuromuscular re-education, 97535- Self Care, 69629- Manual therapy, [phone]- Gait training, [phone]- Canalith repositioning, [id number]- Aquatic Therapy, [phone]- Electrical stimulation (unattended), Patient/Family education, Balance training, Stair training, Taping, Dry Needling, Joint mobilization, Spinal mobilization, Vestibular training, DME instructions, Cryotherapy, and Moist heat  PLAN FOR NEXT SESSION: R ankle ROM and LE strength; multisensory balance and habituation      Thaddeus Filippo,  PT, DPT 06/07/24 10:06 AM  [facility] [address] [place], [place] [address] Phone # [phone] Fax # [phone]

## 2024-06-08 ENCOUNTER — Ambulatory Visit: Admitting: Physical Therapy

## 2024-06-08 ENCOUNTER — Encounter: Payer: Self-pay | Admitting: Physical Therapy

## 2024-06-08 DIAGNOSIS — M25571 Pain in right ankle and joints of right foot: Secondary | ICD-10-CM

## 2024-06-08 DIAGNOSIS — R2681 Unsteadiness on feet: Secondary | ICD-10-CM

## 2024-06-08 DIAGNOSIS — R42 Dizziness and giddiness: Secondary | ICD-10-CM | POA: Diagnosis not present

## 2024-06-08 DIAGNOSIS — R262 Difficulty in walking, not elsewhere classified: Secondary | ICD-10-CM

## 2024-06-10 NOTE — Therapy (Signed)
 OUTPATIENT PHYSICAL THERAPY VESTIBULAR TREATMENT     Patient Name: Paula [name] MRN: [id number] DOB:04/17/1951, 73 y.o., female Today's Date: 06/11/2024  END OF SESSION:  PT End of Session - 06/11/24 0843     Visit Number 5    Number of Visits 17    Date for PT Re-Evaluation 07/16/24    Authorization Type Medicare/[employer]    PT Start Time 0801    PT Stop Time 0843    PT Time Calculation (min) 42 min    Equipment Utilized During Treatment Gait belt    Activity Tolerance Patient tolerated treatment well    Behavior During Therapy [facility] for tasks assessed/performed              Past Medical History:  Diagnosis Date   CARPAL TUNNEL SYNDROME, MILD 12/05/2008   Chronic cough - seeing pulmonologist in [place], [facility] 03/14/2014   HSV 03/01/2010   Leg skin lesion, left 01/30/2015   precancerous cells per patient   OSTEOPENIA 12/05/2008   PARESTHESIA 09/01/2009   Waldenstroms macroglobulinemia - followed by Dr. Keturah Peer, [place], [facility] 03/15/2014   Past Surgical History:  Procedure Laterality Date   CHOLECYSTECTOMY  02/2017   LEG SKIN LESION  BIOPSY / EXCISION Left 01/30/2015   Dr Mariah Shines Gould-precancerous cells per patient   ORIF TIBIA & FIBULA FRACTURES Right 1988   TONSILLECTOMY AND ADENOIDECTOMY  1958   Patient Active Problem List   Diagnosis Date Noted   Synovial cyst of wrist, right 10/18/2022   Arthritis of right ankle 11/28/2021   Patellofemoral arthritis of right knee 01/18/2020   Cold sore 01/18/2020   CLL (chronic lymphocytic leukemia) (HCC) 03/16/2018   Vaccine contraindicated - live vaccines contraindicated 04/20/2014   Waldenstroms macroglobulinemia - followed by Dr. Keturah Peer, [place], [facility] 03/15/2014   Chronic cough - seeing pulmonologist in [place], [facility] 03/14/2014   Disorder of bone and cartilage 12/05/2008    PCP: Aida House, MD  REFERRING PROVIDER: Syliva Even, MD  REFERRING  DIAG: [phone] (ICD-10-CM) - Chronic pain of right lower extremity R42 (ICD-10-CM) - Dizziness  THERAPY DIAG:  Dizziness and giddiness  Unsteadiness on feet  Pain in right ankle and joints of right foot  Difficulty in walking, not elsewhere classified  ONSET DATE: 1 month  Rationale for Evaluation and Treatment: Rehabilitation  SUBJECTIVE:   SUBJECTIVE STATEMENT: I was walking around in [employer] on Wednesday and my legs were so sore. Last night, the L proximal medial calf was really sore, still sore now. Have been doing the exercises but not so much with the ankle turns. Reports noticing some dizziness with a quick diagonal movement since last session.    Pt accompanied by: self  PERTINENT HISTORY: CTS, chronic cough, R tib/fib ORIF, CLL  PAIN:  Are you having pain? Yes: NPRS scale: 3/10 Pain location: L proximal medial calf  Pain description: constant sore Aggravating factors: bending the ankle, walking  Relieving factors: hokas, shots, massage  PRECAUTIONS: Fall  RED FLAGS: None   WEIGHT BEARING RESTRICTIONS: No  FALLS: Has patient fallen in last 6 months? No  LIVING ENVIRONMENT: Lives with: lives with their spouse Lives in: House/apartment Stairs: 3 story home; 3-7 steps to enter Has following equipment at home: Single point cane and Environmental consultant - 2 wheeled  PLOF: Independent, Vocation/Vocational requirements: retired, and Leisure: pilates   PATIENT GOALS: improve dizziness and R ankle pain  OBJECTIVE:    TODAY'S TREATMENT: 06/11/24 Activity Comments  sitting anterior canal  habituation (EO and EC) 10 No dizziness EO or EC  standing bending to place cone on floor, side step over it Also performed standing bending to place cone on floor, forward step over it No dizziness; CGA; mild instability with R LE leading.   Standing D2 flexion 5x each direction Good speed; no dizziness   lateral wt shifting To increase comfort with R wt shift  alt toe taps on step  Good ability to wt shift; able to progress to double tap  R DF stretch with foot on step Provided adjustment for max stretch. Good tolerance. Instructed on performing this in the AM to get stretched out     PATIENT EDUCATION: Education details: encouraged improved compliance with HEP; answered pt's questions; discouraged pt to avoid R LE Wbing (patient standing in L SLS during session today to avoid R Wbing); edu on arthritis-related stiffness and need for continuous movement, HEP Person educated: Patient Education method: Explanation Education comprehension: verbalized understanding    HOME EXERCISE PROGRAM Access Code: [email or web address] URL: [email or web address] Date: 06/11/2024 Prepared by: [facility] - Outpatient  Rehab - [facility]  Exercises - Standing Gastroc Stretch at Counter  - 1 x daily - 5 x weekly - 2 sets - 30 sec hold - Heel Raises with Counter Support  - 1 x daily - 5 x weekly - 2 sets - 10 reps - Ankle Inversion Eversion Towel Slide  - 1 x daily - 5 x weekly - 2 sets - 10-15 reps - Hooklying Hamstring Stretch with Strap  - 1 x daily - 5 x weekly - 2 sets - 30 sec hold - Isometric Ankle Inversion at Wall  - 1 x daily - 5 x weekly - 2 sets - 10 reps - 5 sec  hold - Isometric Ankle Eversion at Wall  - 1 x daily - 5 x weekly - 2 sets - 10 reps - 5 sec  hold - Standing Ankle Dorsiflexion Stretch on Chair  - 1 x daily - 5 x weekly - 2 sets - 10 reps - 5 sec hold    Note: Objective measures were completed at Evaluation unless otherwise noted.  DIAGNOSTIC FINDINGS:  Doppler was negative   COGNITION: Overall cognitive status: Within functional limits for tasks assessed  POSTURE:  Slight forward head  GAIT: Gait pattern: antalgic on R LE; slightly unsteady  Assistive device utilized: None Level of assistance: SBA  PATIENT SURVEYS:  DHI 32/100  VESTIBULAR ASSESSMENT:   GENERAL OBSERVATION: pt wears trifocals    OCULOMOTOR EXAM: Ocular Alignment:  Cover/Uncover Test: WNL Cover/Cross Cover Test: WNL   Ocular ROM: No Limitations   Spontaneous Nystagmus: absent   Gaze-Induced Nystagmus: absent   Smooth Pursuits: intact   Saccades: intact     VESTIBULAR - OCULAR REFLEX:    Slow VOR: Normal   VOR Cancellation: Normal   Head-Impulse Test: HIT Right: positive HIT Left: positive     POSITIONAL TESTING:  Right Roll Test: negative Left Roll Test: negative   Right Loaded Dix-Hallpike: negative Left Loaded Dix-Hallpike: negative    M-CTSIB  Condition 1: Firm Surface, EO 30 Sec, Normal Sway  Condition 2: Firm Surface, EC 30 Sec, Mild and Moderate Sway  Condition 3: Foam Surface, EO 30 Sec, Mild and Moderate Sway  Condition 4: Foam Surface, EC 6 Sec, Severe Sway  TREATMENT DATE: 05/14/24  PATIENT EDUCATION: Education details: exam findings and edu on differences between vestibular loss and positional vertigo, prognosis, POC, edu on benefits of OT- pt agreeable; advised to keep a journal of which activities bring on dizziness Person educated: Patient Education method: Explanation Education comprehension: verbalized understanding  HOME EXERCISE PROGRAM:  GOALS: Goals reviewed with patient? Yes  SHORT TERM GOALS: Target date: 06/11/2024  Patient to be independent with initial HEP. Baseline: HEP initiated Goal status: MET    LONG TERM GOALS: Target date: 07/16/2024  Patient to be independent with advanced HEP. Baseline: Not yet initiated  Goal status: IN PROGRESS  Patient to report 0/10 dizziness with standing vertical and horizontal VOR for 30 seconds. Baseline: Unable Goal status: IN PROGRESS  Patient to demonstrate 30 sec without LOB on M-CTSIB condition with eyes closed/foam surface in order to improve safety in environments with uneven surfaces and dim lighting. Baseline: 6 sec Goal  status: IN PROGRESS  Patient to score at least 23/30 on FGA in order to decrease risk of falls. Baseline: NT Goal status: IN PROGRESS  Patient to report tolerance for standing for 1 hour without pain limiting.  Baseline: Unable to tolerate standing in grocery line Goal status: IN PROGRESS  Patient to score at least 18 points less on DHI in order to meet MCID and improve functional outcomes.  Baseline: 32 Goal status: IN PROGRESS   Patient to demonstrate R ankle AROM improved by at least 5 degrees.  Baseline: see visit 05/31/24 Goal status: INITIAL    ASSESSMENT:  CLINICAL IMPRESSION: Patient arrived to session with report of L proximal medial calf after long walk at [employer]. Observed patient standing in L SLS during session to avoid R LE WBing; this is likely contributing to L LE pain. She also reported some dizziness with quick diagonal movements. Trialed several bending and diagonal reaching activities but could not reproduce symptoms today. Standing exercises focused on improving comfort with weight shift and improving ankle mobility. No complaints at end of session.   OBJECTIVE IMPAIRMENTS: Abnormal gait, decreased activity tolerance, decreased balance, decreased knowledge of use of DME, decreased mobility, difficulty walking, decreased ROM, dizziness, hypomobility, impaired flexibility, postural dysfunction, and pain.   ACTIVITY LIMITATIONS: carrying, lifting, bending, standing, squatting, stairs, transfers, bed mobility, bathing, reach over head, hygiene/grooming, and locomotion level  PARTICIPATION LIMITATIONS: meal prep, cleaning, laundry, driving, shopping, community activity, yard work, and church  PERSONAL FACTORS: Age, Past/current experiences, Time since onset of injury/illness/exacerbation, and 3+ comorbidities: CTS, chronic cough, R tib/fib ORIF, CLL are also affecting patient's functional outcome.   REHAB POTENTIAL: Good  CLINICAL DECISION MAKING: Evolving/moderate  complexity  EVALUATION COMPLEXITY: Moderate   PLAN:  PT FREQUENCY: 2x/week  PT DURATION: 8 weeks  PLANNED INTERVENTIONS: 97164- PT Re-evaluation, 97750- Physical Performance Testing, 97110-Therapeutic exercises, 97530- Therapeutic activity, 97112- Neuromuscular re-education, 97535- Self Care, 19147- Manual therapy, [phone]- Gait training, [phone]- Canalith repositioning, [id number]- Aquatic Therapy, [phone]- Electrical stimulation (unattended), Patient/Family education, Balance training, Stair training, Taping, Dry Needling, Joint mobilization, Spinal mobilization, Vestibular training, DME instructions, Cryotherapy, and Moist heat  PLAN FOR NEXT SESSION: R ankle ROM and LE strength; multisensory balance and habituation      Thaddeus Filippo, PT, DPT 06/11/24 8:45 AM  [facility] at [facility] [address] [place], [place] [address] Phone # [phone] Fax # [phone]

## 2024-06-11 ENCOUNTER — Encounter: Payer: Self-pay | Admitting: Physical Therapy

## 2024-06-11 ENCOUNTER — Ambulatory Visit: Admitting: Physical Therapy

## 2024-06-11 DIAGNOSIS — R2681 Unsteadiness on feet: Secondary | ICD-10-CM

## 2024-06-11 DIAGNOSIS — R42 Dizziness and giddiness: Secondary | ICD-10-CM | POA: Diagnosis not present

## 2024-06-11 DIAGNOSIS — R262 Difficulty in walking, not elsewhere classified: Secondary | ICD-10-CM

## 2024-06-11 DIAGNOSIS — M25571 Pain in right ankle and joints of right foot: Secondary | ICD-10-CM

## 2024-06-14 ENCOUNTER — Other Ambulatory Visit: Payer: Self-pay

## 2024-06-14 DIAGNOSIS — Z006 Encounter for examination for normal comparison and control in clinical research program: Secondary | ICD-10-CM

## 2024-06-14 NOTE — Therapy (Signed)
 OUTPATIENT PHYSICAL THERAPY VESTIBULAR TREATMENT     Patient Name: Paula [name] MRN: [id number] DOB:[birth year], 73 y.o., female Today's Date: 06/15/2024  END OF SESSION:  PT End of Session - 06/15/24 0835     Visit Number 6    Number of Visits 17    Date for PT Re-Evaluation 07/16/24    Authorization Type Medicare/Aetna    PT Start Time 0801    PT Stop Time 0839    PT Time Calculation (min) 38 min    Equipment Utilized During Treatment Gait belt    Activity Tolerance Patient tolerated treatment well    Behavior During Therapy [facility] for tasks assessed/performed               Past Medical History:  Diagnosis Date   CARPAL TUNNEL SYNDROME, MILD 12/05/2008   Chronic cough - seeing pulmonologist in [place], [facility] 03/14/2014   HSV 03/01/2010   Leg skin lesion, left 01/30/2015   precancerous cells per patient   OSTEOPENIA 12/05/2008   PARESTHESIA 09/01/2009   Waldenstroms macroglobulinemia - followed by Dr. Keturah Peer, [place], [facility] 03/15/2014   Past Surgical History:  Procedure Laterality Date   CHOLECYSTECTOMY  02/2017   LEG SKIN LESION  BIOPSY / EXCISION Left 01/30/2015   Dr Mariah Shines Gould-precancerous cells per patient   ORIF TIBIA & FIBULA FRACTURES Right 1988   TONSILLECTOMY AND ADENOIDECTOMY  1958   Patient Active Problem List   Diagnosis Date Noted   Synovial cyst of wrist, right 10/18/2022   Arthritis of right ankle 11/28/2021   Patellofemoral arthritis of right knee 01/18/2020   Cold sore 01/18/2020   CLL (chronic lymphocytic leukemia) (HCC) 03/16/2018   Vaccine contraindicated - live vaccines contraindicated 04/20/2014   Waldenstroms macroglobulinemia - followed by Dr. Verdel Gitelman, [facility] 03/15/2014   Chronic cough - seeing pulmonologist in [place], [facility] 03/14/2014   Disorder of bone and cartilage 12/05/2008    PCP: Aida House, MD  REFERRING PROVIDER: Syliva Even,  MD  REFERRING DIAG: [phone] (ICD-10-CM) - Chronic pain of right lower extremity R42 (ICD-10-CM) - Dizziness  THERAPY DIAG:  Dizziness and giddiness  Unsteadiness on feet  Pain in right ankle and joints of right foot  Difficulty in walking, not elsewhere classified  ONSET DATE: 1 month  Rationale for Evaluation and Treatment: Rehabilitation  SUBJECTIVE:   SUBJECTIVE STATEMENT: Reports that the ankle is good. Have been doing the exercises- the one with the bending of the ankle is helpful. I think the exercises are helping me walk better. Reports that she gets tired with the exercises. Reports that she had some ankle soreness after going to [employer].    Pt accompanied by: self  PERTINENT HISTORY: CTS, chronic cough, R tib/fib ORIF, CLL  PAIN:  Are you having pain? Yes: NPRS scale: 1/10 Pain location: R posterior knee and R anterior ankle Pain description: ache Aggravating factors: bending the ankle, walking  Relieving factors: hokas, shots, massage  PRECAUTIONS: Fall  RED FLAGS: None   WEIGHT BEARING RESTRICTIONS: No  FALLS: Has patient fallen in last 6 months? No  LIVING ENVIRONMENT: Lives with: lives with their spouse Lives in: House/apartment Stairs: 3 story home; 3-7 steps to enter Has following equipment at home: Single point cane and Environmental consultant - 2 wheeled  PLOF: Independent, Vocation/Vocational requirements: retired, and Leisure: pilates   PATIENT GOALS: improve dizziness and R ankle pain  OBJECTIVE:     TODAY'S TREATMENT: 06/15/24 Activity Comments  fwd/back stepping  Occasional crossing over/scissoring; required frequent UE support   romberg EC + head turns/nods 2x30 each  Mild-mod sway and requires UE support occasionally. Improved performance after practice   ankle INV/EV with yellow TB 15x each  Demonstrated 2 ways of performing this activity; provided cueing to discourage compensation with hip    PATIENT EDUCATION: Education details:  answered pt's questions on how long to continue HEP for max benefit ; HEP update and consolidation for max benefit; continued encouragement of performing HEP and communicating with therapist about her tolerance for activities; discussed pt's improved tolerance for stairs, getting out of bathtub Person educated: Patient Education method: Explanation Education comprehension: verbalized understanding    HOME EXERCISE PROGRAM Access Code: [email or web address] URL: [email or web address] Date: 06/15/2024 Prepared by: [facility] - Outpatient  Rehab - [facility]  Exercises - Standing Gastroc Stretch at Counter  - 1 x daily - 5 x weekly - 2 sets - 30 sec hold - Heel Raises with Counter Support  - 1 x daily - 5 x weekly - 2 sets - 10 reps - Ankle Inversion Eversion Towel Slide  - 1 x daily - 5 x weekly - 2 sets - 10-15 reps - Hooklying Hamstring Stretch with Strap  - 1 x daily - 5 x weekly - 2 sets - 30 sec hold - Standing Ankle Dorsiflexion Stretch on Chair  - 1 x daily - 5 x weekly - 2 sets - 10 reps - 5 sec hold - Alternating Step Forward with Support  - 1 x daily - 5 x weekly - 2 sets - 10 reps - Ankle Eversion with Resistance  - 1 x daily - 5 x weekly - 2 sets - 10 reps - Seated Ankle Inversion with Resistance (Mirrored)  - 1 x daily - 5 x weekly - 2 sets - 10 reps    Note: Objective measures were completed at Evaluation unless otherwise noted.  DIAGNOSTIC FINDINGS:  Doppler was negative   COGNITION: Overall cognitive status: Within functional limits for tasks assessed  POSTURE:  Slight forward head  GAIT: Gait pattern: antalgic on R LE; slightly unsteady  Assistive device utilized: None Level of assistance: SBA  PATIENT SURVEYS:  DHI 32/100  VESTIBULAR ASSESSMENT:   GENERAL OBSERVATION: pt wears trifocals    OCULOMOTOR EXAM: Ocular Alignment: Cover/Uncover Test: WNL Cover/Cross Cover Test: WNL   Ocular ROM: No Limitations   Spontaneous Nystagmus:  absent   Gaze-Induced Nystagmus: absent   Smooth Pursuits: intact   Saccades: intact     VESTIBULAR - OCULAR REFLEX:    Slow VOR: Normal   VOR Cancellation: Normal   Head-Impulse Test: HIT Right: positive HIT Left: positive     POSITIONAL TESTING:  Right Roll Test: negative Left Roll Test: negative   Right Loaded Dix-Hallpike: negative Left Loaded Dix-Hallpike: negative    M-CTSIB  Condition 1: Firm Surface, EO 30 Sec, Normal Sway  Condition 2: Firm Surface, EC 30 Sec, Mild and Moderate Sway  Condition 3: Foam Surface, EO 30 Sec, Mild and Moderate Sway  Condition 4: Foam Surface, EC 6 Sec, Severe Sway  TREATMENT DATE: 05/14/24  PATIENT EDUCATION: Education details: exam findings and edu on differences between vestibular loss and positional vertigo, prognosis, POC, edu on benefits of OT- pt agreeable; advised to keep a journal of which activities bring on dizziness Person educated: Patient Education method: Explanation Education comprehension: verbalized understanding  HOME EXERCISE PROGRAM:  GOALS: Goals reviewed with patient? Yes  SHORT TERM GOALS: Target date: 06/11/2024  Patient to be independent with initial HEP. Baseline: HEP initiated Goal status: MET    LONG TERM GOALS: Target date: 07/16/2024  Patient to be independent with advanced HEP. Baseline: Not yet initiated  Goal status: IN PROGRESS  Patient to report 0/10 dizziness with standing vertical and horizontal VOR for 30 seconds. Baseline: Unable Goal status: IN PROGRESS  Patient to demonstrate 30 sec without LOB on M-CTSIB condition with eyes closed/foam surface in order to improve safety in environments with uneven surfaces and dim lighting. Baseline: 6 sec Goal status: IN PROGRESS  Patient to score at least 23/30 on FGA in order to decrease risk of falls. Baseline:  NT Goal status: IN PROGRESS  Patient to report tolerance for standing for 1 hour without pain limiting.  Baseline: Unable to tolerate standing in grocery line Goal status: IN PROGRESS  Patient to score at least 18 points less on DHI in order to meet MCID and improve functional outcomes.  Baseline: 32 Goal status: IN PROGRESS   Patient to demonstrate R ankle AROM improved by at least 5 degrees.  Baseline: see visit 05/31/24 Goal status: INITIAL    ASSESSMENT:  CLINICAL IMPRESSION: Patient arrived to session with report of benefit from HEP. Standing dynamic balance activities worked on encouraging R LE WBing and challenging multisensory balance. Patient requires cues for wider BOS and symmetrical step length. EC balance seemed to improve with repetition. Discussed pt's tolerance for current HEP and updated with concentric ankle strengthening which was well-tolerated today. Patient reported understanding of edu provided; no complaints at end of session.  OBJECTIVE IMPAIRMENTS: Abnormal gait, decreased activity tolerance, decreased balance, decreased knowledge of use of DME, decreased mobility, difficulty walking, decreased ROM, dizziness, hypomobility, impaired flexibility, postural dysfunction, and pain.   ACTIVITY LIMITATIONS: carrying, lifting, bending, standing, squatting, stairs, transfers, bed mobility, bathing, reach over head, hygiene/grooming, and locomotion level  PARTICIPATION LIMITATIONS: meal prep, cleaning, laundry, driving, shopping, community activity, yard work, and church  PERSONAL FACTORS: Age, Past/current experiences, Time since onset of injury/illness/exacerbation, and 3+ comorbidities: CTS, chronic cough, R tib/fib ORIF, CLL are also affecting patient's functional outcome.   REHAB POTENTIAL: Good  CLINICAL DECISION MAKING: Evolving/moderate complexity  EVALUATION COMPLEXITY: Moderate   PLAN:  PT FREQUENCY: 2x/week  PT DURATION: 8 weeks  PLANNED  INTERVENTIONS: 97164- PT Re-evaluation, 97750- Physical Performance Testing, 97110-Therapeutic exercises, 97530- Therapeutic activity, 97112- Neuromuscular re-education, 97535- Self Care, 16109- Manual therapy, [phone]- Gait training, [phone]- Canalith repositioning, [id number]- Aquatic Therapy, [phone]- Electrical stimulation (unattended), Patient/Family education, Balance training, Stair training, Taping, Dry Needling, Joint mobilization, Spinal mobilization, Vestibular training, DME instructions, Cryotherapy, and Moist heat  PLAN FOR NEXT SESSION: R ankle ROM and LE strength; multisensory balance and habituation      Thaddeus Filippo, PT, DPT 06/15/24 8:40 AM   Outpatient Rehab at [facility] [address] [place], [place] [address] Phone # [phone] Fax # [phone]

## 2024-06-15 ENCOUNTER — Ambulatory Visit: Admitting: Physical Therapy

## 2024-06-15 ENCOUNTER — Ambulatory Visit: Admitting: Occupational Therapy

## 2024-06-15 ENCOUNTER — Encounter: Payer: Self-pay | Admitting: Physical Therapy

## 2024-06-15 DIAGNOSIS — R2681 Unsteadiness on feet: Secondary | ICD-10-CM

## 2024-06-15 DIAGNOSIS — R42 Dizziness and giddiness: Secondary | ICD-10-CM | POA: Diagnosis not present

## 2024-06-15 DIAGNOSIS — M25571 Pain in right ankle and joints of right foot: Secondary | ICD-10-CM

## 2024-06-15 DIAGNOSIS — R262 Difficulty in walking, not elsewhere classified: Secondary | ICD-10-CM

## 2024-06-17 NOTE — Therapy (Signed)
 OUTPATIENT PHYSICAL THERAPY VESTIBULAR TREATMENT     Patient Name: Paula [name] MRN: [id number] DOB:February 02, 1951, 73 y.o., female Today's Date: 06/18/2024  END OF SESSION:  PT End of Session - 06/18/24 0837     Visit Number 7    Number of Visits 17    Date for PT Re-Evaluation 07/16/24    Authorization Type Medicare/Aetna    PT Start Time 0801    PT Stop Time 0840    PT Time Calculation (min) 39 min    Activity Tolerance Patient tolerated treatment well    Behavior During Therapy [facility] for tasks assessed/performed                Past Medical History:  Diagnosis Date   CARPAL TUNNEL SYNDROME, MILD 12/05/2008   Chronic cough - seeing pulmonologist in [place], [facility] 03/14/2014   HSV 03/01/2010   Leg skin lesion, left 01/30/2015   precancerous cells per patient   OSTEOPENIA 12/05/2008   PARESTHESIA 09/01/2009   Waldenstroms macroglobulinemia - followed by Dr. Eyvonne, [place], [facility] 03/15/2014   Past Surgical History:  Procedure Laterality Date   CHOLECYSTECTOMY  02/2017   LEG SKIN LESION  BIOPSY / EXCISION Left 01/30/2015   Dr Darice Gould-precancerous cells per patient   ORIF TIBIA & FIBULA FRACTURES Right 1988   TONSILLECTOMY AND ADENOIDECTOMY  1958   Patient Active Problem List   Diagnosis Date Noted   Synovial cyst of wrist, right 10/18/2022   Arthritis of right ankle 11/28/2021   Patellofemoral arthritis of right knee 01/18/2020   Cold sore 01/18/2020   CLL (chronic lymphocytic leukemia) (HCC) 03/16/2018   Vaccine contraindicated - live vaccines contraindicated 04/20/2014   Waldenstroms macroglobulinemia - followed by Dr. Eyvonne [place], [facility] 03/15/2014   Chronic cough - seeing pulmonologist in [place], [facility] 03/14/2014   Disorder of bone and cartilage 12/05/2008    PCP: Ozell Heron HERO, MD  REFERRING PROVIDER: Joane Artist RAMAN, MD  REFERRING DIAG: [id number] (ICD-10-CM) - Chronic  pain of right lower extremity R42 (ICD-10-CM) - Dizziness  THERAPY DIAG:  Dizziness and giddiness  Unsteadiness on feet  Pain in right ankle and joints of right foot  Difficulty in walking, not elsewhere classified  ONSET DATE: 1 month  Rationale for Evaluation and Treatment: Rehabilitation  SUBJECTIVE:   SUBJECTIVE STATEMENT: This morning I'm pretty good. Wednesday I didn't move long enough before moving and the R back of the leg and ankle were sore until 1 pm. Yesterday she got a cramp in the R back of the leg and toes.    Pt accompanied by: self  PERTINENT HISTORY: CTS, chronic cough, R tib/fib ORIF, CLL  PAIN:  Are you having pain? Yes: NPRS scale: 1/10 Pain location: R posterior knee and R anterior ankle Pain description: ache Aggravating factors: bending the ankle, walking  Relieving factors: hokas, shots, massage  PRECAUTIONS: Fall  RED FLAGS: None   WEIGHT BEARING RESTRICTIONS: No  FALLS: Has patient fallen in last 6 months? No  LIVING ENVIRONMENT: Lives with: lives with their spouse Lives in: House/apartment Stairs: 3 story home; 3-7 steps to enter Has following equipment at home: Single point cane and Environmental consultant - 2 wheeled  PLOF: Independent, Vocation/Vocational requirements: retired, and Leisure: pilates   PATIENT GOALS: improve dizziness and R ankle pain  OBJECTIVE:    TODAY'S TREATMENT: 06/18/24 Activity Comments  Stretching on mat, focusing on R LE for 30 each: HS stretch with strap Fig 4 KTOS Mod thomas with  strap  Cueing for positioning and proper line of pull. Advised to avoid pushing into pain   Sitting R ankle stretching self-PROM: Ankle inversion and eversion Pain free  Standing R ankle toe flexion and toe extension stretch with chair support  5x10 with encouragement to avoid pushing into pain. Pt tolerated well   ankle INV/EV with loop yellow TB  Pt reports improved success with this version     HOME EXERCISE PROGRAM Last  updated: 06/18/24 Access Code: [email or web address] URL: [email or web address] Date: 06/18/2024 Prepared by: [name] - Outpatient  Rehab - [facility]  Exercises - Standing Gastroc Stretch at Counter  - 1 x daily - 5 x weekly - 2 sets - 30 sec hold - Heel Raises with Counter Support  - 1 x daily - 5 x weekly - 2 sets - 10 reps - Hooklying Hamstring Stretch with Strap  - 1 x daily - 5 x weekly - 2 sets - 30 sec hold - Standing Ankle Dorsiflexion Stretch on Chair  - 1 x daily - 5 x weekly - 2 sets - 10 reps - 5 sec hold - Alternating Step Forward with Support  - 1 x daily - 5 x weekly - 2 sets - 10 reps - Ankle Eversion with Resistance  - 1 x daily - 5 x weekly - 2 sets - 10 reps - Seated Ankle Inversion with Resistance (Mirrored)  - 1 x daily - 5 x weekly - 2 sets - 10 reps - Modified Thomas Stretch  - 1 x daily - 5 x weekly - 2 sets - 30 sec  hold - Seated Ankle Inversion Eversion PROM  - 1 x daily - 5 x weekly - 2 sets - 10 reps - 5 sec hold - Standing Toe Dorsiflexion Stretch  - 1 x daily - 5 x weekly - 2 sets - 5 reps - 10 sec hold - Standing Ankle Dorsiflexor Toe Extensor Stretch  - 1 x daily - 5 x weekly - 2 sets - 5 reps - 10 sec hold     PATIENT EDUCATION: Education details: edu on how to address muscle cramping when it comes on; HEP update; talked about weaning down visits to 1x/week in the future Person educated: Patient Education method: Explanation, Demonstration, Tactile cues, Verbal cues, and Handouts Education comprehension: verbalized understanding, returned demonstration, verbal cues required, and tactile cues required    Note: Objective measures were completed at Evaluation unless otherwise noted.  DIAGNOSTIC FINDINGS:  Doppler was negative   COGNITION: Overall cognitive status: Within functional limits for tasks assessed  POSTURE:  Slight forward head  GAIT: Gait pattern: antalgic on R LE; slightly unsteady  Assistive device utilized: None Level  of assistance: SBA  PATIENT SURVEYS:  DHI 32/100  VESTIBULAR ASSESSMENT:   GENERAL OBSERVATION: pt wears trifocals    OCULOMOTOR EXAM: Ocular Alignment: Cover/Uncover Test: WNL Cover/Cross Cover Test: WNL   Ocular ROM: No Limitations   Spontaneous Nystagmus: absent   Gaze-Induced Nystagmus: absent   Smooth Pursuits: intact   Saccades: intact     VESTIBULAR - OCULAR REFLEX:    Slow VOR: Normal   VOR Cancellation: Normal   Head-Impulse Test: HIT Right: positive HIT Left: positive     POSITIONAL TESTING:  Right Roll Test: negative Left Roll Test: negative   Right Loaded Dix-Hallpike: negative Left Loaded Dix-Hallpike: negative    M-CTSIB  Condition 1: Firm Surface, EO 30 Sec, Normal Sway  Condition 2: Firm Surface, EC 30 Sec, Mild and Moderate  Sway  Condition 3: Foam Surface, EO 30 Sec, Mild and Moderate Sway  Condition 4: Foam Surface, EC 6 Sec, Severe Sway                                                                                                                              TREATMENT DATE: 05/14/24  PATIENT EDUCATION: Education details: exam findings and edu on differences between vestibular loss and positional vertigo, prognosis, POC, edu on benefits of OT- pt agreeable; advised to keep a journal of which activities bring on dizziness Person educated: Patient Education method: Explanation Education comprehension: verbalized understanding  HOME EXERCISE PROGRAM:  GOALS: Goals reviewed with patient? Yes  SHORT TERM GOALS: Target date: 06/11/2024  Patient to be independent with initial HEP. Baseline: HEP initiated Goal status: MET    LONG TERM GOALS: Target date: 07/16/2024  Patient to be independent with advanced HEP. Baseline: Not yet initiated  Goal status: IN PROGRESS  Patient to report 0/10 dizziness with standing vertical and horizontal VOR for 30 seconds. Baseline: Unable Goal status: IN PROGRESS  Patient to demonstrate 30 sec without LOB  on M-CTSIB condition with eyes closed/foam surface in order to improve safety in environments with uneven surfaces and dim lighting. Baseline: 6 sec Goal status: IN PROGRESS  Patient to score at least 23/30 on FGA in order to decrease risk of falls. Baseline: NT Goal status: IN PROGRESS  Patient to report tolerance for standing for 1 hour without pain limiting.  Baseline: Unable to tolerate standing in grocery line Goal status: IN PROGRESS  Patient to score at least 18 points less on DHI in order to meet MCID and improve functional outcomes.  Baseline: 32 Goal status: IN PROGRESS   Patient to demonstrate R ankle AROM improved by at least 5 degrees.  Baseline: see visit 05/31/24 Goal status: INITIAL    ASSESSMENT:  CLINICAL IMPRESSION: Patient arrived to session with report of some occurrences muscle cramping in the R LE. Session focused on gentle LE stretching to find areas of most tension. Patient tolerated all of these activities well with cueing. Reported improved flexibility in the ankle. HEP was adjusted for max benefit. Patient reported understanding and without complaints at end of session.   OBJECTIVE IMPAIRMENTS: Abnormal gait, decreased activity tolerance, decreased balance, decreased knowledge of use of DME, decreased mobility, difficulty walking, decreased ROM, dizziness, hypomobility, impaired flexibility, postural dysfunction, and pain.   ACTIVITY LIMITATIONS: carrying, lifting, bending, standing, squatting, stairs, transfers, bed mobility, bathing, reach over head, hygiene/grooming, and locomotion level  PARTICIPATION LIMITATIONS: meal prep, cleaning, laundry, driving, shopping, community activity, yard work, and church  PERSONAL FACTORS: Age, Past/current experiences, Time since onset of injury/illness/exacerbation, and 3+ comorbidities: CTS, chronic cough, R tib/fib ORIF, CLL are also affecting patient's functional outcome.   REHAB POTENTIAL: Good  CLINICAL DECISION  MAKING: Evolving/moderate complexity  EVALUATION COMPLEXITY: Moderate   PLAN:  PT FREQUENCY: 2x/week  PT DURATION: 8 weeks  PLANNED INTERVENTIONS:  02835- PT Re-evaluation, 97750- Physical Performance Testing, 97110-Therapeutic exercises, 97530- Therapeutic activity, [id number]- Neuromuscular re-education, [phone]- Self Care, 02859- Manual therapy, [phone]- Gait training, [phone]- Canalith repositioning, [phone]- Aquatic Therapy, [phone]- Electrical stimulation (unattended), Patient/Family education, Balance training, Stair training, Taping, Dry Needling, Joint mobilization, Spinal mobilization, Vestibular training, DME instructions, Cryotherapy, and Moist heat  PLAN FOR NEXT SESSION: R ankle ROM and LE strength; multisensory balance and habituation      Louana Terrilyn Christians, PT, DPT 06/18/24 8:40 AM  [place] [facility] at [facility] [address] [place], [place] [address] Phone # [phone] Fax # [phone]

## 2024-06-18 ENCOUNTER — Ambulatory Visit: Admitting: Physical Therapy

## 2024-06-18 ENCOUNTER — Ambulatory Visit: Admitting: Occupational Therapy

## 2024-06-18 ENCOUNTER — Encounter: Payer: Self-pay | Admitting: Physical Therapy

## 2024-06-18 DIAGNOSIS — R2681 Unsteadiness on feet: Secondary | ICD-10-CM

## 2024-06-18 DIAGNOSIS — R262 Difficulty in walking, not elsewhere classified: Secondary | ICD-10-CM

## 2024-06-18 DIAGNOSIS — R42 Dizziness and giddiness: Secondary | ICD-10-CM

## 2024-06-18 DIAGNOSIS — M25531 Pain in right wrist: Secondary | ICD-10-CM

## 2024-06-18 DIAGNOSIS — M25571 Pain in right ankle and joints of right foot: Secondary | ICD-10-CM
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
## 2024-06-18 NOTE — Patient Instructions (Signed)
 Paula [name]

## 2024-06-21 NOTE — Therapy (Signed)
 OUTPATIENT PHYSICAL THERAPY VESTIBULAR TREATMENT     Patient Name: Paula [name] MRN: [id number] DOB:December 29, 1951, 73 y.o., female Today's Date: 06/22/2024  END OF SESSION:  PT End of Session - 06/22/24 0840     Visit Number 8    Number of Visits 17    Date for PT Re-Evaluation 07/16/24    Authorization Type Medicare/[employer]    PT Start Time 0801    PT Stop Time 0840    PT Time Calculation (min) 39 min    Activity Tolerance Patient tolerated treatment well    Behavior During Therapy [facility] for tasks assessed/performed                 Past Medical History:  Diagnosis Date   CARPAL TUNNEL SYNDROME, MILD 12/05/2008   Chronic cough - seeing pulmonologist in [place], [facility] 03/14/2014   HSV 03/01/2010   Leg skin lesion, left 01/30/2015   precancerous cells per patient   OSTEOPENIA 12/05/2008   PARESTHESIA 09/01/2009   Waldenstroms macroglobulinemia - followed by Dr. Eyvonne, [place], [facility] 03/15/2014   Past Surgical History:  Procedure Laterality Date   CHOLECYSTECTOMY  02/2017   LEG SKIN LESION  BIOPSY / EXCISION Left 01/30/2015   Dr Darice Gould-precancerous cells per patient   ORIF TIBIA & FIBULA FRACTURES Right 1988   TONSILLECTOMY AND ADENOIDECTOMY  1958   Patient Active Problem List   Diagnosis Date Noted   Synovial cyst of wrist, right 10/18/2022   Arthritis of right ankle 11/28/2021   Patellofemoral arthritis of right knee 01/18/2020   Cold sore 01/18/2020   CLL (chronic lymphocytic leukemia) (HCC) 03/16/2018   Vaccine contraindicated - live vaccines contraindicated 04/20/2014   Waldenstroms macroglobulinemia - followed by Dr. Eyvonne [place], [facility] 03/15/2014   Chronic cough - seeing pulmonologist in [place], [facility] 03/14/2014   Disorder of bone and cartilage 12/05/2008    PCP: Ozell Heron HERO, MD  REFERRING PROVIDER: Joane Artist RAMAN, MD  REFERRING DIAG: [phone] (ICD-10-CM) - Chronic  pain of right lower extremity R42 (ICD-10-CM) - Dizziness  THERAPY DIAG:  Dizziness and giddiness  Unsteadiness on feet  Pain in right ankle and joints of right foot  Difficulty in walking, not elsewhere classified  ONSET DATE: 1 month  Rationale for Evaluation and Treatment: Rehabilitation  SUBJECTIVE:   SUBJECTIVE STATEMENT: I'm doing much better. Has been going to the pool to walk and swim in the water. Been able to go up and down the steps bending the ankle most of the time.    Pt accompanied by: self  PERTINENT HISTORY: CTS, chronic cough, R tib/fib ORIF, CLL  PAIN:  Are you having pain? Yes: NPRS scale: 1/10 Pain location: R posterior knee Pain description: ache Aggravating factors: bending the ankle, walking  Relieving factors: hokas, shots, massage  PRECAUTIONS: Fall  RED FLAGS: None   WEIGHT BEARING RESTRICTIONS: No  FALLS: Has patient fallen in last 6 months? No  LIVING ENVIRONMENT: Lives with: lives with their spouse Lives in: House/apartment Stairs: 3 story home; 3-7 steps to enter Has following equipment at home: Single point cane and Environmental consultant - 2 wheeled  PLOF: Independent, Vocation/Vocational requirements: retired, and Leisure: pilates   PATIENT GOALS: improve dizziness and R ankle pain  OBJECTIVE:     TODAY'S TREATMENT: 06/22/24 Activity Comments  R DF stretch with foot on step 10x5 Good tolerance  alt heel raises 2x10 Demo and cueing; good tolerance  Trialed scifit and nustep to assess for max benefit/tolerance  and reduced aggravation of R LE pain  2 min each. Pt tolerated these well. Reports most comfort   R step ups on 8 step 2x10 1 UE support; tendency to hoist self up the step   R step downs on 4 step with heel touch 2x10  Good tolerance; heavy B UE support      PATIENT EDUCATION: Education details: POC reduction to 1/xweek to allow more time between appointments as patient is seeing progress; answered pt's questions on  progressing overall exercise tolerance, HEP update  Person educated: Patient Education method: Explanation Education comprehension: verbalized understanding   HOME EXERCISE PROGRAM Last updated: 06/18/24 Access Code: [email or web address] URL: [email or web address] Date: 06/22/2024 Prepared by: [facility] - Outpatient  Rehab - [facility]  Exercises - Standing Gastroc Stretch at Counter  - 1 x daily - 5 x weekly - 2 sets - 30 sec hold - Heel Raises with Counter Support  - 1 x daily - 5 x weekly - 2 sets - 10 reps - Hooklying Hamstring Stretch with Strap  - 1 x daily - 5 x weekly - 2 sets - 30 sec hold - Standing Ankle Dorsiflexion Stretch on Chair  - 1 x daily - 5 x weekly - 2 sets - 10 reps - 5 sec hold - Alternating Step Forward with Support  - 1 x daily - 5 x weekly - 2 sets - 10 reps - Ankle Eversion with Resistance  - 1 x daily - 5 x weekly - 2 sets - 10 reps - Seated Ankle Inversion with Resistance (Mirrored)  - 1 x daily - 5 x weekly - 2 sets - 10 reps - Modified Thomas Stretch  - 1 x daily - 5 x weekly - 2 sets - 30 sec  hold - Seated Ankle Inversion Eversion PROM  - 1 x daily - 5 x weekly - 2 sets - 10 reps - 5 sec hold - Standing Toe Dorsiflexion Stretch  - 1 x daily - 5 x weekly - 2 sets - 5 reps - 10 sec hold - Standing Ankle Dorsiflexor Toe Extensor Stretch  - 1 x daily - 5 x weekly - 2 sets - 5 reps - 10 sec hold - Forward Step Up  - 1 x daily - 5 x weekly - 2 sets - 10 reps - Forward Step Down with Heel Tap and Counter Support  - 1 x daily - 5 x weekly - 2 sets - 10 reps    Note: Objective measures were completed at Evaluation unless otherwise noted.  DIAGNOSTIC FINDINGS:  Doppler was negative   COGNITION: Overall cognitive status: Within functional limits for tasks assessed  POSTURE:  Slight forward head  GAIT: Gait pattern: antalgic on R LE; slightly unsteady  Assistive device utilized: None Level of assistance: SBA  PATIENT SURVEYS:  DHI  32/100  VESTIBULAR ASSESSMENT:   GENERAL OBSERVATION: pt wears trifocals    OCULOMOTOR EXAM: Ocular Alignment: Cover/Uncover Test: WNL Cover/Cross Cover Test: WNL   Ocular ROM: No Limitations   Spontaneous Nystagmus: absent   Gaze-Induced Nystagmus: absent   Smooth Pursuits: intact   Saccades: intact     VESTIBULAR - OCULAR REFLEX:    Slow VOR: Normal   VOR Cancellation: Normal   Head-Impulse Test: HIT Right: positive HIT Left: positive     POSITIONAL TESTING:  Right Roll Test: negative Left Roll Test: negative   Right Loaded Dix-Hallpike: negative Left Loaded Dix-Hallpike: negative    M-CTSIB  Condition 1: Firm Surface,  EO 30 Sec, Normal Sway  Condition 2: Firm Surface, EC 30 Sec, Mild and Moderate Sway  Condition 3: Foam Surface, EO 30 Sec, Mild and Moderate Sway  Condition 4: Foam Surface, EC 6 Sec, Severe Sway                                                                                                                              TREATMENT DATE: 05/14/24  PATIENT EDUCATION: Education details: exam findings and edu on differences between vestibular loss and positional vertigo, prognosis, POC, edu on benefits of OT- pt agreeable; advised to keep a journal of which activities bring on dizziness Person educated: Patient Education method: Explanation Education comprehension: verbalized understanding  HOME EXERCISE PROGRAM:  GOALS: Goals reviewed with patient? Yes  SHORT TERM GOALS: Target date: 06/11/2024  Patient to be independent with initial HEP. Baseline: HEP initiated Goal status: MET    LONG TERM GOALS: Target date: 07/16/2024  Patient to be independent with advanced HEP. Baseline: Not yet initiated  Goal status: IN PROGRESS  Patient to report 0/10 dizziness with standing vertical and horizontal VOR for 30 seconds. Baseline: Unable Goal status: IN PROGRESS  Patient to demonstrate 30 sec without LOB on M-CTSIB condition with eyes closed/foam  surface in order to improve safety in environments with uneven surfaces and dim lighting. Baseline: 6 sec Goal status: IN PROGRESS  Patient to score at least 23/30 on FGA in order to decrease risk of falls. Baseline: NT Goal status: IN PROGRESS  Patient to report tolerance for standing for 1 hour without pain limiting.  Baseline: Unable to tolerate standing in grocery line Goal status: IN PROGRESS  Patient to score at least 18 points less on DHI in order to meet MCID and improve functional outcomes.  Baseline: 32 Goal status: IN PROGRESS   Patient to demonstrate R ankle AROM improved by at least 5 degrees.  Baseline: see visit 05/31/24 Goal status: IN PROGRESS   ASSESSMENT:  CLINICAL IMPRESSION: Patient arrived to session with report of continued improvement in R LE pain; notes return back to the pool which is helping. She reports noticing less stamina in the pool than before, thus provided edu and trialed cardio machines to edu pt on different mode of endurance exercise. Patient performed R ankle ROM and strengthening activities with cueing for proper positioning. Patient is demonstrating improved tolerance for ankle and calf strengthening in WBing positions. Remaining R quad weakness limits control with stair navigation. Patient tolerated session well and without complaints at end of session.   OBJECTIVE IMPAIRMENTS: Abnormal gait, decreased activity tolerance, decreased balance, decreased knowledge of use of DME, decreased mobility, difficulty walking, decreased ROM, dizziness, hypomobility, impaired flexibility, postural dysfunction, and pain.   ACTIVITY LIMITATIONS: carrying, lifting, bending, standing, squatting, stairs, transfers, bed mobility, bathing, reach over head, hygiene/grooming, and locomotion level  PARTICIPATION LIMITATIONS: meal prep, cleaning, laundry, driving, shopping, community activity, yard work, and church  PERSONAL FACTORS: Age, Past/current experiences,  Time  since onset of injury/illness/exacerbation, and 3+ comorbidities: CTS, chronic cough, R tib/fib ORIF, CLL are also affecting patient's functional outcome.   REHAB POTENTIAL: Good  CLINICAL DECISION MAKING: Evolving/moderate complexity  EVALUATION COMPLEXITY: Moderate   PLAN:  PT FREQUENCY: 2x/week  PT DURATION: 8 weeks  PLANNED INTERVENTIONS: 97164- PT Re-evaluation, 97750- Physical Performance Testing, 97110-Therapeutic exercises, 97530- Therapeutic activity, 97112- Neuromuscular re-education, 97535- Self Care, 02859- Manual therapy, [phone]- Gait training, [phone]- Canalith repositioning, [id number]- Aquatic Therapy, [phone]- Electrical stimulation (unattended), Patient/Family education, Balance training, Stair training, Taping, Dry Needling, Joint mobilization, Spinal mobilization, Vestibular training, DME instructions, Cryotherapy, and Moist heat  PLAN FOR NEXT SESSION: R ankle ROM and LE strength; multisensory balance and habituation      Louana Terrilyn Christians, PT, DPT 06/22/24 8:41 AM  [facility] at [facility] [address], [address] [place], [place] [address] Phone # [phone] Fax # [phone]

## 2024-06-22 ENCOUNTER — Encounter: Payer: Self-pay | Admitting: Physical Therapy

## 2024-06-22 ENCOUNTER — Ambulatory Visit: Admitting: Occupational Therapy

## 2024-06-22 ENCOUNTER — Ambulatory Visit: Admitting: Physical Therapy

## 2024-06-22 DIAGNOSIS — M25571 Pain in right ankle and joints of right foot: Secondary | ICD-10-CM

## 2024-06-22 DIAGNOSIS — R42 Dizziness and giddiness: Secondary | ICD-10-CM | POA: Diagnosis not present

## 2024-06-22 DIAGNOSIS — R262 Difficulty in walking, not elsewhere classified: Secondary | ICD-10-CM

## 2024-06-22 DIAGNOSIS — M25532 Pain in left wrist: Secondary | ICD-10-CM

## 2024-06-22 DIAGNOSIS — R2681 Unsteadiness on feet: Secondary | ICD-10-CM
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
## 2024-06-25 ENCOUNTER — Ambulatory Visit: Admitting: Occupational Therapy

## 2024-06-25 ENCOUNTER — Ambulatory Visit: Admitting: Physical Therapy

## 2024-06-25 DIAGNOSIS — R42 Dizziness and giddiness: Secondary | ICD-10-CM | POA: Diagnosis not present

## 2024-06-25 DIAGNOSIS — M25531 Pain in right wrist: Secondary | ICD-10-CM

## 2024-06-25 LAB — GENECONNECT MOLECULAR SCREEN: Genetic Analysis Overall Interpretation: NEGATIVE
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
## 2024-06-29 ENCOUNTER — Ambulatory Visit: Admitting: Occupational Therapy

## 2024-06-29 ENCOUNTER — Ambulatory Visit: Admitting: Physical Therapy

## 2024-07-01 ENCOUNTER — Ambulatory Visit: Admitting: Occupational Therapy

## 2024-07-01 ENCOUNTER — Ambulatory Visit: Admitting: Physical Therapy

## 2024-07-05 NOTE — Therapy (Signed)
 OUTPATIENT PHYSICAL THERAPY VESTIBULAR TREATMENT     Patient Name: Paula [name] MRN: [id number] DOB:03-Feb-1951, 73 y.o., female Today's Date: 07/05/2024  END OF SESSION:           Past Medical History:  Diagnosis Date   CARPAL TUNNEL SYNDROME, MILD 12/05/2008   Chronic cough - seeing pulmonologist in [place], [facility] 03/14/2014   HSV 03/01/2010   Leg skin lesion, left 01/30/2015   precancerous cells per patient   OSTEOPENIA 12/05/2008   PARESTHESIA 09/01/2009   Waldenstroms macroglobulinemia - followed by Dr. Eyvonne [place], [facility] 03/15/2014   Past Surgical History:  Procedure Laterality Date   CHOLECYSTECTOMY  02/2017   LEG SKIN LESION  BIOPSY / EXCISION Left 01/30/2015   Dr Darice Gould-precancerous cells per patient   ORIF TIBIA & FIBULA FRACTURES Right 1988   TONSILLECTOMY AND ADENOIDECTOMY  1958   Patient Active Problem List   Diagnosis Date Noted   Synovial cyst of wrist, right 10/18/2022   Arthritis of right ankle 11/28/2021   Patellofemoral arthritis of right knee 01/18/2020   Cold sore 01/18/2020   CLL (chronic lymphocytic leukemia) (HCC) 03/16/2018   Vaccine contraindicated - live vaccines contraindicated 04/20/2014   Waldenstroms macroglobulinemia - followed by Dr. Eyvonne [place], [facility] 03/15/2014   Chronic cough - seeing pulmonologist in [place], [facility] 03/14/2014   Disorder of bone and cartilage 12/05/2008    PCP: Ozell Heron HERO, MD  REFERRING PROVIDER: Joane Artist RAMAN, MD  REFERRING DIAG: [phone] (ICD-10-CM) - Chronic pain of right lower extremity R42 (ICD-10-CM) - Dizziness  THERAPY DIAG:  No diagnosis found.  ONSET DATE: 1 month  Rationale for Evaluation and Treatment: Rehabilitation  SUBJECTIVE:   SUBJECTIVE STATEMENT: I'm doing much better. Has been going to the pool to walk and swim in the water. Been able to go up and down the steps bending the ankle most of  the time.    Pt accompanied by: self  PERTINENT HISTORY: CTS, chronic cough, R tib/fib ORIF, CLL  PAIN:  Are you having pain? Yes: NPRS scale: 1/10 Pain location: R posterior knee Pain description: ache Aggravating factors: bending the ankle, walking  Relieving factors: hokas, shots, massage  PRECAUTIONS: Fall  RED FLAGS: None   WEIGHT BEARING RESTRICTIONS: No  FALLS: Has patient fallen in last 6 months? No  LIVING ENVIRONMENT: Lives with: lives with their spouse Lives in: House/apartment Stairs: 3 story home; 3-7 steps to enter Has following equipment at home: Single point cane and Environmental consultant - 2 wheeled  PLOF: Independent, Vocation/Vocational requirements: retired, and Leisure: pilates   PATIENT GOALS: improve dizziness and R ankle pain  OBJECTIVE:    TODAY'S TREATMENT: 07/06/24 Activity Comments                       TODAY'S TREATMENT: 06/22/24 Activity Comments  R DF stretch with foot on step 10x5 Good tolerance  alt heel raises 2x10 Demo and cueing; good tolerance  Trialed scifit and nustep to assess for max benefit/tolerance and reduced aggravation of R LE pain  2 min each. Pt tolerated these well. Reports most comfort   R step ups on 8 step 2x10 1 UE support; tendency to hoist self up the step   R step downs on 4 step with heel touch 2x10  Good tolerance; heavy B UE support      PATIENT EDUCATION: Education details: POC reduction to 1/xweek to allow more time between appointments as patient is seeing  progress; answered pt's questions on progressing overall exercise tolerance, HEP update  Person educated: Patient Education method: Explanation Education comprehension: verbalized understanding   HOME EXERCISE PROGRAM Last updated: 06/18/24 Access Code: [email or web address] URL: [email or web address] Date: 06/22/2024 Prepared by: [facility] - Outpatient  Rehab - [facility]  Exercises - Standing Gastroc Stretch at Counter  - 1 x daily - 5  x weekly - 2 sets - 30 sec hold - Heel Raises with Counter Support  - 1 x daily - 5 x weekly - 2 sets - 10 reps - Hooklying Hamstring Stretch with Strap  - 1 x daily - 5 x weekly - 2 sets - 30 sec hold - Standing Ankle Dorsiflexion Stretch on Chair  - 1 x daily - 5 x weekly - 2 sets - 10 reps - 5 sec hold - Alternating Step Forward with Support  - 1 x daily - 5 x weekly - 2 sets - 10 reps - Ankle Eversion with Resistance  - 1 x daily - 5 x weekly - 2 sets - 10 reps - Seated Ankle Inversion with Resistance (Mirrored)  - 1 x daily - 5 x weekly - 2 sets - 10 reps - Modified Thomas Stretch  - 1 x daily - 5 x weekly - 2 sets - 30 sec  hold - Seated Ankle Inversion Eversion PROM  - 1 x daily - 5 x weekly - 2 sets - 10 reps - 5 sec hold - Standing Toe Dorsiflexion Stretch  - 1 x daily - 5 x weekly - 2 sets - 5 reps - 10 sec hold - Standing Ankle Dorsiflexor Toe Extensor Stretch  - 1 x daily - 5 x weekly - 2 sets - 5 reps - 10 sec hold - Forward Step Up  - 1 x daily - 5 x weekly - 2 sets - 10 reps - Forward Step Down with Heel Tap and Counter Support  - 1 x daily - 5 x weekly - 2 sets - 10 reps    Note: Objective measures were completed at Evaluation unless otherwise noted.  DIAGNOSTIC FINDINGS:  Doppler was negative   COGNITION: Overall cognitive status: Within functional limits for tasks assessed  POSTURE:  Slight forward head  GAIT: Gait pattern: antalgic on R LE; slightly unsteady  Assistive device utilized: None Level of assistance: SBA  PATIENT SURVEYS:  DHI 32/100  VESTIBULAR ASSESSMENT:   GENERAL OBSERVATION: pt wears trifocals    OCULOMOTOR EXAM: Ocular Alignment: Cover/Uncover Test: WNL Cover/Cross Cover Test: WNL   Ocular ROM: No Limitations   Spontaneous Nystagmus: absent   Gaze-Induced Nystagmus: absent   Smooth Pursuits: intact   Saccades: intact     VESTIBULAR - OCULAR REFLEX:    Slow VOR: Normal   VOR Cancellation: Normal   Head-Impulse Test: HIT Right:  positive HIT Left: positive     POSITIONAL TESTING:  Right Roll Test: negative Left Roll Test: negative   Right Loaded Dix-Hallpike: negative Left Loaded Dix-Hallpike: negative    M-CTSIB  Condition 1: Firm Surface, EO 30 Sec, Normal Sway  Condition 2: Firm Surface, EC 30 Sec, Mild and Moderate Sway  Condition 3: Foam Surface, EO 30 Sec, Mild and Moderate Sway  Condition 4: Foam Surface, EC 6 Sec, Severe Sway  TREATMENT DATE: 05/14/24  PATIENT EDUCATION: Education details: exam findings and edu on differences between vestibular loss and positional vertigo, prognosis, POC, edu on benefits of OT- pt agreeable; advised to keep a journal of which activities bring on dizziness Person educated: Patient Education method: Explanation Education comprehension: verbalized understanding  HOME EXERCISE PROGRAM:  GOALS: Goals reviewed with patient? Yes  SHORT TERM GOALS: Target date: 06/11/2024  Patient to be independent with initial HEP. Baseline: HEP initiated Goal status: MET    LONG TERM GOALS: Target date: 07/16/2024  Patient to be independent with advanced HEP. Baseline: Not yet initiated  Goal status: IN PROGRESS  Patient to report 0/10 dizziness with standing vertical and horizontal VOR for 30 seconds. Baseline: Unable Goal status: IN PROGRESS  Patient to demonstrate 30 sec without LOB on M-CTSIB condition with eyes closed/foam surface in order to improve safety in environments with uneven surfaces and dim lighting. Baseline: 6 sec Goal status: IN PROGRESS  Patient to score at least 23/30 on FGA in order to decrease [name] of falls. Baseline: 17 Goal status: IN PROGRESS  Patient to report tolerance for standing for 1 hour without pain limiting.  Baseline: Unable to tolerate standing in grocery line Goal status: IN PROGRESS  Patient to score  at least 18 points less on DHI in order to meet MCID and improve functional outcomes.  Baseline: 32 Goal status: IN PROGRESS   Patient to demonstrate R ankle AROM improved by at least 5 degrees.  Baseline: see visit 05/31/24 Goal status: IN PROGRESS   ASSESSMENT:  CLINICAL IMPRESSION: Patient arrived to session with report of continued improvement in R LE pain; notes return back to the pool which is helping. She reports noticing less stamina in the pool than before, thus provided edu and trialed cardio machines to edu pt on different mode of endurance exercise. Patient performed R ankle ROM and strengthening activities with cueing for proper positioning. Patient is demonstrating improved tolerance for ankle and calf strengthening in WBing positions. Remaining R quad weakness limits control with stair navigation. Patient tolerated session well and without complaints at end of session.   OBJECTIVE IMPAIRMENTS: Abnormal gait, decreased activity tolerance, decreased balance, decreased knowledge of use of DME, decreased mobility, difficulty walking, decreased ROM, dizziness, hypomobility, impaired flexibility, postural dysfunction, and pain.   ACTIVITY LIMITATIONS: carrying, lifting, bending, standing, squatting, stairs, transfers, bed mobility, bathing, reach over head, hygiene/grooming, and locomotion level  PARTICIPATION LIMITATIONS: meal prep, cleaning, laundry, driving, shopping, community activity, yard work, and church  PERSONAL FACTORS: Age, Past/current experiences, Time since onset of injury/illness/exacerbation, and 3+ comorbidities: CTS, chronic cough, R tib/fib ORIF, CLL are also affecting patient's functional outcome.   REHAB POTENTIAL: Good  CLINICAL DECISION MAKING: Evolving/moderate complexity  EVALUATION COMPLEXITY: Moderate   PLAN:  PT FREQUENCY: 2x/week  PT DURATION: 8 weeks  PLANNED INTERVENTIONS: 97164- PT Re-evaluation, 97750- Physical Performance Testing,  97110-Therapeutic exercises, 97530- Therapeutic activity, 97112- Neuromuscular re-education, 97535- Self Care, 02859- Manual therapy, [phone]- Gait training, [phone]- Canalith repositioning, [id number]- Aquatic Therapy, [phone]- Electrical stimulation (unattended), Patient/Family education, Balance training, Stair training, Taping, Dry Needling, Joint mobilization, Spinal mobilization, Vestibular training, DME instructions, Cryotherapy, and Moist heat  PLAN FOR NEXT SESSION: R ankle ROM and LE strength; multisensory balance and habituation      Louana Terrilyn Christians, PT, DPT 07/05/24 10:38 AM  [facility] at [facility] [address] [place], [place] [address] Phone # [phone] Fax # [phone]

## 2024-07-06 ENCOUNTER — Ambulatory Visit: Attending: Family Medicine | Admitting: Physical Therapy

## 2024-07-06 ENCOUNTER — Ambulatory Visit: Admitting: Occupational Therapy

## 2024-07-06 ENCOUNTER — Encounter: Payer: Self-pay | Admitting: Physical Therapy

## 2024-07-06 DIAGNOSIS — R262 Difficulty in walking, not elsewhere classified: Secondary | ICD-10-CM | POA: Insufficient documentation

## 2024-07-06 DIAGNOSIS — R2681 Unsteadiness on feet: Secondary | ICD-10-CM | POA: Diagnosis present

## 2024-07-06 DIAGNOSIS — R42 Dizziness and giddiness: Secondary | ICD-10-CM | POA: Diagnosis present

## 2024-07-06 DIAGNOSIS — M25571 Pain in right ankle and joints of right foot: Secondary | ICD-10-CM | POA: Insufficient documentation

## 2024-07-09 ENCOUNTER — Ambulatory Visit: Admitting: Occupational Therapy

## 2024-07-09 ENCOUNTER — Encounter: Admitting: Physical Therapy

## 2024-07-12 ENCOUNTER — Ambulatory Visit (INDEPENDENT_AMBULATORY_CARE_PROVIDER_SITE_OTHER)

## 2024-07-12 ENCOUNTER — Other Ambulatory Visit: Payer: Self-pay

## 2024-07-12 ENCOUNTER — Ambulatory Visit: Admitting: Family Medicine

## 2024-07-12 VITALS — BP 132/82 | HR 82 | Ht 65.0 in | Wt 163.0 lb

## 2024-07-12 DIAGNOSIS — M25561 Pain in right knee: Secondary | ICD-10-CM | POA: Diagnosis not present

## 2024-07-12 DIAGNOSIS — M25571 Pain in right ankle and joints of right foot: Secondary | ICD-10-CM | POA: Diagnosis not present

## 2024-07-12 DIAGNOSIS — G8929 Other chronic pain: Secondary | ICD-10-CM | POA: Diagnosis not present

## 2024-07-12 NOTE — Progress Notes (Signed)
 LILLETTE Ileana Collet, PhD, LAT, ATC acting as a scribe for Artist Lloyd, MD.  Paula [name] is a 73 y.o. female who presents to [facility] at [facility] today for cont'd bilat wrist and hand pain. Pt was last seen by Dr. Lloyd on 05/31/24 and was advised to cont to hand therapy, completing 4 visits.   Today, pt reports OT made a new brace/splint that she finds helpful during the day. The HEP and exercises at therapy are not very helpful.   PT for R ankle pain has be helpful. She notes overdoing it at a PT visit doing heel raises and having increased pain for about a wk. She was finding walking in the water helpful.   She notes leaving for a cruise on Thursday through October.   Pain is dominant in the right ankle and right knee.  Dx imaging: 05/31/24 R & L hand XR  Pertinent review of systems: No fevers or chills  Relevant historical information: Hand arthritis   Exam:  BP 132/82   Pulse 82   Ht 5' 5 (1.651 m)   Wt 163 lb (73.9 kg)   SpO2 98%   BMI 27.12 kg/m  General: Well Developed, well nourished, and in no acute distress.   MSK: Right knee normal-appearing normal motion intact strength.  Right ankle normal-appearing tender palpation lateral joint line.  Intact strength.    Lab and Radiology Results  Procedure: Real-time Ultrasound Guided Injection of right knee joint superior lateral patella space Device: Philips Affiniti 50G/GE Logiq Images permanently stored and available for review in PACS Verbal informed consent obtained.  Discussed risks and benefits of procedure. Warned about infection, bleeding, hyperglycemia damage to structures among others. Patient expresses understanding and agreement Time-out conducted.   Noted no overlying erythema, induration, or other signs of local infection.   Skin prepped in a sterile fashion.   Local anesthesia: Topical Ethyl chloride.   With sterile technique and under real time ultrasound guidance: 40 mg of  Kenalog  and 2 mL of Marcaine injected into knee joint. Fluid seen entering the joint capsule.   Completed without difficulty   Pain immediately resolved suggesting accurate placement of the medication.   Advised to call if fevers/chills, erythema, induration, drainage, or persistent bleeding.   Images permanently stored and available for review in the ultrasound unit.  Impression: Technically successful ultrasound guided injection.   Procedure: Real-time Ultrasound Guided Injection of right ankle lateral approach Device: Philips Affiniti 50G/GE Logiq Images permanently stored and available for review in PACS Verbal informed consent obtained.  Discussed risks and benefits of procedure. Warned about infection, bleeding, hyperglycemia damage to structures among others. Patient expresses understanding and agreement Time-out conducted.   Noted no overlying erythema, induration, or other signs of local infection.   Skin prepped in a sterile fashion.   Local anesthesia: Topical Ethyl chloride.   With sterile technique and under real time ultrasound guidance: 40 mg of Kenalog  and 2 mL's of Marcaine injected into lateral ankle joint. Fluid seen entering the joint capsule.   Completed without difficulty   Pain immediately resolved suggesting accurate placement of the medication.   Advised to call if fevers/chills, erythema, induration, drainage, or persistent bleeding.   Images permanently stored and available for review in the ultrasound unit.  Impression: Technically successful ultrasound guided injection.    X-ray images right knee obtained today personally and independently interpreted. Mild DJD Await formal radiology review    Assessment and Plan: 73 y.o. female  with chronic right knee and ankle pain.  Patient has severe DJD right ankle.  He she has a little bit of arthritis in the right knee that is also causing her pain.  Plan for steroid injection both areas prior to her cruise.  Use  meloxicam  as needed sparingly.  Check back as needed.   PDMP not reviewed this encounter. Orders Placed This Encounter  Procedures   US  LIMITED JOINT SPACE STRUCTURES LOW RIGHT(NO LINKED CHARGES)    Reason for Exam (SYMPTOM  OR DIAGNOSIS REQUIRED):   right ankle pain    Preferred imaging location?:   [facility] Knee AP/LAT W/Sunrise Right    Standing Status:   Future    Number of Occurrences:   1    Expiration Date:   08/12/2024    Reason for Exam (SYMPTOM  OR DIAGNOSIS REQUIRED):   right knee pain    Preferred imaging location?:   [facility]   No orders of the defined types were placed in this encounter.    Discussed warning signs or symptoms. Please see discharge instructions. Patient expresses understanding.   The above documentation has been reviewed and is accurate and complete Artist Lloyd, M.D.

## 2024-07-12 NOTE — Patient Instructions (Addendum)
 Thank you for coming in today.   Please get an Xray today before you leave   You received an injection today. Seek immediate medical attention if the joint becomes red, extremely painful, or is oozing fluid.   Have a wonderful time on your cruise!  Reminder: [name] will be out of the office starting August 1st, for about 6 weeks

## 2024-07-12 NOTE — Therapy (Signed)
 OUTPATIENT PHYSICAL THERAPY VESTIBULAR  NOTE     Patient Name: Paula [name] MRN: [id number] DOB:12-01-1951, 73 y.o., female Today's Date: 07/13/2024   Progress Note Reporting Period 07/06/24 to 07/13/24  See note below for Objective Data and Assessment of Progress/Goals.     END OF SESSION:  PT End of Session - 07/13/24 0828     Visit Number 10    Number of Visits 17    Date for PT Re-Evaluation 07/16/24    Authorization Type Medicare/Aetna    PT Start Time 0803    PT Stop Time 0838    PT Time Calculation (min) 35 min    Activity Tolerance Patient tolerated treatment well    Behavior During Therapy [facility] for tasks assessed/performed                   Past Medical History:  Diagnosis Date   CARPAL TUNNEL SYNDROME, MILD 12/05/2008   Chronic cough - seeing pulmonologist in [place], [facility] 03/14/2014   HSV 03/01/2010   Leg skin lesion, left 01/30/2015   precancerous cells per patient   OSTEOPENIA 12/05/2008   PARESTHESIA 09/01/2009   Waldenstroms macroglobulinemia - followed by Dr. Eyvonne, [place], [facility] 03/15/2014   Past Surgical History:  Procedure Laterality Date   CHOLECYSTECTOMY  02/2017   LEG SKIN LESION  BIOPSY / EXCISION Left 01/30/2015   Dr Darice Gould-precancerous cells per patient   ORIF TIBIA & FIBULA FRACTURES Right 1988   TONSILLECTOMY AND ADENOIDECTOMY  1958   Patient Active Problem List   Diagnosis Date Noted   Synovial cyst of wrist, right 10/18/2022   Arthritis of right ankle 11/28/2021   Patellofemoral arthritis of right knee 01/18/2020   Cold sore 01/18/2020   CLL (chronic lymphocytic leukemia) (HCC) 03/16/2018   Vaccine contraindicated - live vaccines contraindicated 04/20/2014   Waldenstroms macroglobulinemia - followed by Dr. Eyvonne [place], [facility] 03/15/2014   Chronic cough - seeing pulmonologist in [place], [facility] 03/14/2014   Disorder of bone and cartilage 12/05/2008     PCP: Ozell Heron HERO, MD  REFERRING PROVIDER: Joane Artist RAMAN, MD  REFERRING DIAG: [phone] (ICD-10-CM) - Chronic pain of right lower extremity R42 (ICD-10-CM) - Dizziness  THERAPY DIAG:  Dizziness and giddiness  Unsteadiness on feet  Pain in right ankle and joints of right foot  Difficulty in walking, not elsewhere classified  ONSET DATE: 1 month  Rationale for Evaluation and Treatment: Rehabilitation  SUBJECTIVE:   SUBJECTIVE STATEMENT: Had a shot in my ankle and my knee. Reports that the pain relief was instantaneous. Went to [employer] and by the end of the night she had some pain but not as bad. Patient agreeable to 30 day hold d/t wanting to pursue surgical consult and going on a cruise.   Pt accompanied by: self  PERTINENT HISTORY: CTS, chronic cough, R tib/fib ORIF, CLL  PAIN:  Are you having pain? Yes: NPRS scale: 2/10 Pain location: R ankle and R posterior knee Pain description: ache Aggravating factors: bending the ankle, walking  Relieving factors: hokas, shots, massage  PRECAUTIONS: Fall  RED FLAGS: None   WEIGHT BEARING RESTRICTIONS: No  FALLS: Has patient fallen in last 6 months? No  LIVING ENVIRONMENT: Lives with: lives with their spouse Lives in: House/apartment Stairs: 3 story home; 3-7 steps to enter Has following equipment at home: Single point cane and Environmental consultant - 2 wheeled  PLOF: Independent, Vocation/Vocational requirements: retired, and Leisure: pilates   PATIENT GOALS: improve dizziness  and R ankle pain  OBJECTIVE:     TODAY'S TREATMENT: 07/13/24 Activity Comments  - Standing Gastroc Stretch at Counter  - 1 x daily - 5 x weekly - 2 sets - 30 sec hold - Hooklying Hamstring Stretch with Strap  - 1 x daily - 5 x weekly - 2 sets - 30 sec hold - Standing Ankle Dorsiflexion Stretch on Chair  - 1 x daily - 5 x weekly - 2 sets - 10 reps - 5 sec hold - Modified Thomas Stretch  - 1 x daily - 5 x weekly - 2 sets - 30 sec  hold -  Seated Ankle Inversion Eversion PROM  - 1 x daily - 5 x weekly - 2 sets - 10 reps - 5 sec hold -Ankle inversion/eversion ROM with towel Provided corrective cueing for positioning and for max stretch  Sitting heel/toe raise  Good ROM and tolerance                  PATIENT EDUCATION: Education details: discussed progress towards goals and remaining impairments, 30 day hold  Person educated: Patient Education method: Explanation Education comprehension: verbalized understanding    HOME EXERCISE PROGRAM Last updated: 07/13/24 Access Code: [email or web address] URL: [email or web address] Date: 06/22/2024 Prepared by: [facility] - Outpatient  Rehab - [facility]  Exercises - Standing Gastroc Stretch at Counter  - 1 x daily - 5 x weekly - 2 sets - 30 sec hold - Heel Raises with Counter Support  - 1 x daily - 5 x weekly - 2 sets - 10 reps - Hooklying Hamstring Stretch with Strap  - 1 x daily - 5 x weekly - 2 sets - 30 sec hold - Standing Ankle Dorsiflexion Stretch on Chair  - 1 x daily - 5 x weekly - 2 sets - 10 reps - 5 sec hold - Alternating Step Forward with Support  - 1 x daily - 5 x weekly - 2 sets - 10 reps - Ankle Eversion with Resistance  - 1 x daily - 5 x weekly - 2 sets - 10 reps - Seated Ankle Inversion with Resistance (Mirrored)  - 1 x daily - 5 x weekly - 2 sets - 10 reps - Modified Thomas Stretch  - 1 x daily - 5 x weekly - 2 sets - 30 sec  hold - Seated Ankle Inversion Eversion PROM  - 1 x daily - 5 x weekly - 2 sets - 10 reps - 5 sec hold - Standing Toe Dorsiflexion Stretch  - 1 x daily - 5 x weekly - 2 sets - 5 reps - 10 sec hold - Standing Ankle Dorsiflexor Toe Extensor Stretch  - 1 x daily - 5 x weekly - 2 sets - 5 reps - 10 sec hold - Forward Step Up  - 1 x daily - 5 x weekly - 2 sets - 10 reps - Forward Step Down with Heel Tap and Counter Support  - 1 x daily - 5 x weekly - 2 sets - 10 reps *resume ankle inversion/eversion ROM with towel - Seated Heel Toe  Raises  - 1 x daily - 5 x weekly - 2 sets - 10 reps  *highlighted exercises to resume as part of HEP when experiencing flare   Note: Objective measures were completed at Evaluation unless otherwise noted.  DIAGNOSTIC FINDINGS:  Doppler was negative   COGNITION: Overall cognitive status: Within functional limits for tasks assessed  POSTURE:  Slight forward head  GAIT: Gait  pattern: antalgic on R LE; slightly unsteady  Assistive device utilized: None Level of assistance: SBA  PATIENT SURVEYS:  DHI 32/100  VESTIBULAR ASSESSMENT:   GENERAL OBSERVATION: pt wears trifocals    OCULOMOTOR EXAM: Ocular Alignment: Cover/Uncover Test: WNL Cover/Cross Cover Test: WNL   Ocular ROM: No Limitations   Spontaneous Nystagmus: absent   Gaze-Induced Nystagmus: absent   Smooth Pursuits: intact   Saccades: intact     VESTIBULAR - OCULAR REFLEX:    Slow VOR: Normal   VOR Cancellation: Normal   Head-Impulse Test: HIT Right: positive HIT Left: positive     POSITIONAL TESTING:  Right Roll Test: negative Left Roll Test: negative   Right Loaded Dix-Hallpike: negative Left Loaded Dix-Hallpike: negative    M-CTSIB  Condition 1: Firm Surface, EO 30 Sec, Normal Sway  Condition 2: Firm Surface, EC 30 Sec, Mild and Moderate Sway  Condition 3: Foam Surface, EO 30 Sec, Mild and Moderate Sway  Condition 4: Foam Surface, EC 6 Sec, Severe Sway                                                                                                                              TREATMENT DATE: 05/14/24  PATIENT EDUCATION: Education details: exam findings and edu on differences between vestibular loss and positional vertigo, prognosis, POC, edu on benefits of OT- pt agreeable; advised to keep a journal of which activities bring on dizziness Person educated: Patient Education method: Explanation Education comprehension: verbalized understanding  HOME EXERCISE PROGRAM:  GOALS: Goals reviewed with  patient? Yes  SHORT TERM GOALS: Target date: 06/11/2024  Patient to be independent with initial HEP. Baseline: HEP initiated Goal status: MET    LONG TERM GOALS: Target date: 07/16/2024  Patient to be independent with advanced HEP. Baseline: Not yet initiated;pt reports avoiding activities for the past week d/t R ankle pain 07/06/24 Goal status: IN PROGRESS 07/06/24  Patient to report 0/10 dizziness with standing vertical and horizontal VOR for 30 seconds. Baseline: Unable; no dizziness 07/06/24 Goal status: MET 07/06/24  Patient to demonstrate 30 sec without LOB on M-CTSIB condition with eyes closed/foam surface in order to improve safety in environments with uneven surfaces and dim lighting. Baseline: 6 sec; 6 sec 07/06/24 Goal status: IN PROGRESS 07/06/24  Patient to score at least 23/30 on FGA in order to decrease risk of falls. Baseline: 17; 18/30 07/06/24 Goal status: IN PROGRESS 07/06/24  Patient to report tolerance for standing for 1 hour without pain limiting.  Baseline: Unable to tolerate standing in grocery line; pt reports perceived decrease in standing tolerance since her flare 07/06/24 Goal status: IN PROGRESS 07/06/24  Patient to score at least 18 points less on DHI in order to meet MCID and improve functional outcomes.  Baseline: 32; 0 07/06/24 Goal status: MET 07/06/24   Patient to demonstrate R ankle AROM improved by at least 5 degrees.  Baseline: see visit 05/31/24 R ankle AROM revealed increased restriction in  DF and inversion, but improvement in PF and eversion compared to initial measures. R knee extension is grossly unchanged. 07/06/24 Goal status: IN PROGRESS 07/06/24   ASSESSMENT:  CLINICAL IMPRESSION: Patient arrived to session with report of improved pain after cortisone injection yesterday. Reviewed modified HEP to ensure patient is comfortable with a set of exercises to continue dueing 30 day hold period. Goals were assessed last session. Patient reported understanding of edu  provided. No complaints upon leaving.   OBJECTIVE IMPAIRMENTS: Abnormal gait, decreased activity tolerance, decreased balance, decreased knowledge of use of DME, decreased mobility, difficulty walking, decreased ROM, dizziness, hypomobility, impaired flexibility, postural dysfunction, and pain.   ACTIVITY LIMITATIONS: carrying, lifting, bending, standing, squatting, stairs, transfers, bed mobility, bathing, reach over head, hygiene/grooming, and locomotion level  PARTICIPATION LIMITATIONS: meal prep, cleaning, laundry, driving, shopping, community activity, yard work, and church  PERSONAL FACTORS: Age, Past/current experiences, Time since onset of injury/illness/exacerbation, and 3+ comorbidities: CTS, chronic cough, R tib/fib ORIF, CLL are also affecting patient's functional outcome.   REHAB POTENTIAL: Good  CLINICAL DECISION MAKING: Evolving/moderate complexity  EVALUATION COMPLEXITY: Moderate   PLAN:  PT FREQUENCY: 2x/week  PT DURATION: 8 weeks  PLANNED INTERVENTIONS: 97164- PT Re-evaluation, 97750- Physical Performance Testing, 97110-Therapeutic exercises, 97530- Therapeutic activity, 97112- Neuromuscular re-education, 97535- Self Care, [id number]- Manual therapy, [phone]- Gait training, [phone]- Canalith repositioning, [id number]- Aquatic Therapy, [id number]- Electrical stimulation (unattended), Patient/Family education, Balance training, Stair training, Taping, Dry Needling, Joint mobilization, Spinal mobilization, Vestibular training, DME instructions, Cryotherapy, and Moist heat  PLAN FOR NEXT SESSION: 30 day hold at this time      Louana Terrilyn Christians, PT, DPT 07/13/24 8:39 AM  [facility] at [facility] [address], [address] [place], [place] [address] Phone # [phone] Fax # [phone]

## 2024-07-13 ENCOUNTER — Ambulatory Visit: Payer: Self-pay | Admitting: Family Medicine

## 2024-07-13 ENCOUNTER — Ambulatory Visit: Admitting: Occupational Therapy

## 2024-07-13 ENCOUNTER — Encounter: Payer: Self-pay | Admitting: Physical Therapy

## 2024-07-13 ENCOUNTER — Ambulatory Visit: Admitting: Physical Therapy

## 2024-07-13 DIAGNOSIS — R42 Dizziness and giddiness: Secondary | ICD-10-CM | POA: Diagnosis not present

## 2024-07-13 DIAGNOSIS — R262 Difficulty in walking, not elsewhere classified: Secondary | ICD-10-CM

## 2024-07-13 DIAGNOSIS — R2681 Unsteadiness on feet: Secondary | ICD-10-CM

## 2024-07-13 DIAGNOSIS — M25571 Pain in right ankle and joints of right foot: Secondary | ICD-10-CM

## 2024-07-13 NOTE — Progress Notes (Signed)
Right knee x-ray shows some arthritis.

## 2024-07-15 ENCOUNTER — Encounter: Admitting: Physical Therapy

## 2024-09-06 ENCOUNTER — Encounter: Payer: Self-pay | Admitting: Family Medicine

## 2024-09-06 ENCOUNTER — Ambulatory Visit (INDEPENDENT_AMBULATORY_CARE_PROVIDER_SITE_OTHER): Admitting: Family Medicine

## 2024-09-06 VITALS — BP 128/64 | HR 75 | Temp 97.8°F | Wt 164.3 lb

## 2024-09-06 DIAGNOSIS — Z7184 Encounter for health counseling related to travel: Secondary | ICD-10-CM

## 2024-09-06 DIAGNOSIS — R053 Chronic cough: Secondary | ICD-10-CM | POA: Diagnosis not present

## 2024-09-06 MED ORDER — AZITHROMYCIN 250 MG PO TABS: ORAL_TABLET | ORAL | 0 refills | Status: DC

## 2024-09-06 MED ORDER — SCOPOLAMINE 1 MG/3DAYS TD PT72: 1.0000 | MEDICATED_PATCH | TRANSDERMAL | 1 refills | Status: DC

## 2024-09-06 MED ORDER — ALBUTEROL SULFATE HFA 108 (90 BASE) MCG/ACT IN AERS: 2.0000 | INHALATION_SPRAY | Freq: Four times a day (QID) | RESPIRATORY_TRACT | 2 refills | Status: AC | PRN

## 2024-09-06 NOTE — Patient Instructions (Addendum)
 Claritin, zyrtec, allegra --1 tablet daily of one of these medications

## 2024-09-06 NOTE — Progress Notes (Signed)
 Acute Office Visit  Subjective:     Patient ID: Paula [name], female    DOB: [birth year], 73 y.o.   MRN: [id number]  Chief Complaint  Patient presents with   Sinus Problem    Patient complains of congestion in the throat, denies cough at this time and requests an inhaler to assist with another upcoming cruise as she had a non-productive cough a few weeks ago while sitting in rooms for lectures given during the cruise,  seen by a provider given an inhaler and Z-pack    Sinus Problem   Discussed the use of AI scribe software for clinical note transcription with the patient, who gave verbal consent to proceed.  History of Present Illness   Paula [name] is a 73 year old female with non-Hodgkin's lymphoma who presents with a persistent dry cough and throat congestion.  She has a history of non-Hodgkin's lymphoma and is currently on CalQuest, a chemotherapy drug, taken twice daily. Her blood tests are within normal range except for white blood cells, which are affected by her condition. She has difficulty fighting off illnesses, impacting her skin healing, such as cuts.  She experienced a persistent dry cough and throat congestion starting before a 35-day cruise in July. She sought urgent care and was prescribed prednisone and a Z-Pak (antibiotic) for ten days. Prednisone was taken first, followed by the Z-Pak, but neither provided significant relief. She then used over-the-counter Mucinex D and cough drops, which helped slightly. The cough was particularly severe during quiet activities, leading to episodes of uncontrollable coughing, crying, and urinary incontinence. The symptoms subsided just before the end of the cruise, but she still feels congestion in her throat and hears it in her voice.  She has a history of a similar persistent cough in 2015, which led to the diagnosis of non-Hodgkin's lymphoma after a pulmonologist's evaluation. She has not seen a pulmonologist recently. Her last  chest x-ray in March was clear.  No shortness of breath, chest pain, or fever during this period. She experiences urinary incontinence, especially when not feeling well, and wakes up five to six times a night to urinate. She also mentions diarrhea as a side effect of her medications.  Her symptoms worsen when she is quiet, such as during presentations, but improve when she is active or talking. She has a history of using an inhaler, which she found effective, but her last inhaler expired over a year ago.       Review of Systems  All other systems reviewed and are negative.       Objective:    BP 128/64   Pulse 75   Temp 97.8 F (36.6 C) (Oral)   Wt 164 lb 4.8 oz (74.5 kg)   SpO2 99%   BMI 27.34 kg/m    Physical Exam Vitals reviewed.  Constitutional:      Appearance: Normal appearance. She is well-groomed and normal weight.  HENT:     Right Ear: Tympanic membrane normal.     Left Ear: Tympanic membrane normal.     Nose: No congestion or rhinorrhea.     Mouth/Throat:     Mouth: Mucous membranes are moist.     Pharynx: No posterior oropharyngeal erythema.  Eyes:     Conjunctiva/sclera: Conjunctivae normal.  Neck:     Thyroid : No thyromegaly.  Cardiovascular:     Rate and Rhythm: Normal rate and regular rhythm.     Heart sounds: S1 normal and S2 normal.  Pulmonary:     Effort: Pulmonary effort is normal.     Breath sounds: Normal breath sounds and air entry.  Musculoskeletal:     Right lower leg: No edema.     Left lower leg: No edema.  Lymphadenopathy:     Cervical: No cervical adenopathy.  Neurological:     Mental Status: She is alert.     Gait: Gait is intact.  Psychiatric:        Mood and Affect: Affect normal.        Speech: Speech normal.     No results found for any visits on 09/06/24.      Assessment & Plan:   Problem List Items Addressed This Visit       Unprioritized   Chronic cough - seeing pulmonologist in [place], [facility] - [facility]   Relevant Medications   albuterol  (VENTOLIN  HFA) 108 (90 Base) MCG/ACT inhaler   Other Relevant Orders   Ambulatory referral to Pulmonology   Other Visit Diagnoses       Travel advice encounter       Relevant Medications   azithromycin  (ZITHROMAX  Z-PAK) 250 MG tablet   scopolamine  (TRANSDERM-SCOP) 1 MG/3DAYS     Assessment and Plan    Chronic cough and throat congestion, possible asthma or allergy-related Chronic cough and congestion for over two months, possibly asthma or allergies. Differential includes postnasal drip, acid reflux, bronchospasm. Previous treatments minimally effective. Symptoms improve with activity. Clear chest x-ray in March. - Prescribe albuterol  inhaler. - Recommend over-the-counter antihistamine. - Refer to pulmonologist for lung function testing. - Consider allergist referral if symptoms persist post-pulmonary evaluation. - Discuss with oncologist about earlier chest CT scan.  Urinary incontinence Incontinence during coughing, frequent nocturia noted. Worsens with illness. - Consider further evaluation if symptoms persist or worsen.  Travel -- pt requesting abx and scopolamine  patches for her upcoming cruise. Counseled patient on abx and not to take unless she becomes ill.   Meds ordered this encounter  Medications   albuterol  (VENTOLIN  HFA) 108 (90 Base) MCG/ACT inhaler    Sig: Inhale 2 puffs into the lungs every 6 (six) hours as needed for wheezing or shortness of breath.    Dispense:  8 g    Refill:  2   azithromycin  (ZITHROMAX  Z-PAK) 250 MG tablet    Sig: Take package as directed.    Dispense:  6 each    Refill:  0   scopolamine  (TRANSDERM-SCOP) 1 MG/3DAYS    Sig: Place 1 patch (1 mg total) onto the skin every 3 (three) days.    Dispense:  4 patch    Refill:  1    No follow-ups on file.  Heron CHRISTELLA Sharper, MD

## 2024-09-07 ENCOUNTER — Ambulatory Visit (INDEPENDENT_AMBULATORY_CARE_PROVIDER_SITE_OTHER): Admitting: Pulmonary Disease

## 2024-09-07 ENCOUNTER — Encounter: Payer: Self-pay | Admitting: Pulmonary Disease

## 2024-09-07 VITALS — BP 138/88 | HR 73 | Temp 97.9°F | Ht 66.0 in | Wt 163.4 lb

## 2024-09-07 DIAGNOSIS — C8200 Follicular lymphoma grade I, unspecified site: Secondary | ICD-10-CM

## 2024-09-07 DIAGNOSIS — C9110 Chronic lymphocytic leukemia of B-cell type not having achieved remission: Secondary | ICD-10-CM | POA: Diagnosis not present

## 2024-09-07 DIAGNOSIS — C8800 Waldenstrom macroglobulinemia not having achieved remission: Secondary | ICD-10-CM

## 2024-09-07 DIAGNOSIS — J45909 Unspecified asthma, uncomplicated: Secondary | ICD-10-CM

## 2024-09-07 DIAGNOSIS — R059 Cough, unspecified: Secondary | ICD-10-CM

## 2024-09-07 LAB — NITRIC OXIDE: Nitric Oxide: 24

## 2024-09-07 MED ORDER — ARNUITY ELLIPTA 100 MCG/ACT IN AEPB: 1.0000 | INHALATION_SPRAY | Freq: Every day | RESPIRATORY_TRACT | 3 refills | Status: AC

## 2024-09-07 NOTE — Patient Instructions (Addendum)
 VISIT SUMMARY:  Today, we discussed your persistent cough and its possible connection to cough-variant asthma. We reviewed your history with non-Hodgkin's lymphoma and your current treatment with Calquence. We also talked about your recent symptoms and the various treatments you have tried.  YOUR PLAN:  -COUGH-VARIANT ASTHMA WITH CHRONIC COUGH: Cough-variant asthma is a type of asthma where the main symptom is a dry, persistent cough. It can be triggered by environmental factors or allergens. We will order pulmonary function tests to assess your lung function. You are prescribed an Arnuity inhaler for daily use to help manage your symptoms; please remember to rinse your mouth after using it. Keep your albuterol  inhaler for rescue use during coughing episodes. Avoid menthol or mint in cough drops to prevent rebound cough, and use non-mentholated throat soothers like Ludens or hard candy like Coca-Cola instead. Carry water to help manage your cough. Prednisone is prescribed for potential exacerbations during travel, and you can use Delsym as an over-the-counter cough suppressant if needed.  INSTRUCTIONS:  Please schedule and complete the pulmonary function tests as soon as possible. Continue using your prescribed inhalers as directed and follow the recommendations provided for managing your cough. You are scheduled for a CT scan in December 2025 to monitor your follicular lymphoma. Follow up with your regular blood tests every three months as usual.

## 2024-09-07 NOTE — Progress Notes (Signed)
 Subjective:    Patient ID: Paula [name], female    DOB: [birth year], 73 y.o.   MRN: [id number]  Patient Care Team: Ozell Heron HERO, MD as PCP - General (Family Medicine) Robinson Pao, MD as Consulting Physician (Dermatology) Eyvonne Ned, MD as Referring Physician (Internal Medicine)  Chief Complaint  Patient presents with   Consult    Cough and post nasal drip x 3 months.     BACKGROUND: Patient is a very complex 73 year-old lifelong never smoker with a history of non-Hodgkin's lymphoma, CLL and Waldenstrm's macroglobulinemia who presents for evaluation of a persistent, recurrent, dry cough.  Prior episode occurring around 2014-2015 evaluated by Dr. Ned Blacker in [place].  Recurrent issue and this year in March 2025, subsided somewhat and then restarted again in June.  She is kindly referred by Dr. Heron Ozell.   HPI Discussed the use of AI scribe software for clinical note transcription with the patient, who gave verbal consent to proceed.  History of Present Illness   Paula [name] is a 73 year old female with non-Hodgkin's lymphoma who presents with a persistent cough.  She has had a persistent dry cough since before 2014, lasting for several months at a time. In 2015, she was evaluated by a pulmonologist and prescribed an inhaler, which provided relief. She does not recall the specific diagnosis given at that time.  She does not recall which inhaler was provided.  It appears that she underwent a methacholine challenge test.  In 2015, during a workup for her cough, she was diagnosed with non-Hodgkin's lymphoma. Initially, she was on a 'watch and wait' approach, but about three years ago, the condition progressed, leading to treatment with chemotherapy injections for six months. She is currently on Calquence, an oral medication, which has normalized her blood test results except for her white blood cell count.  Around the end of June 2025, she developed a cough and  congestion. She was prescribed prednisone and a Azithromycin  Z-Pak by urgent care before a cruise, but neither provided significant relief. She also tried Mucinex D, cough syrup, and throat lozenges, but continued to experience severe coughing fits, especially when sitting still, leading to episodes of urinary incontinence and watery eyes. These episodes occurred about ten times during her cruise.  Since returning from the cruise, her cough and congestion have diminished but persist. No history of reflux or childhood asthma. She recalls undergoing a methacholine challenge test in 2014, but does not remember the results. She has not used her albuterol  inhaler for about a year, as it typically resolves her issues with one or two puffs, but she has not started the newly prescribed inhaler yet.  Her last blood work was done in July 2025 at [facility] in [place], and she undergoes regular blood tests every three months. She had a chest x-ray in March 2025 and a PET CT in December 2024, both of which were clear. She is scheduled for another CT in December 2025 to monitor her follicular lymphoma.   She is a lifelong never smoker.  She resides with her husband, Wadie, in [facility] [place] .  No significant occupational exposure in the past.  Does do significant amount of traveling by boat cruises.    Review of Systems A 10 point review of systems was performed and it is as noted above otherwise negative.   Past Medical History:  Diagnosis Date   CARPAL TUNNEL SYNDROME, MILD 12/05/2008   Chronic cough - seeing pulmonologist  in [place], [facility] 03/14/2014   HSV 03/01/2010   Leg skin lesion, left 01/30/2015   precancerous cells per patient   OSTEOPENIA 12/05/2008   PARESTHESIA 09/01/2009   Waldenstroms macroglobulinemia - followed by Dr. Eyvonne, [place], [facility] 03/15/2014    Past Surgical History:  Procedure Laterality Date   CHOLECYSTECTOMY  02/2017   LEG SKIN LESION   BIOPSY / EXCISION Left 01/30/2015   Dr Darice Gould-precancerous cells per patient   ORIF TIBIA & FIBULA FRACTURES Right 1988   TONSILLECTOMY AND ADENOIDECTOMY  1958    Patient Active Problem List   Diagnosis Date Noted   Synovial cyst of wrist, right 10/18/2022   Arthritis of right ankle 11/28/2021   Patellofemoral arthritis of right knee 01/18/2020   Cold sore 01/18/2020   CLL (chronic lymphocytic leukemia) (HCC) 03/16/2018   Vaccine contraindicated - live vaccines contraindicated 04/20/2014   Waldenstroms macroglobulinemia - followed by Dr. Eyvonne, [place], [facility] 03/15/2014   Chronic cough - seeing pulmonologist in [place], [facility] 03/14/2014   Disorder of bone and cartilage 12/05/2008    Family History  Problem Relation Age of Onset   Hypertension Mother    Hypertension Brother    Colon cancer Father 32   Hypertension Son    Cancer Neg Hx        lung ca - mother's side   Arthritis Neg Hx        osteo - mother's side   Breast cancer Neg Hx     Social History   Tobacco Use   Smoking status: Never   Smokeless tobacco: Never  Substance Use Topics   Alcohol use: Yes    Alcohol/week: 6.0 standard drinks of alcohol    Types: 6 Glasses of wine per week    Allergies  Allergen Reactions   Ciprofloxacin Nausea Only    Current Meds  Medication Sig   acalabrutinib (CALQUENCE) 100 MG capsule Take 100 mg by mouth 2 (two) times daily.   albuterol  (VENTOLIN  HFA) 108 (90 Base) MCG/ACT inhaler Inhale 2 puffs into the lungs every 6 (six) hours as needed for wheezing or shortness of breath.   CALQUENCE 100 MG tablet Take by mouth.   fluorouracil (EFUDEX) 5 % cream Apply topically daily.   Fluticasone  Furoate (ARNUITY ELLIPTA ) 100 MCG/ACT AEPB Inhale 1 puff into the lungs daily.   scopolamine  (TRANSDERM-SCOP) 1 MG/3DAYS Place 1 patch (1 mg total) onto the skin every 3 (three) days.   valACYclovir (VALTREX) 1000 MG tablet Take by mouth.     Immunization History  Administered Date(s) Administered   Fluad Quad(high Dose 65+) 10/03/2022   Hepatitis B 03/01/2010   Hepatitis B, PED/ADOLESCENT 03/01/2010   Influenza, Seasonal, Injecte, Preservative Fre 10/13/2017, 08/30/2018   Influenza,inj,Quad PF,6+ Mos 10/25/2013   Influenza-Unspecified 08/30/2018   PFIZER(Purple Top)SARS-COV-2 Vaccination 01/20/2020, 02/09/2020, 10/19/2020   Pneumococcal Conjugate-13 08/10/2015   Pneumococcal Polysaccharide-23 08/11/2018   Pneumococcal-Unspecified 08/11/2018   Tdap 03/14/2014   Zoster Recombinant(Shingrix ) 09/03/2018, 11/03/2018        Objective:     BP 138/88   Pulse 73   Temp 97.9 F (36.6 C) (Temporal)   Ht 5' 6 (1.676 m)   Wt 163 lb 6.4 oz (74.1 kg)   SpO2 100%   BMI 26.37 kg/m   SpO2: 100 %  GENERAL: Well-developed, well-nourished woman, no acute distress.  Fully ambulatory.  No conversational dyspnea. HEAD: Normocephalic, atraumatic.  EYES: Pupils equal, round, reactive to light.  No scleral icterus.  MOUTH:  Dentition intact, oral mucosa moist.  No thrush. NECK: Supple. No thyromegaly. Trachea midline. No JVD.  No adenopathy. PULMONARY: Good air entry bilaterally.  No adventitious sounds. CARDIOVASCULAR: S1 and S2. Regular rate and rhythm.  No rubs, murmurs or gallops heard. ABDOMEN: Benign. MUSCULOSKELETAL: No joint deformity, no clubbing, no edema.  NEUROLOGIC: No overt focal deficit, no gait disturbance, speech is fluent. SKIN: Intact,warm,dry. PSYCH: Mood and behavior normal.  Lab Results  Component Value Date   NITRICOXIDE 24 09/07/2024  *Low level of type II inflammation present, clinical correlation necessary.  Chest x-ray performed 25 March 2024, independently reviewed, no acute disease:   Assessment & Plan:     ICD-10-CM   1. Asthma, unspecified asthma severity, unspecified whether complicated, unspecified whether persistent  J45.909 Pulmonary function test    2. Cough, unspecified type   R05.9 Nitric oxide     3. Follicular lymphoma grade I, unspecified body region (HCC)  C82.00     4. CLL (chronic lymphocytic leukemia) (HCC)  C91.10     5. Waldenstrom macroglobulinemia  C88.00      Orders Placed This Encounter  Procedures   Nitric oxide    Pulmonary function test    Standing Status:   Future    Expected Date:   09/21/2024    Expiration Date:   09/07/2025    Where should this test be performed?:   Outpatient Pulmonary    What type of PFT is being ordered?:   Full PFT   Meds ordered this encounter  Medications   Fluticasone  Furoate (ARNUITY ELLIPTA ) 100 MCG/ACT AEPB    Sig: Inhale 1 puff into the lungs daily.    Dispense:  30 each    Refill:  3   Discussion:    Cough-variant asthma with chronic cough Chronic cough for approximately three months, exacerbated by quiet environments and possibly temperature changes. Non-Hodgkin's lymphoma under current treatment with Calquence and previous chemotherapy. No reflux or childhood asthma. Previous methacholine challenge test likely positive, suggesting asthma. Current eosinophil count not elevated, indicating no active allergic response. Chest x-ray and PET CT previously clear. Symptoms suggestive of cough-variant asthma, possibly triggered by environmental factors or allergens.  Nitric oxide  level low indicating low level of type II inflammation. - Order pulmonary function tests to assess lung function. - Prescribe Arnuity 100 inhaler for daily use as a maintenance therapy. Instruct to rinse mouth after use to prevent side effects. - Advise to keep albuterol  inhaler for rescue use during coughing episodes. Discuss potential side effects, including rare systemic effects and common side effects like jitteriness. - Instruct to avoid menthol or mint in cough drops to prevent rebound cough. - Recommend using non-mentholated throat soothers like Luden's or hard candy like Jolly Ranchers. - Advise carrying water to help manage  cough. - Prescribed prednisone for potential exacerbations during travel (last prescription from primary care physician). - Recommend Delsym as an over-the-counter cough suppressant if needed.     Will see the patient in follow-up upon her return from cruise in December.  In the meantime we will obtain PFTs prior to her leaving on her cruise.  Advised if symptoms do not improve or worsen, to please contact office for sooner follow up or seek emergency care.    I spent 60 minutes of dedicated to the care of this patient on the date of this encounter to include pre-visit review of records, face-to-face time with the patient discussing conditions above, post visit ordering of testing, clinical documentation with the electronic health  record, making appropriate referrals as documented, and communicating necessary findings to members of the patients care team.   C. Leita Sanders, MD Advanced Bronchoscopy PCCM [facility]    *This note was dictated using voice recognition software/Dragon.  Despite best efforts to proofread, errors can occur which can change the meaning. Any transcriptional errors that result from this process are unintentional and may not be fully corrected at the time of dictation.

## 2024-09-13 ENCOUNTER — Ambulatory Visit (INDEPENDENT_AMBULATORY_CARE_PROVIDER_SITE_OTHER): Admitting: Pulmonary Disease

## 2024-09-13 DIAGNOSIS — J45909 Unspecified asthma, uncomplicated: Secondary | ICD-10-CM

## 2024-09-13 LAB — PULMONARY FUNCTION TEST
DL/VA % pred: 95 %
DL/VA: 3.9 ml/min/mmHg/L
DLCO unc % pred: 94 %
DLCO unc: 19.18 ml/min/mmHg
FEF 25-75 Post: 1.72 L/s
FEF 25-75 Pre: 1.28 L/s
FEF2575-%Change-Post: 34 %
FEF2575-%Pred-Post: 94 %
FEF2575-%Pred-Pre: 70 %
FEV1-%Change-Post: 4 %
FEV1-%Pred-Post: 92 %
FEV1-%Pred-Pre: 88 %
FEV1-Post: 2.16 L
FEV1-Pre: 2.06 L
FEV1FVC-%Change-Post: 0 %
FEV1FVC-%Pred-Pre: 96 %
FEV6-%Change-Post: 5 %
FEV6-%Pred-Post: 99 %
FEV6-%Pred-Pre: 94 %
FEV6-Post: 2.94 L
FEV6-Pre: 2.78 L
FEV6FVC-%Change-Post: 1 %
FEV6FVC-%Pred-Post: 104 %
FEV6FVC-%Pred-Pre: 102 %
FVC-%Change-Post: 4 %
FVC-%Pred-Post: 95 %
FVC-%Pred-Pre: 91 %
FVC-Post: 2.95 L
FVC-Pre: 2.83 L
Post FEV1/FVC ratio: 73 %
Post FEV6/FVC ratio: 100 %
Pre FEV1/FVC ratio: 73 %
Pre FEV6/FVC Ratio: 98 %
RV % pred: 112 %
RV: 2.66 L
TLC % pred: 105 %
TLC: 5.64 L

## 2024-09-13 NOTE — Patient Instructions (Signed)
 Full PFT completed today ? ?

## 2024-09-13 NOTE — Progress Notes (Signed)
 Full PFT completed today ? ?

## 2024-09-16 ENCOUNTER — Ambulatory Visit: Payer: Self-pay | Admitting: Pulmonary Disease

## 2024-09-20 NOTE — Telephone Encounter (Signed)
 Copied from CRM [id number]. Topic: Clinical - Lab/Test Results >> Sep 20, 2024 12:36 PM Rilla B wrote: Reason for CRM: Patient would like a call regarding test results. Results are visible in Mychart but she needs someone to help her understand.  Also confirm that you also received fax results that were sent over.  Please call patient @ [phone].

## 2024-09-24 ENCOUNTER — Encounter: Payer: Self-pay | Admitting: Adult Health

## 2024-09-24 ENCOUNTER — Ambulatory Visit (INDEPENDENT_AMBULATORY_CARE_PROVIDER_SITE_OTHER): Admitting: Adult Health

## 2024-09-24 ENCOUNTER — Telehealth: Payer: Self-pay

## 2024-09-24 VITALS — BP 120/80 | HR 63 | Temp 98.1°F | Ht 66.0 in | Wt 162.0 lb

## 2024-09-24 DIAGNOSIS — N898 Other specified noninflammatory disorders of vagina: Secondary | ICD-10-CM

## 2024-09-24 DIAGNOSIS — B009 Herpesviral infection, unspecified: Secondary | ICD-10-CM

## 2024-09-24 LAB — POCT URINALYSIS DIPSTICK
Bilirubin, UA: NEGATIVE
Blood, UA: NEGATIVE
Glucose, UA: NEGATIVE
Ketones, UA: NEGATIVE
Leukocytes, UA: NEGATIVE
Nitrite, UA: NEGATIVE
Protein, UA: POSITIVE — AB
Spec Grav, UA: 1.025 (ref 1.010–1.025)
Urobilinogen, UA: 0.2 U/dL
pH, UA: 6 (ref 5.0–8.0)

## 2024-09-24 NOTE — Telephone Encounter (Signed)
 Copied from CRM #[id number]. Topic: Clinical - Request for Lab/Test Order >> Sep 24, 2024  9:49 AM Paula [name] wrote: Reason for CRM: Patient has bumps on the side of her vaginal area along with vaginal drainage. She is wondering if Dr. Ozell would be able to send in a urine analysis & culture since there are no available appointments at the clinic today.  Please call the patient back, as she is aware of the turn around time.

## 2024-09-24 NOTE — Progress Notes (Signed)
 Subjective:    Patient ID: Paula [name], female    DOB: 05/03/1951, 73 y.o.   MRN: [id number]  HPI 73 year old female who  has a past medical history of CARPAL TUNNEL SYNDROME, MILD (12/05/2008), Chronic cough - seeing pulmonologist in [place], [facility] (03/14/2014), HSV (03/01/2010), Leg skin lesion, left (01/30/2015), OSTEOPENIA (12/05/2008), PARESTHESIA (09/01/2009), and Waldenstroms macroglobulinemia - followed by Dr. Eyvonne, [place], [facility] (03/15/2014).  She is a patient of Dr. Ozell who I am seeing today for an acute visit.   She reports a history of herpes simplex and recently noticed small bumps on the left side of her vagina which were similar to prior outbreaks. She also reports vaginal discharge that is clear is color and without odor which she is not accustomed to. She started taking Valtrex and the bumps improved but the vaginal moisture remains. She denies vaginal itching or burning. She has not noticed any UTI symptoms such as dysuria, hematuria or urgency.  She is getting ready to go on a cruise through the [place] and wants to make sure nothing is wrong and would like to check for a UTI.   Review of Systems See HPI   Past Medical History:  Diagnosis Date   CARPAL TUNNEL SYNDROME, MILD 12/05/2008   Chronic cough - seeing pulmonologist in [place], [facility] 03/14/2014   HSV 03/01/2010   Leg skin lesion, left 01/30/2015   precancerous cells per patient   OSTEOPENIA 12/05/2008   PARESTHESIA 09/01/2009   Waldenstroms macroglobulinemia - followed by Dr. Eyvonne, [place], [facility] 03/15/2014    Social History   Socioeconomic History   Marital status: Married    Spouse name: Not on file   Number of children: Not on file   Years of education: Not on file   Highest education level: Bachelor's degree (e.g., BA, AB, BS)  Occupational History   Not on file  Tobacco Use   Smoking status: Never   Smokeless tobacco: Never   Substance and Sexual Activity   Alcohol use: Yes    Alcohol/week: 6.0 standard drinks of alcohol    Types: 6 Glasses of wine per week   Drug use: No   Sexual activity: Not on file  Other Topics Concern   Not on file  Social History Narrative   Work or School: IT at [employer] Situation:      Spiritual Beliefs:      Lifestyle:            Social Drivers of Health   Financial Resource Strain: Patient Declined (09/02/2024)   Overall Financial Resource Strain (CARDIA)    Difficulty of Paying Living Expenses: Patient declined  Food Insecurity: Patient Declined (09/02/2024)   Hunger Vital Sign    Worried About Running Out of Food in the Last Year: Patient declined    Ran Out of Food in the Last Year: Patient declined  Transportation Needs: Patient Declined (09/02/2024)   PRAPARE - Administrator, Civil Service (Medical): Patient declined    Lack of Transportation (Non-Medical): Patient declined  Physical Activity: Unknown (09/02/2024)   Exercise Vital Sign    Days of Exercise per Week: Patient declined    Minutes of Exercise per Session: Not on file  Stress: Patient Declined (09/02/2024)   [employer] of Occupational Health - Occupational Stress Questionnaire    Feeling of Stress: Patient declined  Social Connections: Unknown (09/02/2024)   Social Connection and Isolation  Panel    Frequency of Communication with Friends and Family: Patient declined    Frequency of Social Gatherings with Friends and Family: Patient declined    Attends Religious Services: Patient declined    Active Member of Clubs or Organizations: Patient declined    Attends Banker Meetings: Not on file    Marital Status: Married  Intimate Partner Violence: Not At Risk (03/31/2024)   Humiliation, Afraid, Rape, and Kick questionnaire    Fear of Current or Ex-Partner: No    Emotionally Abused: No    Physically Abused: No    Sexually Abused: No    Past Surgical History:  Procedure  Laterality Date   CHOLECYSTECTOMY  02/2017   LEG SKIN LESION  BIOPSY / EXCISION Left 01/30/2015   Dr Darice Gould-precancerous cells per patient   ORIF TIBIA & FIBULA FRACTURES Right 1988   TONSILLECTOMY AND ADENOIDECTOMY  1958    Family History  Problem Relation Age of Onset   Hypertension Mother    Hypertension Brother    Colon cancer Father 61   Hypertension Son    Cancer Neg Hx        lung ca - mother's side   Arthritis Neg Hx        osteo - mother's side   Breast cancer Neg Hx     Allergies  Allergen Reactions   Ciprofloxacin Nausea Only    Current Outpatient Medications on File Prior to Visit  Medication Sig Dispense Refill   acalabrutinib (CALQUENCE) 100 MG capsule Take 100 mg by mouth 2 (two) times daily.     albuterol  (VENTOLIN  HFA) 108 (90 Base) MCG/ACT inhaler Inhale 2 puffs into the lungs every 6 (six) hours as needed for wheezing or shortness of breath. 8 g 2   azithromycin  (ZITHROMAX  Z-PAK) 250 MG tablet Take package as directed. 6 each 0   CALQUENCE 100 MG tablet Take by mouth.     fluorouracil (EFUDEX) 5 % cream Apply topically daily.     Fluticasone  Furoate (ARNUITY ELLIPTA ) 100 MCG/ACT AEPB Inhale 1 puff into the lungs daily. 30 each 3   valACYclovir (VALTREX) 1000 MG tablet Take by mouth.     scopolamine  (TRANSDERM-SCOP) 1 MG/3DAYS Place 1 patch (1 mg total) onto the skin every 3 (three) days. 4 patch 1   No current facility-administered medications on file prior to visit.    BP 120/80   Pulse 63   Temp 98.1 F (36.7 C) (Oral)   Ht 5' 6 (1.676 m)   Wt 162 lb (73.5 kg)   SpO2 95%   BMI 26.15 kg/m       Objective:   Physical Exam Vitals and nursing note reviewed.  Constitutional:      Appearance: Normal appearance.  Cardiovascular:     Rate and Rhythm: Normal rate and regular rhythm.     Pulses: Normal pulses.     Heart sounds: Normal heart sounds.  Pulmonary:     Effort: Pulmonary effort is normal.     Breath sounds: Normal breath sounds.   Abdominal:     General: Abdomen is flat. Bowel sounds are normal. There is no distension.     Palpations: Abdomen is soft.     Tenderness: There is no abdominal tenderness.  Musculoskeletal:        General: Normal range of motion.  Skin:    General: Skin is warm and dry.  Neurological:     General: No focal deficit present.  Mental Status: She is alert and oriented to person, place, and time.  Psychiatric:        Mood and Affect: Mood normal.        Behavior: Behavior normal.        Thought Content: Thought content normal.        Judgment: Judgment normal.        Assessment & Plan:  1. Vaginal discharge (Primary) - She opted for Cervicovaginal swab instead of pelvic exam.  - POC UA negative for UTI  - Reassurance given since discharge is clean and non odorous  - Cervicovaginal ancillary only; Future - POCT Urinalysis Dipstick   2. Herpes simplex - Continue with Valtrex until lesions resolve   Darleene Shape, NP  I personally spent a total of 33 minutes in the care of the patient today including preparing to see the patient, getting/reviewing separately obtained history, performing a medically appropriate exam/evaluation, counseling and educating, placing orders, and documenting clinical information in the EHR.

## 2024-09-24 NOTE — Telephone Encounter (Signed)
 Spoke with patient regarding results/recommendations. Appt scheduled

## 2024-09-25 ENCOUNTER — Telehealth: Admitting: Family Medicine

## 2024-09-25 ENCOUNTER — Encounter: Payer: Self-pay | Admitting: Adult Health

## 2024-09-25 DIAGNOSIS — R399 Unspecified symptoms and signs involving the genitourinary system: Secondary | ICD-10-CM

## 2024-09-25 MED ORDER — SULFAMETHOXAZOLE-TRIMETHOPRIM 800-160 MG PO TABS: 1.0000 | ORAL_TABLET | Freq: Two times a day (BID) | ORAL | 0 refills | Status: AC

## 2024-09-25 NOTE — Progress Notes (Signed)
E-Visit for Urinary Problems  We are sorry that you are not feeling well.  Here is how we plan to help!  Based on what you shared with me it looks like you most likely have a simple urinary tract infection.  A UTI (Urinary Tract Infection) is a bacterial infection of the bladder.  Most cases of urinary tract infections are simple to treat but a key part of your care is to encourage you to drink plenty of fluids and watch your symptoms carefully.  I have prescribed Bactrim DS One tablet twice a day for 5 days.  Your symptoms should gradually improve. Call us if the burning in your urine worsens, you develop worsening fever, back pain or pelvic pain or if your symptoms do not resolve after completing the antibiotic.  Urinary tract infections can be prevented by drinking plenty of water to keep your body hydrated.  Also be sure when you wipe, wipe from front to back and don't hold it in!  If possible, empty your bladder every 4 hours.  HOME CARE Drink plenty of fluids Compete the full course of the antibiotics even if the symptoms resolve Remember, when you need to go.go. Holding in your urine can increase the likelihood of getting a UTI! GET HELP RIGHT AWAY IF: You cannot urinate You get a high fever Worsening back pain occurs You see blood in your urine You feel sick to your stomach or throw up You feel like you are going to pass out  MAKE SURE YOU  Understand these instructions. Will watch your condition. Will get help right away if you are not doing well or get worse.   Thank you for choosing an e-visit.  Your e-visit answers were reviewed by a board certified advanced clinical practitioner to complete your personal care plan. Depending upon the condition, your plan could have included both over the counter or prescription medications.  Please review your pharmacy choice. Make sure the pharmacy is open so you can pick up prescription now. If there is a problem, you may contact  your provider through [employer] and have the prescription routed to another pharmacy.  Your safety is important to [place]. If you have drug allergies check your prescription carefully.   For the next 24 hours you can use MyChart to ask questions about today's visit, request a non-urgent call back, or ask for a work or school excuse. You will get an email in the next two days asking about your experience. I hope that your e-visit has been valuable and will speed your recovery. I have spent 5 minutes in review of e-visit questionnaire, review and updating patient chart, medical decision making and response to patient.   Reed Pandy, PA-C

## 2024-09-27 ENCOUNTER — Other Ambulatory Visit (HOSPITAL_COMMUNITY)
Admission: RE | Admit: 2024-09-27 | Discharge: 2024-09-27 | Disposition: A | Discharge status: Home / Self Care | Source: Ambulatory Visit | Attending: Adult Health | Admitting: Adult Health

## 2024-09-27 DIAGNOSIS — N898 Other specified noninflammatory disorders of vagina: Secondary | ICD-10-CM | POA: Insufficient documentation

## 2024-09-28 ENCOUNTER — Ambulatory Visit: Payer: Self-pay | Admitting: Adult Health

## 2024-09-28 LAB — CERVICOVAGINAL ANCILLARY ONLY
Bacterial Vaginitis (gardnerella): NEGATIVE
Candida Glabrata: NEGATIVE
Candida Vaginitis: NEGATIVE
Comment: NEGATIVE
Comment: NEGATIVE
Comment: NEGATIVE

## 2024-09-28 NOTE — Telephone Encounter (Signed)
**Note De-identified  [place] Obfuscation** Please advise 

## 2024-10-06 NOTE — Progress Notes (Unsigned)
   Paula Ileana Collet, PhD, LAT, ATC acting as a scribe for Artist Lloyd, MD.  Paula [name] is a 73 y.o. female who presents to [facility] today for exacerbation of her R knee and R ankle pain. Pt was last seen by Dr. Lloyd on 07/12/24 and was given a R knee and R lateral ankle steroid injections and advised to use meloxicam  sparingly.  Today, pt reports ***  Dx imaging: 07/12/24 R knee XR  02/23/22 R ankle & tib/fib MRI  Pertinent review of systems: ***  Relevant historical information: ***   Exam:  There were no vitals taken for this visit. General: Well Developed, well nourished, and in no acute distress.   MSK: ***    Lab and Radiology Results No results found for this or any previous visit (from the past 72 hours). No results found.     Assessment and Plan: 73 y.o. female with ***   PDMP not reviewed this encounter. No orders of the defined types were placed in this encounter.  No orders of the defined types were placed in this encounter.    Discussed warning signs or symptoms. Please see discharge instructions. Patient expresses understanding.   ***

## 2024-10-07 ENCOUNTER — Ambulatory Visit: Admitting: Family Medicine

## 2024-10-07 ENCOUNTER — Other Ambulatory Visit: Payer: Self-pay

## 2024-10-07 ENCOUNTER — Encounter: Payer: Self-pay | Admitting: Family Medicine

## 2024-10-07 VITALS — BP 120/82 | HR 72 | Ht 66.0 in | Wt 163.0 lb

## 2024-10-07 DIAGNOSIS — M25561 Pain in right knee: Secondary | ICD-10-CM

## 2024-10-07 DIAGNOSIS — M25571 Pain in right ankle and joints of right foot: Secondary | ICD-10-CM | POA: Diagnosis not present

## 2024-10-07 DIAGNOSIS — G8929 Other chronic pain: Secondary | ICD-10-CM

## 2024-10-07 NOTE — Patient Instructions (Signed)
 Thank you for coming in today.   You received an injection today. Seek immediate medical attention if the joint becomes red, extremely painful, or is oozing fluid.   See you back before your trip in January.

## 2024-10-08 ENCOUNTER — Ambulatory Visit: Admitting: Family Medicine

## 2024-10-12 ENCOUNTER — Ambulatory Visit: Admitting: Family Medicine

## 2024-12-06 ENCOUNTER — Encounter: Payer: Self-pay | Admitting: Family Medicine

## 2024-12-06 ENCOUNTER — Ambulatory Visit: Admitting: Family Medicine

## 2024-12-06 VITALS — BP 102/68 | HR 94 | Temp 97.9°F | Ht 66.0 in | Wt 159.2 lb

## 2024-12-06 DIAGNOSIS — R053 Chronic cough: Secondary | ICD-10-CM

## 2024-12-06 DIAGNOSIS — J329 Chronic sinusitis, unspecified: Secondary | ICD-10-CM

## 2024-12-06 DIAGNOSIS — Z13220 Encounter for screening for lipoid disorders: Secondary | ICD-10-CM

## 2024-12-06 DIAGNOSIS — H6692 Otitis media, unspecified, left ear: Secondary | ICD-10-CM

## 2024-12-06 DIAGNOSIS — R110 Nausea: Secondary | ICD-10-CM

## 2024-12-06 LAB — LIPID PANEL
Cholesterol: 170 mg/dL (ref 0–200)
HDL: 49.7 mg/dL (ref >39.00)
LDL Cholesterol: 100 mg/dL — ABNORMAL HIGH (ref 0–99)
NonHDL: 120.49
Total CHOL/HDL Ratio: 3
Triglycerides: 101 mg/dL (ref 0.0–149.0)
VLDL: 20.2 mg/dL (ref 0.0–40.0)

## 2024-12-06 MED ORDER — ONDANSETRON 4 MG PO TBDP: 4.0000 mg | ORAL_TABLET | Freq: Three times a day (TID) | ORAL | 2 refills | Status: AC | PRN

## 2024-12-06 MED ORDER — CIPROFLOXACIN HCL 500 MG PO TABS: 500.0000 mg | ORAL_TABLET | Freq: Two times a day (BID) | ORAL | 0 refills | Status: AC

## 2024-12-06 NOTE — Progress Notes (Unsigned)
   Acute Office Visit  Subjective:     Patient ID: Paula [name], female    DOB: 11/04/1951, 73 y.o.   MRN: [id number]  Chief Complaint  Patient presents with   Ear Pain    Patient complains of left ear pain x6 weeks   Cough    Productive with green sputum  x2 months, used Zpac and Ampicillin with no relief    Cough     Review of Systems  Respiratory:  Positive for cough.   All other systems reviewed and are negative.       Objective:    BP 102/68   Pulse 94   Temp 97.9 F (36.6 C) (Axillary)   Ht 5' 6 (1.676 m)   Wt 159 lb 3.2 oz (72.2 kg)   SpO2 95%   BMI 25.70 kg/m  {Vitals History (Optional):23777}  Physical Exam Vitals reviewed.  Constitutional:      Appearance: Normal appearance. She is normal weight.  Neck:     Thyroid : No thyromegaly.  Cardiovascular:     Rate and Rhythm: Normal rate and regular rhythm.     Heart sounds: Normal heart sounds. No murmur heard. Neurological:     Mental Status: She is alert.     No results found for any visits on 12/06/24.      Assessment & Plan:   Problem List Items Addressed This Visit       Unprioritized   Chronic cough - seeing pulmonologist in [place]   Relevant Orders   Upper Respiratory Culture, Routine   CT SINUS WO CONTRAST   Other Visit Diagnoses       Chronic otitis media of left ear    -  Primary   Relevant Medications   ciprofloxacin  (CIPRO ) 500 MG tablet   Other Relevant Orders   Ambulatory referral to ENT   CT SINUS WO CONTRAST     Chronic sinusitis, unspecified location       Relevant Medications   ciprofloxacin  (CIPRO ) 500 MG tablet   Other Relevant Orders   CT SINUS WO CONTRAST     Nausea       Relevant Medications   ondansetron  (ZOFRAN -ODT) 4 MG disintegrating tablet     Lipid screening       Relevant Orders   Lipid panel       Meds ordered this encounter  Medications   ciprofloxacin  (CIPRO ) 500 MG tablet    Sig: Take 1 tablet (500 mg  total) by mouth 2 (two) times daily for 7 days.    Dispense:  14 tablet    Refill:  0   ondansetron  (ZOFRAN -ODT) 4 MG disintegrating tablet    Sig: Take 1 tablet (4 mg total) by mouth every 8 (eight) hours as needed for nausea or vomiting.    Dispense:  20 tablet    Refill:  2    No follow-ups on file.  Heron CHRISTELLA Sharper, MD

## 2024-12-07 ENCOUNTER — Ambulatory Visit: Admitting: Pulmonary Disease

## 2024-12-08 ENCOUNTER — Ambulatory Visit: Payer: Self-pay | Admitting: Family Medicine

## 2024-12-11 NOTE — Discharge Summary (Signed)
 [facility]  Discharge Summary  Goals of Care  Full Code   PCP: Chiquita Cramp, DPM Discharge Details   Admit date:         12/09/2024 Discharge date and time:       12/12/2024  Hospital LOS:    3 days  Active Hospital Problems   Diagnosis Date Noted POA   *Pneumonia due to organism 12/09/2024 Yes   Rhinosinusitis 12/10/2024 Yes   Hypogammaglobulinemia (*) 12/10/2024 Yes   Follicular lymphoma (*) 06/04/2022 Yes   CLL (chronic lymphocytic leukemia) (*) 01/02/2018 Yes   Waldenstroms macroglobulinemia (*) 01/21/2014 Yes    Resolved Hospital Problems  No resolved problems to display.      Current Discharge Medication List     PAUSE taking these medications as directed      Details  ARNUITY ELLIPTA  100 MCG/ACT Aepb Wait to take this until your doctor or other care provider tells you to start again. Generic drug: Fluticasone  Furoate  Inhale one puff into the lungs daily. Pause reason: Other (Comment) Pause comment: Until you see your pulmonologist       START taking these medications      Details  cetirizine 10 mg tablet Commonly known as: ZYRTEC  Take one tablet (10 mg dose) by mouth daily. Quantity: 30 tablet   dextromethorphan-guaiFENesin 30-600 mg per 12 hr tablet Commonly known as: MUCINEX DM  Take one tablet by mouth daily for 5 days. Quantity: 5 tablet   levofloxacin 750 mg tablet Commonly known as: LEVAQUIN Start taking on: December 13, 2024  Take one tablet (750 mg dose) by mouth daily for 5 days. Indication: Community Acquired Pneumonia Quantity: 5 tablet       CONTINUE these medications which have NOT CHANGED      Details  albuterol  sulfate HFA 108 (90 Base) MCG/ACT inhaler Commonly known as: PROVENTIL ,VENTOLIN ,PROAIR   Inhale two puffs into the lungs every 6 (six) hours as needed.   ALPRAZolam 0.5 mg tablet Commonly known as: XANAX  Take one tablet (0.5 mg dose) by mouth 2 (two) times a  day as needed.   CALQUENCE 100 MG Tabs tablet Generic drug: acalabrutinib maleate  Take one tablet (100 mg dose) by mouth 2 (two) times daily. Quantity: 120 tablet   ciprofloxacin  0.3% ophthalmic solution Commonly known as: CILOXAN   one drop daily. Left EAR   ondansetron  4 mg disintegrating tablet Commonly known as: ZOFRAN -ODT  Take one tablet (4 mg dose) by mouth every 8 (eight) hours as needed.      * You might also be taking other medications not listed above. If you have questions about any of your other medications, talk to the person who prescribed them or your Primary Care Provider.          STOP taking these medications    ciprofloxacin  500 mg tablet Commonly known as: CIPRO           Wound care     Diet:      Dietary Orders  (From admission, onward)           Start     Ordered   12/09/24 2229  Regular Diet  Diet effective now       Process Instructions: No data recorded      12/09/24 2234            Medication Adjustments, D/C instructions and follow up needs    -Follow-up with primary care provider at [facility] in 1 week  for posthospital checkup.  If symptoms not better they may consider a prednisone trial again. -Please follow-up with pulmonary doctor at [facility].  You will need to reschedule this appointment.  They will need to monitor to make sure pneumonia has cleared. -Continue to hold off the Arnuity Ellipta  inhaler until you recover from this pneumonia and follow-up with pulmonary provider for review of PFTs and plan.  Okay to continue to take albuterol  inhaler as needed. -Continue to follow-up with hematology team here.  You have a PET scan in 1 week.  They may consider further IVIG infusions as an outpatient. -Continue Zyrtec trial for allergies and congestion for the next month. -Take Mucinex D for 5 more days for sinus congestion -Take antibiotic called Levaquin for 5 more days to treat the pneumonia and ear infection.  Next  dose due on Monday morning.     -If you have any difficulty getting your medications at discharge or any questions about your hospitalization feel free to contact the Hospitalist group by calling our answering service at [phone] and leave a message.  Recommendations to physicians/Staff, pending labs or incidental findings  -As she has had residual symptoms please follow chest x-ray or further imaging to make sure clears over time - Pending labs -If symptoms not better when sees PCP consider prednisone - May need IVIG monthly  Pending labs:    Unresulted Labs     Order Current Status   Fungitell (1-3)B-D Glucan Assay SO In process   IgE SO In process   Urine legionella antigen In process   Culture, Blood Blood Arm, Left Preliminary result   Culture, Blood Blood Hand, Right Preliminary result   Culture, Respiratory Sputum Trachea Preliminary result       Hospital Course   CC: Dyspnea and cough congestion and ongoing symptoms for multiple months.   History of Present Illness:  Paula [name] is a 73 y.o. female with PMHx: -IgM lambda Waldenstrom's macroglobulinemia (+MYD88, +CXCR4, + TP53), CLL/SLL, and follicular lymphoma, on acalabrutinib -Anemia in neoplastic disease -BCC -Hypertension -CTS - History of low IVIG -Seen by pulmonary at [facility] in September with similar issues.  They did start her on some inhalers.  Felt like perhaps she could have asthma.   Presented to the ED for fatigue, dyspnea, and cough, ongoing for ~3 months, worsening despite outpatient treatment (4 rounds of oral antibiotics - including azithromycin  augmentin and cefuroxime, 2 rounds of oral steroids, and regular inhaler use with no improvement of cough) most recently completed azithromycin  about a week ago. Associated nasal congestion. The cough is productive of clear sputum. She states the symptoms started prior to a recent out of country trip to [place] in September and she has been  coughing ever since. No fever or chills. She was evaluated by doctors on the cruise and at [place] throughout the trip with various treatments.  She has also seen audiology and pulmonary in addition to primary care provider and numerous urgent cares with intermittent treatment over the last couple of months.   In the ED: -Afebrile, HR 100s, SBP 120, sating high 90s on RA -ECG: NSR, HR 91 -CBC: leukocytosis 18.3 with neutrophilic predominance (has been taking steroids), otherwise unremarkable, Hgb 12.4 and PLT 325 -CMP: K 3.6, otherwise unremarkable, sCr 0.87 -Mg 2.0 -NT-proBNP 424 -Negative flu RSV covid -Bcx x2 obtained -Imaging: CXR mild bilateral basilar densities, underlying pneumonia however cannot be excluded. CT PE with no PE, but patchy RLL airspace consolidation/pneumonia, small  R pleural effusion, mild subcarinal lymphadenopathy. -Treated with vanc x1, zosyn x1.   Admitted to floor bed: - Placed on vancomycin and Zosyn. - All viral cultures and the nasal MRSA negative here. - Patient nontoxic-appearing only some mild leukocytosis but no bandemia and procalcitonin reassuring. - She has had lingering symptoms for months since about August or September and seen numerous different specialties in numerous different countries. -PFTs September [place]:: FVC 2.83 over 91% predicted.  Post was 95% predicted.  FEV1 2.06 which is 88% pre-.  92% post.  FEV1/FVC ratio 73%.  Total lung capacity 105% predicted.  DLCO 94% predicted. - Called and updated husband.  He says he 2 was been suffering with lingering respiratory symptoms.  Patient's symptoms started before their cruise.  Their cruise went all the way down to [place].  There were also sick people on the cruise.  Husband is not aware of PFTs or results.  He reports that patient is hesitant to use her inhalers at home. -CT sinus revealed pansinus disease but no air-fluid levels.  Ongoing middle ear effusions.  Started on antihistamine  and some Mucinex D. -CT pulmonary angiogram: No PE.  Patchy right lower lobe airspace changes.  Very small right-sided pleural effusion. - She has a low IVIG and has not received intermittent infusions.  This could be part of the reasons why she has had lingering symptoms.  Seen by malignant hematology and given IVIG infusion on the 12th. - Sputum culture appears to be normal respiratory flora with no significant growth for Staphylococcus aureus or Pseudomonas.  Patient did have improvement here.  Received IVIG.  A lot of her symptoms were congestion related and she tolerated antihistamine and some Mucinex D.  She never had any wheezing here.  Based on antibiotic exposure and profile elected to transition her to oral Levaquin.  She is already established with pulmonary at [facility] and has PCP close contact.  She will continue to follow-up with them for further management.  Complete the course of Levaquin.  If sinus issues persist may consider prednisone with PCP when she sees them in a week.  She will need follow-up imaging of the right lung over time.  She is chronically immunosuppressed.   Specific problems and plan   Pneumonia right base Right basilar changes Chronically immunosuppressed Rhinosinusitis Left otitis media History of low IgG Possible recent asthma -Treatment with Mucinex D plus Claritin -She was not using her steroid inhaler at home.  She has not had any wheezing here.  Instructed to continue to hold as she recovers from this pneumonia until she follows up with her pulmonary provider. - Initially on Vanco Zosyn which was changed to cefepime - All cultures negative.  Including blood and sputum. - Given IVIG on the 12th - Respiratory maneuvers and deep breathing - No wheezing at all here - Discharge on oral Levaquin with Mucinex D Claritin PCP and pulmonary follow-up.   # Waldenstrom's macroglobulinemia # CLL/SLL # Follicular lymphoma -Hold home acalabrutinib (she has it  with her when decide to resume) - Oncology states okay to resume on discharge   Documented history of chemotherapy induced cognitive impairment with some memory lapses   Disposition Plan: Goal is home   Goals of care/CODE STATUS: Full Code   Foley: Not needed   Prophylaxis: Up and ambulatory   Blood Glucose      Lab Results  Component Value Date    Hemoglobin A1c 5.4 12/10/2024  Exam  Still no postnasal drip and nostrils are open Throat is clear with no redness or enlarged tonsils No drainage from ears, Actually has good air movement with no wheezing overall pretty good aeration just faint crackles right base Heart is regular no loud murmur She has no lower extremity edema No concerning joint swelling or skin rashes     Vital signs in last 24 hours: Intake/Output:  Temp:  [99.1 F (37.3 C)-99.4 F (37.4 C)] 99.1 F (37.3 C) Heart Rate:  [73-85] 73 Resp:  [17-18] 17 BP: (142-147)/(61-66) 147/61 SpO2:  [96 %] 96 %   Wt Readings from Last 1 Encounters:  12/11/24 70.7 kg (155 lb 12.8 oz)     Intake/Output Summary (Last 24 hours) at 12/12/2024 0806 Last data filed at 12/11/2024 2356 Gross per 24 hour  Intake 480 ml  Output 1150 ml  Net -670 ml      Labs- Procedures-Imaging   Bedside Procedures   No orders found     Labs on Discharge:  Recent Labs    Units 12/11/24 0201 12/10/24 0228 12/09/24 1405  WBC thou/mcL 4.9 15.9* 18.3*  HGB gm/dL 89.6* 88.1 87.5  HCT % 32.1* 36.5 38.1  PLT thou/mcL 257 291 325    Recent Labs    Units 12/11/24 0201 12/10/24 0228 12/09/24 1405  NA mmol/L 136 136 137  K mmol/L 5.0 3.1* 3.6*  CL mmol/L 105 100 100  CO2 mmol/L 23 23 21   BUN mg/dL 11 9 10   CREATININE mg/dL 9.29 9.29 9.12  CALCIUM mg/dL 8.5* 8.9 9.1   No results for input(s): TSH, HBA1C in the last 168 hours. Recent Labs    Units 12/10/24 0228 12/09/24 1405  BILITOT mg/dL  --  0.9  AST U/L  --  29  ALT U/L  --  26  ALKPHOS U/L  --  130   ALBUMIN gm/dL  --  3.7  LDH U/L 807  --    No results for input(s): LABPROT, INR, PTT in the last 168 hours.  No results for input(s): CHOL, LDL, HDL, TRIG in the last 168 hours.    Post Hospital Care   Discharge Procedure Orders  AMB REFERRAL FOLLOW UP WITH PCP  Standing Status: Future  Referral Priority: Routine Referral Type: Consultation  Referral Reason: Evaluate and Return  Number of Visits Requested: 1 Expiration Date: 06/08/25   Regular Diet   Notify Physician for change in color/consistency of sputum   Notify Physician for trouble breathing or symptoms that are worse   Activity as tolerated   Discharge instructions  Order Comments: -Follow-up with primary care provider at [facility] in 1 week for posthospital checkup.  If symptoms not better they may consider a prednisone trial again. -Please follow-up with pulmonary doctor at [facility].  You will need to reschedule this appointment.  They will need to monitor to make sure pneumonia has cleared. -Continue to hold off the Arnuity Ellipta  inhaler until you recover from this pneumonia and follow-up with pulmonary provider for review of PFTs and plan.  Okay to continue to take albuterol  inhaler as needed. -Continue to follow-up with hematology team here.  You have a PET scan in 1 week.  They may consider further IVIG infusions as an outpatient.  They report it is okay to restart your Calquence. -Continue Zyrtec trial for allergies and congestion for the next month. -Take Mucinex D for 5 more days for sinus congestion -Take antibiotic called Levaquin for 5 more days to  treat the pneumonia and ear infection.  Next dose due on Monday morning.     Disposition:     Home Consults:     Malignant hematology  Followup appointments:  Future Appointments  Date Time Provider Department Center  12/17/2024  9:30 AM [facility] PET RM 1 [facility] PET SCAN [place]  12/27/2024  1:45 PM Rollo CHRISTELLA Holmes, MD [facility]     Appointment(s) requested via Answering service.  Time spent in discharge process:   35 min   Electronically signed: Lonni JINNY Ellen, MD 12/12/2024 / 8:06 AM  *Some images could not be shown.

## 2024-12-13 ENCOUNTER — Telehealth: Payer: Self-pay

## 2024-12-13 ENCOUNTER — Telehealth: Payer: Self-pay | Admitting: Family Medicine

## 2024-12-13 NOTE — Transitions of Care (Post Inpatient/ED Visit) (Signed)
° °  12/13/2024  Name: RENELL COAXUM MRN: [id number] DOB: Mar 23, 1951  Today's TOC FU Call Status: Today's TOC FU Call Status:: Unsuccessful Call (2nd Attempt) Unsuccessful Call (2nd Attempt) Date: 12/13/24 (returned patients VM.)  Attempted to reach the patient regarding the most recent Inpatient/ED visit.  Follow Up Plan: Additional outreach attempts will be made to reach the patient to complete the Transitions of Care (Post Inpatient/ED visit) call.   Alan Ee, RN, BSN, CEN [employer]- Transition of Care Team.  [facility] [phone]

## 2024-12-13 NOTE — Telephone Encounter (Signed)
 Patient has been seen here for a cortisone shot for her ankle and also started getting them in her knee as well due to arthritis. In her right knee she is thinking that it is a tendon that goes across her knee and to the right and snaps. She states she is due for another shot In January. She was released from the hospital yesterday for Pneumonia and has tried various antibiotics and the ER has helped find one that has knocked it out. The medication is Levofloxacin 75 mg tablets. She has taken two in the hospital and is supposed to start taking it again at 9:30 this morning. She has been reading the side effects of the antibiotic and states that it talks about deterioration of the tendons. She wants to ask if [name] thinks it is okay to take this medication with the problems she been having as she thinks it is her tendon in her knee. Before she goes too far with this antibiotic she would like to know if she should continue it. She states that they took a lot of cultures in the hospital and could not figure out what type of pneumonia she had.  Patient had a lot of questions and a lot of information. Please give patient a call as she is concerned about taking the medication at 9:30. She was not sure if [name] would be able to advise on this.

## 2024-12-13 NOTE — Transitions of Care (Post Inpatient/ED Visit) (Signed)
° °  12/13/2024  Name: Paula [name] MRN: [id number] DOB: [birth year]  Today's TOC FU Call Status: Today's TOC FU Call Status:: Unsuccessful Call (1st Attempt) Unsuccessful Call (1st Attempt) Date: 12/13/24  Attempted to reach the patient regarding the most recent Inpatient/ED visit.  Follow Up Plan: Additional outreach attempts will be made to reach the patient to complete the Transitions of Care (Post Inpatient/ED visit) call.   Alan Ee, RN, BSN, CEN [employer]- Transition of Care Team.  [facility] [phone]

## 2024-12-13 NOTE — Telephone Encounter (Signed)
 Forwarding to Dr. Denyse Amass to review and advise.

## 2024-12-14 ENCOUNTER — Telehealth: Payer: Self-pay

## 2024-12-14 NOTE — Transitions of Care (Post Inpatient/ED Visit) (Signed)
° °  12/14/2024  Name: BONETTA MOSTEK MRN: [id number] DOB: Jan 24, 1951  Today's TOC FU Call Status: Today's TOC FU Call Status:: Unsuccessful Call (3rd Attempt) Unsuccessful Call (3rd Attempt) Date: 12/14/24  Attempted to reach the patient regarding the most recent Inpatient/ED visit.  Follow Up Plan: No further outreach attempts will be made at this time. We have been unable to contact the patient. Alan Ee, RN, BSN, CEN [employer]- Transition of Care Team.  [facility] [phone]

## 2024-12-14 NOTE — Telephone Encounter (Signed)
 Levofloxacin can hurt the tendons however if you are having pneumonia and that is the antibiotic that works against the area you do have that is what you have to do is pneumonia is deadly if not treated appropriately.

## 2024-12-14 NOTE — Telephone Encounter (Signed)
 Called pt and advised per Dr. Denyse Amass. Pt verbalized understanding.

## 2024-12-15 ENCOUNTER — Other Ambulatory Visit: Payer: Self-pay

## 2024-12-15 ENCOUNTER — Encounter: Payer: Self-pay | Admitting: Pulmonary Disease

## 2024-12-15 ENCOUNTER — Ambulatory Visit: Admitting: Pulmonary Disease

## 2024-12-15 ENCOUNTER — Ambulatory Visit
Admission: RE | Admit: 2024-12-15 | Discharge: 2024-12-15 | Disposition: A | Discharge status: Home / Self Care | Payer: Self-pay | Source: Ambulatory Visit | Attending: Pulmonary Disease | Admitting: Pulmonary Disease

## 2024-12-15 VITALS — BP 110/72 | HR 100 | Temp 98.4°F | Ht 66.0 in | Wt 154.8 lb

## 2024-12-15 DIAGNOSIS — Z9289 Personal history of other medical treatment: Secondary | ICD-10-CM

## 2024-12-15 DIAGNOSIS — J189 Pneumonia, unspecified organism: Secondary | ICD-10-CM

## 2024-12-15 DIAGNOSIS — J4530 Mild persistent asthma, uncomplicated: Secondary | ICD-10-CM | POA: Diagnosis not present

## 2024-12-15 DIAGNOSIS — J181 Lobar pneumonia, unspecified organism: Secondary | ICD-10-CM

## 2024-12-15 DIAGNOSIS — Z856 Personal history of leukemia: Secondary | ICD-10-CM

## 2024-12-15 DIAGNOSIS — Z8579 Personal history of other malignant neoplasms of lymphoid, hematopoietic and related tissues: Secondary | ICD-10-CM

## 2024-12-15 DIAGNOSIS — Z8572 Personal history of non-Hodgkin lymphomas: Secondary | ICD-10-CM | POA: Diagnosis not present

## 2024-12-15 NOTE — Progress Notes (Signed)
 Synopsis: Referred in by Ozell Heron HERO, MD   Subjective:   PATIENT ID: Paula [name] Angles GENDER: female DOB: [birth year], MRN: [id number]  Chief Complaint  Patient presents with   Consult    ED on 12/11 for pneumonia. Slight SOB. No wheezing. Cough with clear sputum but it is better. Albuterol - PRN     HPI 73 year old female patient with a past medical history of non-Hodgkin lymphoma, CLL and Waldenstrm's macroglobulinemia, recent diagnosis of intermittent asthma started on Arnuity presenting today to the pulmonary clinic for an acute visit.  She was at a cruise to [place] unfortunately course complicated by community-acquired pneumonia without any significant response despite outpatient 4 rounds of oral antibiotics and steroids.  She got home last week and given the lack of improvement she presented to the emergency department [facility].  She received 3 days of IV antibiotics with Zosyn however was discharged home  to complete a course of levofloxacin.  She underwent a CT chest that showed patchy right lower lobe airspace consolidative opacities most consistent with an infectious process.  No infectious organism was isolated.  She tells me today that this is the first time she feels better, has been afebrile and regaining her energy slowly.  She does also report pansinus disease for which she is waiting to see ENT.     Family History  Problem Relation Age of Onset   Hypertension Mother    Hypertension Brother    Colon cancer Father 13   Hypertension Son    Cancer Neg Hx        lung ca - mother's side   Arthritis Neg Hx        osteo - mother's side   Breast cancer Neg Hx      Social History   Socioeconomic History   Marital status: Married    Spouse name: Not on file   Number of children: Not on file   Years of education: Not on file   Highest education level: Bachelor's degree (e.g., BA, AB, BS)  Occupational History   Not on file  Tobacco Use   Smoking  status: Never   Smokeless tobacco: Never  Substance and Sexual Activity   Alcohol use: Yes    Alcohol/week: 6.0 standard drinks of alcohol    Types: 6 Glasses of wine per week   Drug use: No   Sexual activity: Not on file  Other Topics Concern   Not on file  Social History Narrative   Work or School: IT at [employer] Situation:      Spiritual Beliefs:      Lifestyle:            Social Drivers of Health   Tobacco Use: Low Risk (12/15/2024)   Patient History    Smoking Tobacco Use: Never    Smokeless Tobacco Use: Never    Passive Exposure: Not on file  Financial Resource Strain: Patient Declined (09/02/2024)   Overall Financial Resource Strain (CARDIA)    Difficulty of Paying Living Expenses: Patient declined  Food Insecurity: No Food Insecurity (12/09/2024)   Received from [facility]   Epic    Within the past 12 months, you worried that your food would run out before you got the money to buy more.: Never true    Within the past 12 months, the food you bought just didn't last and you didn't have money to get more.: Never true  Transportation Needs: No Transportation Needs (  12/12/2024)   Received from [facility]    In the past 12 months, has lack of transportation kept you from medical appointments or from getting medications?: No    In the past 12 months, has lack of transportation kept you from meetings, work, or from getting things needed for daily living?: No  Physical Activity: Unknown (09/02/2024)   Exercise Vital Sign    Days of Exercise per Week: Patient declined    Minutes of Exercise per Session: Not on file  Stress: No Stress Concern Present (12/09/2024)   Received from [facility] - Occupational Stress Questionnaire    Do you feel stress - tense, restless, nervous, or anxious, or unable to sleep at night because your mind is troubled all the time - these days?: Only a little  Social Connections:  Unknown (09/02/2024)   Social Connection and Isolation Panel    Frequency of Communication with Friends and Family: Patient declined    Frequency of Social Gatherings with Friends and Family: Patient declined    Attends Religious Services: Patient declined    Database Administrator or Organizations: Patient declined    Attends Banker Meetings: Not on file    Marital Status: Married  Intimate Partner Violence: Not At Risk (12/09/2024)   Received from [facility]   HITS    Over the last 12 months how often did your partner physically hurt you?: Never    Over the last 12 months how often did your partner insult you or talk down to you?: Never    Over the last 12 months how often did your partner threaten you with physical harm?: Never    Over the last 12 months how often did your partner scream or curse at you?: Never  Depression (PHQ2-9): Low Risk (03/31/2024)   Depression (PHQ2-9)    PHQ-2 Score: 0  Alcohol Screen: Low Risk (09/02/2024)   Alcohol Screen    Last Alcohol Screening Score (AUDIT): 0  Housing: Low Risk (12/09/2024)   Received from [facility]    In the last 12 months, was there a time when you were not able to pay the mortgage or rent on time?: No    In the past 12 months, how many times have you moved where you were living?: 0    At any time in the past 12 months, were you homeless or living in a shelter (including now)?: No  Utilities: Not At Risk (12/09/2024)   Received from [facility]    In the past 12 months has the electric, gas, oil, or water company threatened to shut off services in your home?: No  Health Literacy: Adequate Health Literacy (03/31/2024)   B1300 Health Literacy    Frequency of need for help with medical instructions: Never        Objective:   Vitals:   12/15/24 1511  BP: 110/72  Pulse: 100  Temp: 98.4 F (36.9 C)  SpO2: 98%  Weight: 154 lb 12.8 oz (70.2 kg)  Height: 5' 6 (1.676 m)   98% on RA BMI  Readings from Last 3 Encounters:  12/15/24 24.99 kg/m  12/06/24 25.70 kg/m  10/07/24 26.31 kg/m   Wt Readings from Last 3 Encounters:  12/15/24 154 lb 12.8 oz (70.2 kg)  12/06/24 159 lb 3.2 oz (72.2 kg)  10/07/24 163 lb (73.9 kg)    Physical Exam GEN: NAD, Healthy Appearing  HEENT: Supple Neck, Reactive Pupils, EOMI  CVS: Normal S1, Normal S2, RRR, No murmurs or ES appreciated  Lungs: Clear bilateral air entry.  Abdomen: Soft, non tender, non distended, + BS  Extremities: Warm and well perfused, No edema   Labs and imaging were reviewed.   Ancillary Information   CBC    Component Value Date/Time   WBC 25.1 (H) 04/06/2023 0603   RBC 3.15 (L) 04/06/2023 0603   HGB 10.2 (L) 04/06/2023 0603   HCT 31.6 (L) 04/06/2023 0603   PLT 113 (L) 04/06/2023 0603   MCV 100.3 (H) 04/06/2023 0603   MCH 32.4 04/06/2023 0603   MCHC 32.3 04/06/2023 0603   RDW 16.4 (H) 04/06/2023 0603   LYMPHSABS 19.5 (H) 04/06/2023 0603   MONOABS 2.8 (H) 04/06/2023 0603   EOSABS 0.1 04/06/2023 0603   BASOSABS 0.1 04/06/2023 0603       Latest Ref Rng & Units 09/13/2024    2:54 PM  PFT Results  FVC-Pre L 2.83   FVC-Predicted Pre % 91   FVC-Post L 2.95   FVC-Predicted Post % 95   Pre FEV1/FVC % % 73   Post FEV1/FCV % % 73   FEV1-Pre L 2.06   FEV1-Predicted Pre % 88   FEV1-Post L 2.16   DLCO uncorrected ml/min/mmHg 19.18   DLCO UNC% % 94   DLVA Predicted % 95   TLC L 5.64   TLC % Predicted % 105   RV % Predicted % 112      Assessment & Plan:  73 year old female patient with a past medical history of non-Hodgkin lymphoma, CLL and Waldenstrm's macroglobulinemia, recent diagnosis of intermittent asthma started on Arnuity presenting today to the pulmonary clinic for an acute visit.  #CAP  #Mild persistent asthma  - CTA chest at [facility] with consolidative RLL Opacities - Improving on levofloxaxcin [2 more days to end course]  - Advised on resuming home Arnuity and albuterol  as needed  - Will  order a CT chest in 8 weeks and a follow up with Dr. Tamea.   Return in about 8 weeks (around 02/09/2025) for WITH THE CT CHEST .  I personally spent a total of 40 minutes in the care of the patient today including preparing to see the patient, getting/reviewing separately obtained history, performing a medically appropriate exam/evaluation, counseling and educating, placing orders, documenting clinical information in the EHR, independently interpreting results, and communicating results.   Darrin Barn, MD [facility] 12/15/2024 5:25 PM

## 2024-12-17 ENCOUNTER — Ambulatory Visit: Admitting: Family Medicine

## 2024-12-17 ENCOUNTER — Encounter: Payer: Self-pay | Admitting: Family Medicine

## 2024-12-17 VITALS — BP 130/80 | HR 100 | Temp 97.7°F | Ht 66.0 in | Wt 156.5 lb

## 2024-12-17 DIAGNOSIS — J189 Pneumonia, unspecified organism: Secondary | ICD-10-CM | POA: Diagnosis not present

## 2024-12-17 NOTE — Progress Notes (Unsigned)
" ° °  Established Patient Office Visit  Subjective   Patient ID: Paula [name], female    DOB: Jan 27, 1951  Age: 73 y.o. MRN: [id number]  Chief Complaint  Patient presents with   Hospitalization Follow-up    HPI   Current Outpatient Medications  Medication Instructions   albuterol  (VENTOLIN  HFA) 108 (90 Base) MCG/ACT inhaler 2 puffs, Inhalation, Every 6 hours PRN   CALQUENCE 100 MG tablet Take by mouth.   cetirizine (ZYRTEC) 10 mg, Daily   dextromethorphan-guaiFENesin (MUCINEX DM) 30-600 MG 12hr tablet 1 tablet, 2 times daily   Fluticasone  Furoate (ARNUITY ELLIPTA ) 100 MCG/ACT AEPB 1 puff, Inhalation, Daily   ondansetron  (ZOFRAN -ODT) 4 mg, Oral, Every 8 hours PRN   valACYclovir (VALTREX) 1000 MG tablet Take by mouth.    Patient Active Problem List   Diagnosis Date Noted   Synovial cyst of wrist, right 10/18/2022   Arthritis of right ankle 11/28/2021   Patellofemoral arthritis of right knee 01/18/2020   Cold sore 01/18/2020   CLL (chronic lymphocytic leukemia) (HCC) 03/16/2018   Vaccine contraindicated - live vaccines contraindicated 04/20/2014   Waldenstroms macroglobulinemia - followed by Dr. Eyvonne, [place], [facility] 03/15/2014   Chronic cough - seeing pulmonologist in [place], [facility] 03/14/2014   Disorder of bone and cartilage 12/05/2008     Review of Systems  All other systems reviewed and are negative.     Objective:     BP 130/80   Pulse 100   Temp 97.7 F (36.5 C) (Oral)   Ht 5' 6 (1.676 m)   Wt 156 lb 8 oz (71 kg)   SpO2 99%   BMI 25.26 kg/m     Physical Exam Vitals reviewed.  Constitutional:      Appearance: Normal appearance. She is normal weight.  Cardiovascular:     Rate and Rhythm: Normal rate and regular rhythm.     Heart sounds: Normal heart sounds. No murmur heard. Pulmonary:     Effort: Pulmonary effort is normal.  Neurological:     Mental Status: She is alert.      No results found for any visits  on 12/17/24.     The 10-year ASCVD risk score (Arnett DK, et al., 2019) is: 16.2%    Assessment & Plan:  Pneumonia of right middle lobe due to infectious organism     No follow-ups on file.    Heron CHRISTELLA Sharper, MD "

## 2024-12-31 ENCOUNTER — Other Ambulatory Visit: Payer: Self-pay | Admitting: Internal Medicine

## 2024-12-31 DIAGNOSIS — Z1231 Encounter for screening mammogram for malignant neoplasm of breast: Secondary | ICD-10-CM

## 2025-01-03 ENCOUNTER — Ambulatory Visit: Admitting: Family Medicine

## 2025-01-03 ENCOUNTER — Other Ambulatory Visit: Payer: Self-pay

## 2025-01-03 VITALS — BP 124/70 | HR 84 | Ht 66.0 in | Wt 158.0 lb

## 2025-01-03 DIAGNOSIS — M19071 Primary osteoarthritis, right ankle and foot: Secondary | ICD-10-CM

## 2025-01-03 DIAGNOSIS — G8929 Other chronic pain: Secondary | ICD-10-CM

## 2025-01-03 DIAGNOSIS — M25561 Pain in right knee: Secondary | ICD-10-CM

## 2025-01-03 DIAGNOSIS — M25571 Pain in right ankle and joints of right foot: Secondary | ICD-10-CM | POA: Diagnosis not present

## 2025-01-03 DIAGNOSIS — M1711 Unilateral primary osteoarthritis, right knee: Secondary | ICD-10-CM | POA: Diagnosis not present

## 2025-01-03 NOTE — Patient Instructions (Addendum)
 Thank you for coming in today.   You received an injection today. Seek immediate medical attention if the joint becomes red, extremely painful, or is oozing fluid.   Handicap placard provided  Check back in 3 months

## 2025-01-03 NOTE — Progress Notes (Signed)
 "        I, Claretha Schimke am a scribe for Dr. Artist Lloyd, MD.  Paula [name] is a 74 y.o. female who presents to [facility] today for exacerbation of her R knee and R ankle pain. Pt was last seen by Dr. Lloyd on 10/07/24 and was given a R knee and R lateral ankle steroid injections and advised to use meloxicam  sparingly.  Today, pt reports that she wants to get injections in right knee and ankle today. Would like a handicap placard. Wants to talk about the orthotics that she currently has in her shoes from 2 years ago.   Dx imaging: 07/12/24 R knee XR  02/23/22 R ankle & tib/fib MRI  Pertinent review of systems: No fevers or chills  Relevant historical information: Ankle arthritis CLL  Exam:  BP 124/70   Pulse 84   Ht 5' 6 (1.676 m)   Wt 158 lb (71.7 kg)   SpO2 98%   BMI 25.50 kg/m  General: Well Developed, well nourished, and in no acute distress.   MSK: Right knee minimal effusion.  Normal motion. Right ankle collapse of arch with lateral compression.  Normal motion.   Lab and Radiology Results  Procedure: Real-time Ultrasound Guided Injection of right knee joint superior lateral patella space Device: Philips Affiniti 50G/GE Logiq Images permanently stored and available for review in PACS Verbal informed consent obtained.  Discussed risks and benefits of procedure. Warned about infection, bleeding, hyperglycemia damage to structures among others. Patient expresses understanding and agreement Time-out conducted.   Noted no overlying erythema, induration, or other signs of local infection.   Skin prepped in a sterile fashion.   Local anesthesia: Topical Ethyl chloride.   With sterile technique and under real time ultrasound guidance: 40 mg of Kenalog  and 2 mL of Marcaine injected into knee joint. Fluid seen entering the joint capsule.   Completed without difficulty   Pain immediately resolved suggesting accurate placement of the medication.    Advised to call if fevers/chills, erythema, induration, drainage, or persistent bleeding.   Images permanently stored and available for review in the ultrasound unit.  Impression: Technically successful ultrasound guided injection.       Procedure: Real-time Ultrasound Guided Injection of right ankle lateral approach Device: Philips Affiniti 50G/GE Logiq Images permanently stored and available for review in PACS Verbal informed consent obtained.  Discussed risks and benefits of procedure. Warned about infection, bleeding, hyperglycemia damage to structures among others. Patient expresses understanding and agreement Time-out conducted.   Noted no overlying erythema, induration, or other signs of local infection.   Skin prepped in a sterile fashion.   Local anesthesia: Topical Ethyl chloride.   With sterile technique and under real time ultrasound guidance: 40 mg of Kenalog  and 2 mL of Marcaine injected into ankle joint. Fluid seen entering the joint capsule.   Completed without difficulty   Pain immediately resolved suggesting accurate placement of the medication.   Advised to call if fevers/chills, erythema, induration, drainage, or persistent bleeding.   Images permanently stored and available for review in the ultrasound unit.  Impression: Technically successful ultrasound guided injection.         Assessment and Plan: 74 y.o. female with right knee and ankle pain due to DJD.  Plan for injection today.  Check back as needed anticipate recheck probably in about 3 months.  Orthotics inspected to look okay to me.  Okay to keep using them if needed we  could get him remade at sports medicine.  Handicap form filled out today. PDMP not reviewed this encounter. Orders Placed This Encounter  Procedures   US  LIMITED JOINT SPACE STRUCTURES LOW RIGHT(NO LINKED CHARGES)    Reason for Exam (SYMPTOM  OR DIAGNOSIS REQUIRED):   knee pain    Preferred imaging location?:   [facility]   No orders of the defined types were placed in this encounter.    Discussed warning signs or symptoms. Please see discharge instructions. Patient expresses understanding.   The above documentation has been reviewed and is accurate and complete Artist Lloyd, M.D.   "

## 2025-01-04 ENCOUNTER — Encounter (INDEPENDENT_AMBULATORY_CARE_PROVIDER_SITE_OTHER): Payer: Self-pay | Admitting: Physician Assistant

## 2025-01-04 ENCOUNTER — Ambulatory Visit (INDEPENDENT_AMBULATORY_CARE_PROVIDER_SITE_OTHER): Admitting: Physician Assistant

## 2025-01-04 VITALS — BP 137/72 | HR 75 | Ht 64.25 in | Wt 156.0 lb

## 2025-01-04 DIAGNOSIS — H6992 Unspecified Eustachian tube disorder, left ear: Secondary | ICD-10-CM

## 2025-01-04 DIAGNOSIS — H90A12 Conductive hearing loss, unilateral, left ear with restricted hearing on the contralateral side: Secondary | ICD-10-CM

## 2025-01-04 DIAGNOSIS — H9192 Unspecified hearing loss, left ear: Secondary | ICD-10-CM | POA: Diagnosis not present

## 2025-01-04 MED ORDER — CETIRIZINE HCL 10 MG PO TABS: 10.0000 mg | ORAL_TABLET | Freq: Every day | ORAL | 11 refills | Status: AC

## 2025-01-04 MED ORDER — FLUTICASONE PROPIONATE 50 MCG/ACT NA SUSP: 2.0000 | Freq: Every day | NASAL | 6 refills | Status: AC

## 2025-01-04 NOTE — Progress Notes (Signed)
 Dear Dr. Ozell, Here is my assessment for our mutual patient, Paula [name]. Thank you for allowing me the opportunity to care for your patient. Please do not hesitate to contact me should you have any other questions. Sincerely, Chyrl Cohen PA-C  Otolaryngology Clinic Note Referring provider: Dr. Ozell HPI:  Paula [name] is a 74 y.o. female kindly referred by Dr. Ozell   Discussed the use of AI scribe software for clinical note transcription with the patient, who gave verbal consent to proceed.  History of Present Illness    Paula [name] is a 74 year old female who presents with persistent left-sided ear fullness and hearing loss following a recent respiratory infection.  Since mid-November 2025, she has experienced persistent left-sided ear fullness, blockage, and significantly muffled hearing. She describes her hearing as 'very muffled' and is unable to hear the television out of the left ear when lying on her pillow. She is able to 'pop' the ear, but this does not improve her hearing. She denies current otalgia but had initial pain that improved with ear drops. She has not had prior ear infections, and her right ear hearing remains normal.  Her symptoms began during a significant upper and lower respiratory infection while traveling in [place]. She underwent audiometric testing, which demonstrated diminished hearing in the left ear, and she reports that she was told she had middle ear effusion. She was evaluated at urgent care three times and prescribed multiple antibiotics, including amoxicillin , which did not improve her symptoms and caused nausea. She has not worn her hearing aids recently.  She began a 31-day course of Zyrtec  on November 29, 2024, and is currently two and a half weeks into the regimen. She has intermittently used Flonase  nasal spray but has not used it for the past two weeks. She performs saline sinus irrigation and uses a daily inhaler. She denies seasonal  allergies.  She postponed her return flight to the [place]  due to concerns about her ear but ultimately flew home on December 07, 2024, without experiencing any ear-related complications during the flights.           Independent Review of Additional Tests or Records:  PCP office visit note 12/17/2024   PMH/Meds/All/SocHx/FamHx/ROS:   Past Medical History:  Diagnosis Date   CARPAL TUNNEL SYNDROME, MILD 12/05/2008   Chronic cough - seeing pulmonologist in [place], [facility] 03/14/2014   HSV 03/01/2010   Leg skin lesion, left 01/30/2015   precancerous cells per patient   OSTEOPENIA 12/05/2008   PARESTHESIA 09/01/2009   Waldenstroms macroglobulinemia - followed by Dr. Eyvonne [place], [facility] 03/15/2014     Past Surgical History:  Procedure Laterality Date   CHOLECYSTECTOMY  02/2017   LEG SKIN LESION  BIOPSY / EXCISION Left 01/30/2015   Dr Darice Gould-precancerous cells per patient   ORIF TIBIA & FIBULA FRACTURES Right 1988   TONSILLECTOMY AND ADENOIDECTOMY  1958    Family History  Problem Relation Age of Onset   Hypertension Mother    Hypertension Brother    Colon cancer Father 31   Hypertension Son    Cancer Neg Hx        lung ca - mother's side   Arthritis Neg Hx        osteo - mother's side   Breast cancer Neg Hx      Social Connections: Moderately Integrated (01/01/2025)   Received from [facility]   Social Network    How would you rate your  social network (family, work, friends)?: Adequate participation with social networks     Current Medications[1]   Physical Exam:   BP 137/72   Pulse 75   Ht 5' 4.25 (1.632 m)   Wt 156 lb (70.8 kg)   SpO2 96%   BMI 26.57 kg/m   Pertinent Findings  CN II-XII grossly intact Bilateral EAC clear and TM intact with well pneumatized middle ear spaces Weber 512: equal Rinne 512: AC > BC b/l  Anterior rhinoscopy: Septum midline; bilateral inferior turbinates with no hypertrophy No lesions of  oral cavity/oropharynx No obviously palpable neck masses/lymphadenopathy/thyromegaly No respiratory distress or stridor   Seprately Identifiable Procedures:  None  Impression & Plans:  Paula [name] is a 74 y.o. female with the following   Assessment and Plan  Decreased hearing left ear-  Likely status post recent URI.  No obvious effusion on exam.  Symptoms seem to be improving but are persistent.  Will get audiological evaluation and call the patient with the results once available.  If effusion is noted on audiological evaluation she will need nasal endoscopy.  Eustachian tube dysfunction with possible middle ear effusion, left ear Symptoms consistent with post-infectious Eustachian tube dysfunction and possible middle ear effusion. No evidence of neoplasm or obstructive pathology. No indication for further antibiotics. - Prescribed daily intranasal corticosteroid (Flonase ) to reduce inflammation and promote Eustachian tube function. - Advised continuation of daily oral antihistamine (cetirizine  or loratadine) until further notice. - Recommended twice daily saline nasal irrigation to assist with sinus and Eustachian tube clearance. - Instructed her to continue this regimen until follow-up or for up to three months if symptoms persist. - If repeat audiogram and tympanometry demonstrate persistent unilateral effusion, plan for nasal endoscopy to visualize the Eustachian tube orifice and rule out obstructive pathology. - Provided anticipatory guidance that symptoms may take several months to resolve and that a sensation of aural fullness may persist even after objective improvement.           - f/u phone call visit with audiological results   Thank you for allowing me the opportunity to care for your patient. Please do not hesitate to contact me should you have any other questions.  Sincerely, Chyrl Cohen PA-C [facility] Phone: [phone] Fax:  [phone]  01/04/2025, 12:05 PM        [1]  Current Outpatient Medications:    albuterol  (VENTOLIN  HFA) 108 (90 Base) MCG/ACT inhaler, Inhale 2 puffs into the lungs every 6 (six) hours as needed for wheezing or shortness of breath. (Patient taking differently: Inhale 2 puffs into the lungs every 6 (six) hours as needed for wheezing or shortness of breath. Taking one puff daily.), Disp: 8 g, Rfl: 2   CALQUENCE 100 MG tablet, Take by mouth., Disp: , Rfl:    cetirizine  (ZYRTEC ) 10 MG tablet, Take 10 mg by mouth daily., Disp: , Rfl:    cetirizine  (ZYRTEC ) 10 MG tablet, Take 1 tablet (10 mg total) by mouth daily., Disp: 30 tablet, Rfl: 11   fluticasone  (FLONASE ) 50 MCG/ACT nasal spray, Place 2 sprays into both nostrils daily., Disp: 48 g, Rfl: 6   Fluticasone  Furoate (ARNUITY ELLIPTA ) 100 MCG/ACT AEPB, Inhale 1 puff into the lungs daily. (Patient taking differently: Inhale 1 puff into the lungs daily. Taking as needed), Disp: 30 each, Rfl: 3   Turmeric (QC TUMERIC COMPLEX PO), Take 1 capsule by mouth daily., Disp: , Rfl:    ondansetron  (ZOFRAN -ODT) 4 MG disintegrating tablet, Take 1 tablet (  4 mg total) by mouth every 8 (eight) hours as needed for nausea or vomiting. (Patient not taking: Reported on 01/04/2025), Disp: 20 tablet, Rfl: 2   valACYclovir (VALTREX) 1000 MG tablet, Take by mouth. (Patient not taking: Reported on 01/04/2025), Disp: , Rfl:

## 2025-01-04 NOTE — Progress Notes (Signed)
 "  Neurosurgical Outpatient Visit for Paula [name] on 01/04/2025   DOB: 1951-05-15 MRN: [id number]  PCP: Chiquita Cramp, DPM Referring Provider: Viktoria Rollo HERO, MD  Diagnosis:  1. Pituitary adenoma    HISTORY: Paula [name] is a 74 y.o. female with a history of follicular lymphoma diagnosed in 2023 and chronic lymphocytic leukemia.  She is currently undergoing treatment of CLL and she underwent a routine monitoring PET scan which demonstrated increased activity in the sella.  She subsequently was recommended to undergo an MRI which revealed the presence of a likely pituitary adenoma.  She was subsequently referred to neurosurgery for further evaluation.  She has no specific complaints at this time.  She complains of some very general constitutional symptoms like increased fatigue which she attributes to her recent hospitalization for pneumonia.  She denies headache, visual disturbances, weight gain or weight loss, breast discharge.  General ROS: Negative unless otherwise stated in HPI.  PHYSICAL EXAM: VITALS: BP 135/65 (BP Location: Right Upper Arm, Patient Position: Sitting)   Pulse 77   Temp 97.7 F (36.5 C) (Temporal)   Resp 14   Ht 5' 5 (1.651 m)   Wt 160 lb (72.6 kg)   SpO2 98%   BMI 26.63 kg/m  BMI Readings from Last 1 Encounters:  01/04/25 26.63 kg/m    Constitutional: Appears well-developed and well-nourished. Does not appear distressed, does not appear ill and no respiratory distress. Not diaphoretic. HENT:  Head: Normocephalic and atraumatic.  Eyes: EOM are intact. Pupils are equal, round, and reactive to light.  Neck: Normal range of motion.  Cardiovascular: Normal rate, regular rhythm and normal heart sounds.  Pulmonary/Chest: No respiratory distress. Respiratory effort normal and breath sounds normal.  Musculoskeletal: Normal range of motion.   Neurological: Alert and oriented with normal speech fluency, naming and comprehension.   Extraocular  movements intact.  Face symmetric.   Strength in the upper and lower extremities is normal and symmetric.   No sensory deficit or neglect.    Hearing loss left ear  IMAGING: I personally reviewed this/these film(s) and radiology report(s), with my interpretation as listed below. MRI performed on December 27, 2024 reviewed which demonstrates a homogenously enhancing well-circumscribed mass within the sella measuring approximately 1.2 cm with minimal encroachment on the optic apparatus.   IMPRESSION: 74 year old female found to have an incidental pituitary mass.  I reviewed the imaging and informed her that the characteristics of this mass seem to be consistent with a pituitary adenoma rather than a manifestation of her lymphoma diagnosis.  Therefore we will opt for conservative management.  PLAN: Repeat MRI sella with and without contrast in 3 months (April 2026) Follow-up in the office after imaging is complete Risks, benefits, and alternatives of the medications and treatment plan prescribed today were discussed, and patient expressed understanding. Plan follow-up as discussed or as needed if any worsening symptoms or change in condition.   Follow up in: 3 months  Orders Placed This Encounter  Procedures   MRI Pituitary WO W Contrast    Standing Status:   Future    Expected Date:   04/04/2025    Expiration Date:   01/04/2026    Does patient have implant or device: cardiac pacemaker, cochlear implant, insulin pump, neurostimulation system, aneurysm clip?:   No    Perform POC Creatinine if indicated?:   Yes    Reason for Exam:   pituitary adenoma      Additional Elements of the History and Physical Listed  Below Outside and prior records: Independently reviewed and summarized as above and included as relevant in the appropriate sections of this note.  History provided by: Patient Language interpreter used: No Past Medical History, Past Surgery History, Allergies, Social History, and  Family History were reviewed and updated at today's visit.  Past Medical History:  Diagnosis Date   Breast nodule 01/22/2018   Carpal tunnel syndrome    CLL (chronic lymphocytic leukemia) (*) 01/02/2018   Non Hodgkin's lymphoma (*) 2015   Osteopenia    Rosacea    Past Surgical History:  Procedure Laterality Date   Cholecystectomy     Orif tibia & fibula fractures Right    Family History[1] Social History[2] Allergies[3] Medications Ordered Prior to Encounter[4]  (Please note voice recognition software has been used to dictate this note. Similar sounding words can inadvertently be transcribed and may not be corrected upon review).  Electronically Signed By: Franky DELENA Fairly, MD .01/04/2025 // 2:17 PM       [1] Family History Problem Relation Name Age of Onset   Cancer Father  57        colon and lung   Diabetes Maternal Grandmother     Emphysema Paternal Aunt     Stroke Father    [2] Social History Socioeconomic History   Marital status: Married  Occupational History   Occupation: research scientist (medical)    Comment: full time  Tobacco Use   Smoking status: Never   Smokeless tobacco: Never  Vaping Use   Vaping status: Never Used  Substance and Sexual Activity   Alcohol use: Yes    Comment: 2 drinks/week   Drug use: Not Currently   Sexual activity: Yes    Partners: Male  [3] Allergies Allergen Reactions   Amoxicillin  Nausea And Vomiting   Ciprofloxacin  Nausea Only and Other    REACTION: nausea  [4] Current Outpatient Medications on File Prior to Visit  Medication Sig Dispense Refill   acalabrutinib maleate (CALQUENCE) 100 mg TABS tablet Take 1 tablet (100 mg dose) by mouth 2  times daily. 60 tablet 0   albuterol  sulfate HFA (PROVENTIL ,VENTOLIN ,PROAIR ) 108 (90 Base) MCG/ACT inhaler Inhale two puffs into the lungs every 6 (six) hours as needed.     ALPRAZolam (XANAX) 0.5 mg tablet Take one tablet (0.5 mg dose) by mouth 2 (two) times a day as  needed.     [Paused] ARNUITY ELLIPTA  100 MCG/ACT AEPB Inhale one puff into the lungs daily. (Patient not taking: Reported on 01/04/2025)     cetirizine  (ZYRTEC ) 10 mg tablet Take one tablet (10 mg dose) by mouth daily. 30 tablet 0   ciprofloxacin  (CILOXAN ) 0.3% ophthalmic solution one drop daily. Left EAR     ondansetron  (ZOFRAN -ODT) 4 mg disintegrating tablet Take one tablet (4 mg dose) by mouth every 8 (eight) hours as needed.     pseudoephedrine-guaifenesin (DECONSAL,MUCINEX D) 60-600 MG per tablet Take one tablet by mouth every 12 (twelve) hours.     triamcinolone  acetonide (KENALOG ) 0.1% cream one g (1 Application dose).     No current facility-administered medications on file prior to visit.  "

## 2025-01-05 ENCOUNTER — Encounter (INDEPENDENT_AMBULATORY_CARE_PROVIDER_SITE_OTHER): Payer: Self-pay

## 2025-01-06 NOTE — Telephone Encounter (Signed)
 That is a very uncommon but a possibility. She can try the Flonase  again and if she cannot tolerate it then she can continue the oral antihistamine and saline irrigation. I wouldn't recommend oral steroids. Thanks

## 2025-01-10 ENCOUNTER — Ambulatory Visit: Admitting: Family Medicine

## 2025-01-11 ENCOUNTER — Telehealth: Payer: Self-pay | Admitting: Family Medicine

## 2025-01-11 DIAGNOSIS — M19071 Primary osteoarthritis, right ankle and foot: Secondary | ICD-10-CM

## 2025-01-11 DIAGNOSIS — G8929 Other chronic pain: Secondary | ICD-10-CM

## 2025-01-11 NOTE — Telephone Encounter (Signed)
 Pt and [name] discussed Paula [name] seeing Dr. Kit at [facility] about [name]. We have referred in the past but she called to schedule and they need a new referral.  Please send referral to [facility] to eval [name].

## 2025-01-17 ENCOUNTER — Ambulatory Visit: Admitting: Family Medicine

## 2025-01-18 ENCOUNTER — Ambulatory Visit: Admitting: Family Medicine

## 2025-02-11 ENCOUNTER — Ambulatory Visit (INDEPENDENT_AMBULATORY_CARE_PROVIDER_SITE_OTHER): Admitting: Audiology

## 2025-02-11 ENCOUNTER — Ambulatory Visit (INDEPENDENT_AMBULATORY_CARE_PROVIDER_SITE_OTHER): Admitting: Physician Assistant

## 2025-02-14 ENCOUNTER — Ambulatory Visit: Admitting: Pulmonary Disease

## 2025-02-24 ENCOUNTER — Ambulatory Visit: Admitting: Pulmonary Disease

## 2025-03-17 ENCOUNTER — Ambulatory Visit: Admitting: Family Medicine

## 2025-04-04 ENCOUNTER — Ambulatory Visit: Admitting: Family Medicine

## 2025-05-31 ENCOUNTER — Ambulatory Visit
